# Patient Record
Sex: Female | Born: 1960
Health system: Southern US, Community
[De-identification: ages and names within clinical notes are randomized; demographics above are authoritative.]

## PROBLEM LIST (undated history)

## (undated) DIAGNOSIS — M1990 Unspecified osteoarthritis, unspecified site: Secondary | ICD-10-CM

## (undated) DIAGNOSIS — Z5189 Encounter for other specified aftercare: Secondary | ICD-10-CM

## (undated) DIAGNOSIS — N951 Menopausal and female climacteric states: Secondary | ICD-10-CM

## (undated) DIAGNOSIS — K219 Gastro-esophageal reflux disease without esophagitis: Secondary | ICD-10-CM

## (undated) DIAGNOSIS — F329 Major depressive disorder, single episode, unspecified: Secondary | ICD-10-CM

## (undated) DIAGNOSIS — F32A Depression, unspecified: Secondary | ICD-10-CM

## (undated) DIAGNOSIS — C801 Malignant (primary) neoplasm, unspecified: Secondary | ICD-10-CM

## (undated) DIAGNOSIS — G459 Transient cerebral ischemic attack, unspecified: Secondary | ICD-10-CM

## (undated) DIAGNOSIS — A630 Anogenital (venereal) warts: Secondary | ICD-10-CM

## (undated) DIAGNOSIS — R51 Headache: Secondary | ICD-10-CM

## (undated) DIAGNOSIS — K5909 Other constipation: Secondary | ICD-10-CM

## (undated) DIAGNOSIS — R896 Abnormal cytological findings in specimens from other organs, systems and tissues: Secondary | ICD-10-CM

## (undated) DIAGNOSIS — J439 Emphysema, unspecified: Secondary | ICD-10-CM

## (undated) DIAGNOSIS — IMO0001 Reserved for inherently not codable concepts without codable children: Secondary | ICD-10-CM

## (undated) DIAGNOSIS — R918 Other nonspecific abnormal finding of lung field: Secondary | ICD-10-CM

## (undated) DIAGNOSIS — M719 Bursopathy, unspecified: Secondary | ICD-10-CM

## (undated) DIAGNOSIS — B977 Papillomavirus as the cause of diseases classified elsewhere: Secondary | ICD-10-CM

## (undated) DIAGNOSIS — J449 Chronic obstructive pulmonary disease, unspecified: Secondary | ICD-10-CM

## (undated) DIAGNOSIS — T7840XA Allergy, unspecified, initial encounter: Secondary | ICD-10-CM

## (undated) DIAGNOSIS — F419 Anxiety disorder, unspecified: Secondary | ICD-10-CM

## (undated) DIAGNOSIS — Z1501 Genetic susceptibility to malignant neoplasm of breast: Secondary | ICD-10-CM

## (undated) DIAGNOSIS — R569 Unspecified convulsions: Secondary | ICD-10-CM

## (undated) DIAGNOSIS — M8580 Other specified disorders of bone density and structure, unspecified site: Secondary | ICD-10-CM

## (undated) DIAGNOSIS — I639 Cerebral infarction, unspecified: Secondary | ICD-10-CM

## (undated) HISTORY — DX: Malignant (primary) neoplasm, unspecified: C80.1

## (undated) HISTORY — DX: Depression, unspecified: F32.A

## (undated) HISTORY — DX: Papillomavirus as the cause of diseases classified elsewhere: B97.7

## (undated) HISTORY — PX: CHOLECYSTECTOMY: SHX55

## (undated) HISTORY — DX: Bursopathy, unspecified: M71.9

## (undated) HISTORY — DX: Abnormal cytological findings in specimens from other organs, systems and tissues: R89.6

## (undated) HISTORY — DX: Reserved for inherently not codable concepts without codable children: IMO0001

## (undated) HISTORY — PX: ABDOMINOPLASTY: SUR9

## (undated) HISTORY — PX: TOTAL VAGINAL HYSTERECTOMY: SHX2548

## (undated) HISTORY — DX: Gastro-esophageal reflux disease without esophagitis: K21.9

## (undated) HISTORY — DX: Other nonspecific abnormal finding of lung field: R91.8

## (undated) HISTORY — DX: Menopausal and female climacteric states: N95.1

## (undated) HISTORY — PX: ABDOMINAL HYSTERECTOMY: SHX81

## (undated) HISTORY — DX: Anogenital (venereal) warts: A63.0

## (undated) HISTORY — DX: Other constipation: K59.09

## (undated) HISTORY — PX: HERNIA REPAIR: SHX51

## (undated) HISTORY — DX: Allergy, unspecified, initial encounter: T78.40XA

## (undated) HISTORY — DX: Transient cerebral ischemic attack, unspecified: G45.9

## (undated) HISTORY — DX: Other specified disorders of bone density and structure, unspecified site: M85.80

## (undated) HISTORY — DX: Cerebral infarction, unspecified: I63.9

## (undated) HISTORY — DX: Chronic obstructive pulmonary disease, unspecified: J44.9

## (undated) HISTORY — DX: Emphysema, unspecified: J43.9

## (undated) HISTORY — DX: Major depressive disorder, single episode, unspecified: F32.9

## (undated) HISTORY — PX: BREAST SURGERY: SHX581

---
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
## 1981-04-16 DIAGNOSIS — Z5189 Encounter for other specified aftercare: Secondary | ICD-10-CM

## 1981-04-16 DIAGNOSIS — IMO0001 Reserved for inherently not codable concepts without codable children: Secondary | ICD-10-CM

## 1981-04-16 HISTORY — DX: Reserved for inherently not codable concepts without codable children: IMO0001

## 1981-04-16 HISTORY — DX: Encounter for other specified aftercare: Z51.89

## 2003-08-05 ENCOUNTER — Encounter: Payer: Self-pay | Admitting: Family Medicine

## 2004-11-06 ENCOUNTER — Encounter: Payer: Self-pay | Admitting: Family Medicine

## 2005-04-19 ENCOUNTER — Encounter: Payer: Self-pay | Admitting: Family Medicine

## 2007-01-16 ENCOUNTER — Encounter: Payer: Self-pay | Admitting: Family Medicine

## 2007-05-13 ENCOUNTER — Encounter: Payer: Self-pay | Admitting: Family Medicine

## 2007-07-07 ENCOUNTER — Encounter: Payer: Self-pay | Admitting: Family Medicine

## 2007-07-16 ENCOUNTER — Ambulatory Visit (HOSPITAL_COMMUNITY): Payer: Self-pay | Admitting: Psychiatry

## 2007-09-10 ENCOUNTER — Other Ambulatory Visit: Admission: RE | Admit: 2007-09-10 | Discharge: 2007-09-10 | Payer: Self-pay | Admitting: Obstetrics & Gynecology

## 2007-09-10 ENCOUNTER — Ambulatory Visit: Payer: Self-pay | Admitting: Obstetrics & Gynecology

## 2007-09-10 ENCOUNTER — Encounter: Admission: RE | Admit: 2007-09-10 | Discharge: 2007-09-10 | Payer: Self-pay | Admitting: Obstetrics & Gynecology

## 2007-09-10 ENCOUNTER — Encounter: Payer: Self-pay | Admitting: Obstetrics & Gynecology

## 2007-09-19 ENCOUNTER — Encounter: Admission: RE | Admit: 2007-09-19 | Discharge: 2007-09-19 | Payer: Self-pay | Admitting: Obstetrics & Gynecology

## 2007-11-05 ENCOUNTER — Ambulatory Visit: Payer: Self-pay | Admitting: Obstetrics & Gynecology

## 2007-12-16 ENCOUNTER — Ambulatory Visit: Payer: Self-pay | Admitting: Family Medicine

## 2007-12-16 ENCOUNTER — Encounter: Admission: RE | Admit: 2007-12-16 | Discharge: 2007-12-16 | Payer: Self-pay | Admitting: Family Medicine

## 2007-12-16 DIAGNOSIS — E785 Hyperlipidemia, unspecified: Secondary | ICD-10-CM | POA: Insufficient documentation

## 2007-12-16 DIAGNOSIS — R609 Edema, unspecified: Secondary | ICD-10-CM | POA: Insufficient documentation

## 2007-12-16 DIAGNOSIS — M79609 Pain in unspecified limb: Secondary | ICD-10-CM | POA: Insufficient documentation

## 2007-12-17 ENCOUNTER — Ambulatory Visit: Payer: Self-pay | Admitting: Obstetrics & Gynecology

## 2007-12-17 ENCOUNTER — Encounter: Payer: Self-pay | Admitting: Family Medicine

## 2007-12-17 LAB — CONVERTED CEMR LAB
AST: 12 units/L (ref 0–37)
Alkaline Phosphatase: 50 units/L (ref 39–117)
BUN: 11 mg/dL (ref 6–23)
Chloride: 110 meq/L (ref 96–112)
Creatinine, Ser: 0.72 mg/dL (ref 0.40–1.20)
HDL: 52 mg/dL (ref >39)
Hemoglobin: 14.5 g/dL (ref 12.0–15.0)
Potassium: 3.8 meq/L (ref 3.5–5.3)
Pro B Natriuretic peptide (BNP): 15 pg/mL (ref 0.0–100.0)
RBC: 4.34 M/uL (ref 3.87–5.11)
Sed Rate: 2 mm/hr (ref 0–22)
TSH: 1.268 microintl units/mL (ref 0.350–4.50)
Total CHOL/HDL Ratio: 4
Triglycerides: 147 mg/dL (ref <150)
VLDL: 29 mg/dL (ref 0–40)
Vit D, 1,25-Dihydroxy: 46 (ref 30–89)

## 2007-12-24 ENCOUNTER — Ambulatory Visit (HOSPITAL_COMMUNITY): Admission: RE | Admit: 2007-12-24 | Discharge: 2007-12-24 | Payer: Self-pay | Admitting: Family Medicine

## 2007-12-24 ENCOUNTER — Telehealth: Payer: Self-pay | Admitting: Family Medicine

## 2007-12-24 ENCOUNTER — Ambulatory Visit: Payer: Self-pay | Admitting: Vascular Surgery

## 2007-12-24 ENCOUNTER — Encounter: Payer: Self-pay | Admitting: Family Medicine

## 2008-01-15 ENCOUNTER — Encounter: Payer: Self-pay | Admitting: Family Medicine

## 2008-01-27 DIAGNOSIS — K5909 Other constipation: Secondary | ICD-10-CM | POA: Insufficient documentation

## 2008-01-27 DIAGNOSIS — F319 Bipolar disorder, unspecified: Secondary | ICD-10-CM | POA: Insufficient documentation

## 2008-01-27 DIAGNOSIS — F509 Eating disorder, unspecified: Secondary | ICD-10-CM | POA: Insufficient documentation

## 2008-01-27 DIAGNOSIS — G4700 Insomnia, unspecified: Secondary | ICD-10-CM | POA: Insufficient documentation

## 2008-02-12 ENCOUNTER — Encounter: Payer: Self-pay | Admitting: Family Medicine

## 2008-02-26 ENCOUNTER — Telehealth: Payer: Self-pay | Admitting: Family Medicine

## 2008-03-02 ENCOUNTER — Ambulatory Visit: Payer: Self-pay | Admitting: Family Medicine

## 2008-03-02 DIAGNOSIS — G43909 Migraine, unspecified, not intractable, without status migrainosus: Secondary | ICD-10-CM | POA: Insufficient documentation

## 2008-09-14 ENCOUNTER — Encounter: Admission: RE | Admit: 2008-09-14 | Discharge: 2008-09-14 | Payer: Self-pay | Admitting: Family Medicine

## 2009-05-11 ENCOUNTER — Ambulatory Visit: Payer: Self-pay | Admitting: Obstetrics & Gynecology

## 2009-05-12 ENCOUNTER — Encounter: Payer: Self-pay | Admitting: Obstetrics & Gynecology

## 2009-05-12 LAB — CONVERTED CEMR LAB
Chlamydia, DNA Probe: NEGATIVE
FSH: 51.7 milliintl units/mL
Trich, Wet Prep: NONE SEEN
WBC, Wet Prep HPF POC: NONE SEEN

## 2009-05-24 ENCOUNTER — Ambulatory Visit: Payer: Self-pay | Admitting: Obstetrics & Gynecology

## 2009-05-24 ENCOUNTER — Other Ambulatory Visit: Admission: RE | Admit: 2009-05-24 | Discharge: 2009-05-24 | Payer: Self-pay | Admitting: Obstetrics & Gynecology

## 2009-06-01 ENCOUNTER — Ambulatory Visit: Payer: Self-pay | Admitting: Obstetrics & Gynecology

## 2009-06-14 ENCOUNTER — Ambulatory Visit: Payer: Self-pay | Admitting: Obstetrics & Gynecology

## 2009-10-18 ENCOUNTER — Encounter: Admission: RE | Admit: 2009-10-18 | Discharge: 2009-10-18 | Payer: Self-pay | Admitting: Family Medicine

## 2009-11-14 ENCOUNTER — Encounter: Admission: RE | Admit: 2009-11-14 | Discharge: 2009-11-14 | Payer: Self-pay | Admitting: Orthopedic Surgery

## 2009-11-29 ENCOUNTER — Ambulatory Visit: Payer: Self-pay | Admitting: Obstetrics & Gynecology

## 2010-05-07 ENCOUNTER — Encounter: Payer: Self-pay | Admitting: Orthopedic Surgery

## 2010-08-22 ENCOUNTER — Ambulatory Visit (INDEPENDENT_AMBULATORY_CARE_PROVIDER_SITE_OTHER): Payer: PRIVATE HEALTH INSURANCE | Admitting: Obstetrics & Gynecology

## 2010-08-22 DIAGNOSIS — N899 Noninflammatory disorder of vagina, unspecified: Secondary | ICD-10-CM

## 2010-08-23 ENCOUNTER — Other Ambulatory Visit: Payer: Self-pay | Admitting: Obstetrics & Gynecology

## 2010-08-23 ENCOUNTER — Ambulatory Visit (INDEPENDENT_AMBULATORY_CARE_PROVIDER_SITE_OTHER): Payer: PRIVATE HEALTH INSURANCE | Admitting: Obstetrics & Gynecology

## 2010-08-23 DIAGNOSIS — A630 Anogenital (venereal) warts: Secondary | ICD-10-CM

## 2010-08-25 NOTE — Assessment & Plan Note (Signed)
NAMESALLI, BODIN              ACCOUNT NO.:  [id number]  MEDICAL RECORD NO.:  [id number]           PATIENT TYPE:  LOCATION:  [facility] [place]           FACILITY:  PHYSICIAN:  Allie Bossier, MD        DATE OF BIRTH:  01-28-1961  DATE OF SERVICE:  08/22/2010                                 CLINIC NOTE  Ms. Adamek is a 50 year old lady who comes in here today with a 2-day history of vaginal itching, she used Monistat last night.  PAST MEDICAL HISTORY:  Depression/anxiety.  She is postmenopausal. Please note that Dr. Penne Lash diagnosed her with genital warts and applied TCA a year ago.  PHYSICAL EXAMINATION:  On exam today, I do not necessarily note that these are that the lesion she has on her vulva are warts.  They appeared more to be like a squamous papilloma to me, but Ms. Neyer would very much like to have them examined under the microscope (biopsied) to know for sure what they are.  In addition, my exam revealed nothing in her vagina or vulva other than atrophy.  I did do a wet prep and I did urine sample for GC and Chlamydia.  I suspect, however, that what she has is atrophic vaginitis, but I will see her back tomorrow with the results of her wet prep and at the same time, biopsy/remove one of the vulvar lesions for a specific diagnosis.     Allie Bossier, MD    MCD/MEDQ  D:  08/22/2010  T:  08/23/2010  Job:  [id number]

## 2010-08-25 NOTE — Assessment & Plan Note (Signed)
NAMEELCIE, PELSTER              ACCOUNT NO.:  [id number]  MEDICAL RECORD NO.:  [id number]           PATIENT TYPE:  LOCATION:  [facility] [place]           FACILITY:  PHYSICIAN:  Allie Bossier, MD        DATE OF BIRTH:  1961-04-16  DATE OF SERVICE:  08/23/2010                                 CLINIC NOTE  Ms. Burdett is a 50 year old lady who was seen here yesterday with a 2- day history of vaginal itching.  At that time, I did a wet prep.  Her vaginal discharge appeared scant and normal and I suspected atrophic vaginitis.  Her wet prep today comes back completely normal.  I have recommended that she use some over-the-counter personal lubricant on a p.r.n. basis.  When she was seen here yesterday, she told me that she had been treated with TCA for vulvar warts.  On exam, I did not feel the lesions present were necessarily condyloma, so she wanted to a definitive diagnosis.  Today, she comes in for a vulvar biopsy.  I prepped the area with the abnormal skin lesion on her vulva with iodine and then a drop of 1% lidocaine.  I then elevated one of the typical lesions and excised it.  I used silver nitrate to achieve excellent hemostasis and we will call her with these results.  She tolerated the procedure well.     Allie Bossier, MD    MCD/MEDQ  D:  08/23/2010  T:  08/23/2010  Job:  [id number]

## 2010-08-29 NOTE — Assessment & Plan Note (Signed)
Erin [name], Erin [name]              ACCOUNT NO.:  [id number]   MEDICAL RECORD NO.:  [id number]          PATIENT TYPE:  POB   LOCATION:  [facility] [place]         FACILITY:  [facility]   PHYSICIAN:  Elsie Lincoln, MD      DATE OF BIRTH:  1961-02-22   DATE OF SERVICE:                                  CLINIC NOTE   The patient is a 50 year old female who presents for followup for TCA  application.  She had about a 75-80% response to the first application,  this will be the last application.  The TCA was applied without  incident.  We reviewed her chart.  She is due for a mammogram in June  2011, this will be ordered.  We also reviewed that her mother got breast  cancer in her 59s, but died in her 48s.  So, I think she qualifies for  BRCA 1 and 2 gene testing.  We will call the representative and see if  Medicaid pays for this test.  Vernona Rieger will call the patient back.  The  patient understands this.  The patient is up to date on Pap smear and we  will not be due in a year.  She had an abnormal Pap smear in 2009, but  she was normal in 2011.  The patient is requesting a refill on her  Xanax.  I told her this is not in normal what we due for as a  gynecologist.  Also given that she has some psychological conditions and  she needs to go back to her primary care doctor, she will make an  appointment.  I will give her 1 month prescription, so she can get into  her doctor's office.  She understands this is a 1 month prescription  only.   ASSESSMENT AND PLAN:  A 50 year old female with genital warts that are  resolved and TCA.   1. TCA applied today.  2. BRCA 1 and 2 testing will be investigated.  3. Mammogram ordered for June 2011.  4. Xanax prescription given for 1 month as the patient is going to      make appointment with primary care doctor.           ______________________________  Elsie Lincoln, MD     KL/MEDQ  D:  06/14/2009  T:  06/15/2009  Job:  [id number]

## 2010-08-29 NOTE — Assessment & Plan Note (Signed)
Erin [name], Erin [name]              ACCOUNT NO.:  [id number]   MEDICAL RECORD NO.:  [id number]          PATIENT TYPE:  POB   LOCATION:  [facility] [place]         FACILITY:  [facility]   PHYSICIAN:  Elsie Lincoln, MD      DATE OF BIRTH:  1960-06-08   DATE OF SERVICE:  11/29/2009                                  CLINIC NOTE   The patient is a 50 year old female who presents for complaining of  menopausal, hot flashes.  She also wants to get lose more weight and she  wants a referral to a gastroenterologist for colonoscopy.  First of all,  I have explained to her that I do not prescribe weight loss medication  but will refer her to a nutritionist to see if she can change her eating  habits to help her lose weight.  I did tell her that she was of normal  weight for her height.  She is 145 pounds and 5 feet 7 inches.  As for  her menopausal issues, she is on a dose of Premarin at 1.25 mg a day,  they discontinued the 2.5 mg a day.  After looking it up to date, we  could try the Vivelle-Dot 0.1 mg patch, which is the highest days to  hopefully avoid the first patch effect of the liver beginning  transdermally.  If this does not work, we can add an Effexor at 75 mg  once a day.  If that does not work, we could have her stop entirely so  she can see what it is like to be on hormone replacement and without  hormone replacement and maybe she will be thankful for what relief she  does get.  We also did check a TSH, maybe she is hyperthyroid and  finally she says she is having rectal bleeding, and I think a referral  for colonoscopy is warranted.  The patient is up-to-date on other  issues.  She has refused BRCA testing still for this day.  The patient  will come back in 6 months, see how she is due with her hot flashes.           ______________________________  Elsie Lincoln, MD     KL/MEDQ  D:  11/29/2009  T:  11/30/2009  Job:  [id number]

## 2010-08-29 NOTE — Assessment & Plan Note (Signed)
NAMESHALLYN, CONSTANCIO              ACCOUNT NO.:  [id number]   MEDICAL RECORD NO.:  [id number]          PATIENT TYPE:  POB   LOCATION:  [facility] [place]         FACILITY:  [facility]   PHYSICIAN:  Allie Bossier, MD        DATE OF BIRTH:  Sep 12, 1960   DATE OF SERVICE:  09/10/2007                                  CLINIC NOTE   Erin [name] is a 50 year old single white lady of German[place] descent who is  here for several complaints.  Her main complaint is that of menopausal  symptoms.  She has been experiencing debilitating night sweats, hot  flashes, mood swings, and crying spells for the last 4 months.  She had  a hysterectomy at age 39 for cervical cancer and therefore, has not  menstruated since then.  She has been doing research on compounding and  testosterone and estrogen.  She would like her testosterone level and  her estrogen level checked.  She is interested in having these 2  hormones replaced via creams.  Her second complaint is today of chronic  vulvar/vaginal itching.  She uses a MetroGel cream from her previous  family doctor (Dr. Su Grand) on a periodic basis.  From my discussion with  her, it seems perhaps she uses it approximately once every other month.  She also complains of dysuria once her urinalysis is done.  Urinalysis  today was negative, and I will send it for a culture.   PAST MEDICAL HISTORY:  Chronic constipation, she states I am laxative  dependent.  Bursitis.  Depression, she sees a psychiatrist in [place], but  she is unable to give me his name.   PAST SURGICAL HISTORY:  She has had a tummy tuck.  She had a vaginal  hysterectomy at 50 years of age secondary to cervical cancer.  No  followup treatment has been necessary.  Left breast biopsy.  She had a  lipoma removed.   FAMILY HISTORY:  Positive for breast cancer in her mother, but negative  for GYN and colon malignancies.   ALLERGIES:  No latex allergies.  No known drug allergies.   SOCIAL HISTORY:  She smokes a  pack a day for the last 40 years.  She  reports rare alcohol use.  She denies illegal drug use now, but she  states she quit using street drugs 2 years ago.  She states that she  has used marijuana and cocaine.   REVIEW OF SYSTEMS:  Her Pap smear was done in November 2008 by Dr.  Su Grand.  She states this was normal.  She states her mammogram is due  currently, and she had a colonoscopy this year in 2009, that was normal.   MEDICATIONS:  She takes Lamictal, MetroGel cream p.r.n., Seroquel,  Klonopin, nabumetone, and Dulcolax suppositories daily.   PHYSICAL EXAMINATION:  VITAL SIGNS:  Stable.  She is afebrile.  Weight  124 pounds, height 5 feet 7 inches, blood pressure 90/50, pulse 76.  GENERAL:  She seems rather anxious.  EXTERNAL GENITALIA:  Shaved completely.  There are no lesions.  There is  no odor.  In the vaginal vault, there is a creamy white discharge (she  denies any  recent sexual activity or cream use).  This was sent for a  wet prep.  I did obtain a Pap smear per her request.  Bimanual exam was  normal.  There were no masses, and she was nontender.   ASSESSMENT AND PLAN:  Menopausal symptoms.  Per her request, I have  ordered a TSH, FSH.  With regards to the chronic itching, I have sent an  HSV-2, IgG, and a wet prep.  Free testosterone was also  ordered.  I  will have her back in 1-2 weeks to go over these lab results and treat  her appropriately.  I have also gotten a mammogram scheduled.      Allie Bossier, MD     MCD/MEDQ  D:  09/10/2007  T:  09/11/2007  Job:  [id number]

## 2010-08-29 NOTE — Assessment & Plan Note (Signed)
Erin [name], Erin [name]              ACCOUNT NO.:  [id number]   MEDICAL RECORD NO.:  [id number]          PATIENT TYPE:  POB   LOCATION:  [facility] [place]         FACILITY:  [facility]   PHYSICIAN:  Allie Bossier, MD        DATE OF BIRTH:  1960/05/13   DATE OF SERVICE:                                  CLINIC NOTE   Erin [name] had unprotected sex around 3 weeks ago.  Since then she is  complaining of a discharge and very sore breast.  She says that she has  not been able to where a bra for 3 days because of the breast  discomfort.  On breast exam, she has no adenopathy of her axilla.  The  point tenderness is right over the lateral aspect of her rib cage (not  the actual breast tissue ).  On pelvic exam in spite of her complaint of  a discharge, I do not see discharge in her atrophic vagina.  I did send  a wet prep, however.   ASSESSMENT AND PLAN:  1. Unprotected sex, possible sexually transmitted disease exposure,      getting GC and chlamydia cultures off her urine (she has in her      cervix).  2. I will run human immunodeficiency virus test today and told her      that 6-week follow up would be advisable.  I am checking a wet prep      for evaluation for discharge.  Recommended Tylenol and Motrin      (800 mg 3 times a day as necessary) and she will follow up as      above.  I have suggested that in the future she use condoms.      Allie Bossier, MD     MCD/MEDQ  D:  12/17/2007  T:  12/18/2007  Job:  [id number]

## 2010-08-29 NOTE — Assessment & Plan Note (Signed)
NAME:  Erin [name], Erin [name] NO.:  [id number]   MEDICAL RECORD NO.:  [id number]          PATIENT TYPE:  POB   LOCATION:  [facility] [place]         FACILITY:  [facility]   PHYSICIAN:  Elsie Lincoln, MD      DATE OF BIRTH:  07-07-1960   DATE OF SERVICE:                                  CLINIC NOTE   The patient is a 50 year old female who presents for annual exam.   PAST MEDICAL HISTORY:  Significant for depression, now resolved;  bursitis, that is better; chronic constipation, it is now better.   PAST SURGICAL HISTORY:  Had a tummy tuck, a vaginal hysterectomy for  cervical cancer.  She has had an ASCUS Pap smear here in 2009 that was  HPV positive, but did not come followup.  She has also had a left breast  biopsy and a lipoma removed.   FAMILY HISTORY:  Positive for postmenopausal breast cancer in her  mother, but negative for uterine cancer, ovarian cancer and colon  cancer.   ALLERGIES:  No latex or no drug allergies.   SOCIAL HISTORY:  She still smokes.  She reports no alcohol use.  She  does smoke occasional marijuana, but denies any cocaine or other street  drugs.   MEDICATIONS:  Now Soma, Xanax, Skelaxin, tramadol and Premarin.   GYNECOLOGIC HISTORY:  She is still having severe hot flashes on Premarin  and she wants to increase her dose.  She has had 2 vaginal deliveries.  She does have vaginal warts and is interested in getting these removed  with acid.  She does have urinary incontinence and so we referred to  Urology, but has not gone yet.   REVIEW OF SYSTEMS:  Positive for urinary incontinence, hot flashes.   PHYSICAL EXAMINATION:  VITAL SIGNS:  Pulse 79, blood pressure 120/79,  weight 148, height 67 inches.  GENERAL:  Well nourished, well developed, no apparent distress.  HEENT:  Normocephalic, atraumatic.  Good dentition.  Thyroid, no masses.  LUNGS:  Clear to auscultation bilaterally.  HEART:  Regular rate and rhythm.  BREASTS:  No masses.  No  nipple discharge.  No lymphadenopathy.  ABDOMEN:  Soft, nontender.  No organomegaly.  No hernia.  PELVIC:  Genitalia, [name] V.  Vagina, slightly atrophic.  Vaginal vault  intact.  No gross masses.  Uterus surgically absent.  Tiny ovaries felt,  nontender.  Rectovaginal, no masses, no cystocele, no rectocele.  EXTREMITIES:  Nontender.   ASSESSMENT AND PLAN:  A 50 year old female for well-woman exam.  1. Pap smear.  2. Genital warts which the patient will return for trichloroacetic      acid application.  3. The patient is given risks of hormone replacement therapy.  The      patient opts to continue hormone replacement therapy and actually      want to increase her dose because she is suffering so badly from      her current hot flashes, especially at night.  She wakes up 3-4      times at night and she finds this unbearable.  The patient      understands with increased risk of breast cancer and if breast  cancer does occur from the hormone replacement therapy, there is      increased risk of possible lymph nodes and death from the breast      cancer.  4. Urinary incontinence with referral to Urology.  5. The patient's partner is likely not monogamous and she is      considering breaking up with him.  6. Return to clinic for the TCA.           ______________________________  Elsie Lincoln, MD     KL/MEDQ  D:  05/24/2009  T:  05/25/2009  Job:  [id number]

## 2010-08-29 NOTE — Assessment & Plan Note (Signed)
Erin [name], TAKEMOTO              ACCOUNT NO.:  [id number]   MEDICAL RECORD NO.:  [id number]          PATIENT TYPE:  POB   LOCATION:  [facility] [place]         FACILITY:  [facility]   PHYSICIAN:  Elsie Lincoln, MD      DATE OF BIRTH:  [birth year]   DATE OF SERVICE:  05/11/2009                                  CLINIC NOTE   The patient is a 50 year old female with multiple problems.  1. She complains of bladder leaking when she laughs, coughs and      sneezes.  She also complains of urgency but only urinates a couple      of drops.  2. She complains of hot flashes.  3. She complains of bumps on her labia majora.  4. She complains of fishy odor after sex and yeast itching.  She is      also overdue for a Pap smear.  She was supposed to come back in      December 2009, for ASCUS positive HPV.  Pap smear that had nothing      on colposcopy.  Of note, she did have a hysterectomy for what      sounds like dysplasia.  I told that she has multiple issues but we      will deal with only a few today and she will need to come back for      her yearly exam next week.  For her bladder leaking, we sent a      urinalysis which was normal.  Her urine culture is pending for the      day.   On physical exam, she is markedly tender over her bladder.  She may have  a picture of interstitial cystitis as well as some mixed incontinence  issues.  I think that this requires a Urology referral.  Given the  complexity of the issue, I referred her to Wilburn Mylar who comes  to [place].  1. She has hot flashes.  __________ menopause.  I will order [facility]      today.  2. Her vaginal bumps on physical exam.  She has areas on her perineum      consistent with warts.  Given that she is a HPV carrier this is not      surprising.  I explained that we can do TCA of these and we can      also biopsy one if necessary.  We will do this at a later date.      The patient verbalized understanding.  3. Wet prep  was done today and she was treated presumptively for      bacterial vaginosis with Flagyl 500 mg 1 tablet p.o. b.i.d. and      also yeast.  4. The patient will come back next week for a full history and      physical and her yearly exam so that we can delve into more of her      medical needs and do a Pap smear.  She is fortunately up-to-date on      her mammogram.           ______________________________  Elsie Lincoln, MD  KL/MEDQ  D:  05/11/2009  T:  05/12/2009  Job:  [id number]

## 2010-10-12 ENCOUNTER — Other Ambulatory Visit: Payer: Self-pay | Admitting: Obstetrics & Gynecology

## 2010-10-12 DIAGNOSIS — N63 Unspecified lump in unspecified breast: Secondary | ICD-10-CM

## 2010-10-12 DIAGNOSIS — Z780 Asymptomatic menopausal state: Secondary | ICD-10-CM

## 2010-10-19 ENCOUNTER — Ambulatory Visit
Admission: RE | Admit: 2010-10-19 | Discharge: 2010-10-19 | Disposition: A | Discharge status: Home / Self Care | Payer: PRIVATE HEALTH INSURANCE | Source: Ambulatory Visit | Attending: Obstetrics & Gynecology | Admitting: Obstetrics & Gynecology

## 2010-10-19 DIAGNOSIS — Z780 Asymptomatic menopausal state: Secondary | ICD-10-CM

## 2010-10-19 DIAGNOSIS — N63 Unspecified lump in unspecified breast: Secondary | ICD-10-CM

## 2010-10-20 ENCOUNTER — Other Ambulatory Visit: Payer: Self-pay | Admitting: Obstetrics & Gynecology

## 2010-10-20 DIAGNOSIS — Z1501 Genetic susceptibility to malignant neoplasm of breast: Secondary | ICD-10-CM

## 2010-10-20 DIAGNOSIS — Z803 Family history of malignant neoplasm of breast: Secondary | ICD-10-CM

## 2010-11-02 ENCOUNTER — Other Ambulatory Visit: Payer: PRIVATE HEALTH INSURANCE

## 2010-11-06 ENCOUNTER — Encounter: Payer: Self-pay | Admitting: Emergency Medicine

## 2010-11-08 ENCOUNTER — Other Ambulatory Visit (INDEPENDENT_AMBULATORY_CARE_PROVIDER_SITE_OTHER): Payer: PRIVATE HEALTH INSURANCE

## 2010-11-08 DIAGNOSIS — Z1239 Encounter for other screening for malignant neoplasm of breast: Secondary | ICD-10-CM

## 2010-11-29 ENCOUNTER — Ambulatory Visit (INDEPENDENT_AMBULATORY_CARE_PROVIDER_SITE_OTHER): Payer: PRIVATE HEALTH INSURANCE | Admitting: Obstetrics & Gynecology

## 2010-11-29 ENCOUNTER — Encounter: Payer: Self-pay | Admitting: *Deleted

## 2010-11-29 DIAGNOSIS — Z9189 Other specified personal risk factors, not elsewhere classified: Secondary | ICD-10-CM

## 2010-11-29 NOTE — Progress Notes (Signed)
  Subjective:    Patient ID: Erin [name], female    DOB: 10/29/1960, 50 y.o.   MRN: [id number]  HPI  Erin [name] is a 50 year old lady with a strong family history of breast cancer. Her daughter was diagnosed and treated for breast cancer in her 65s, her mother in her 81s, and her maternal aunt in her 53s. Recently, her BRCA2 came back positive.  Review of Systems   Her insurance refused to approve on ordered MRI recently (prior to positive BRCA 2 results). Objective:   Physical Exam   deferred     Assessment & Plan:  Positive BRCA 2. I have recommended laparoscopic BSO in order to decrease her risk of ovarian cancer. She would like to have this done no sooner than October. I am fine with that. The secretary will again speak with the insurance company about approving the MRI now that her BRCA2 is known to be positive. After those results are available, I would recommend that she consult with a general surgeon regarding possible bilateral prophylactic mastectomy. She was given written information regarding all things discussed.

## 2010-12-10 ENCOUNTER — Other Ambulatory Visit: Payer: PRIVATE HEALTH INSURANCE

## 2010-12-19 ENCOUNTER — Ambulatory Visit (HOSPITAL_COMMUNITY)
Admission: RE | Admit: 2010-12-19 | Discharge: 2010-12-19 | Disposition: A | Discharge status: Home / Self Care | Payer: PRIVATE HEALTH INSURANCE | Source: Ambulatory Visit | Attending: Obstetrics & Gynecology | Admitting: Obstetrics & Gynecology

## 2010-12-19 DIAGNOSIS — Z1501 Genetic susceptibility to malignant neoplasm of breast: Secondary | ICD-10-CM

## 2010-12-19 DIAGNOSIS — Z1509 Genetic susceptibility to other malignant neoplasm: Secondary | ICD-10-CM

## 2010-12-19 DIAGNOSIS — Z803 Family history of malignant neoplasm of breast: Secondary | ICD-10-CM | POA: Insufficient documentation

## 2010-12-19 MED ORDER — GADOBENATE DIMEGLUMINE 529 MG/ML IV SOLN
15.0000 mL | Freq: Once | INTRAVENOUS | Status: AC | PRN
  Administered 2010-12-19: 15 mL via INTRAVENOUS

## 2010-12-25 MED ORDER — LIDOCAINE-EPINEPHRINE (PF) 2 %-1:200000 IJ SOLN
INTRAMUSCULAR | Status: AC
  Filled 2010-12-25: qty 20

## 2010-12-25 MED ORDER — CEFAZOLIN SODIUM 1-5 GM-% IV SOLN
INTRAVENOUS | Status: AC
  Filled 2010-12-25: qty 50

## 2010-12-25 MED ORDER — SODIUM BICARBONATE 8.4 % IV SOLN
INTRAVENOUS | Status: AC
  Filled 2010-12-25: qty 50

## 2010-12-25 MED ORDER — PHENYLEPHRINE 40 MCG/ML (10ML) SYRINGE FOR IV PUSH (FOR BLOOD PRESSURE SUPPORT)
PREFILLED_SYRINGE | INTRAVENOUS | Status: AC
  Filled 2010-12-25: qty 5

## 2010-12-25 MED ORDER — ONDANSETRON HCL 4 MG/2ML IJ SOLN
INTRAMUSCULAR | Status: AC
  Filled 2010-12-25: qty 2

## 2010-12-25 MED ORDER — MORPHINE SULFATE 0.5 MG/ML IJ SOLN
INTRAMUSCULAR | Status: AC
  Filled 2010-12-25: qty 10

## 2010-12-25 MED ORDER — OXYTOCIN 10 UNIT/ML IJ SOLN
INTRAMUSCULAR | Status: AC
  Filled 2010-12-25: qty 2

## 2011-01-02 ENCOUNTER — Encounter (HOSPITAL_COMMUNITY): Payer: Self-pay

## 2011-01-02 ENCOUNTER — Encounter (HOSPITAL_COMMUNITY)
Admission: RE | Admit: 2011-01-02 | Discharge: 2011-01-02 | Disposition: A | Discharge status: Home / Self Care | Payer: PRIVATE HEALTH INSURANCE | Source: Ambulatory Visit | Attending: Obstetrics & Gynecology | Admitting: Obstetrics & Gynecology

## 2011-01-02 HISTORY — DX: Anxiety disorder, unspecified: F41.9

## 2011-01-02 HISTORY — DX: Unspecified convulsions: R56.9

## 2011-01-02 HISTORY — DX: Headache: R51

## 2011-01-02 HISTORY — DX: Unspecified osteoarthritis, unspecified site: M19.90

## 2011-01-02 HISTORY — DX: Encounter for other specified aftercare: Z51.89

## 2011-01-02 LAB — CBC
HCT: 37 % (ref 36.0–46.0)
MCV: 95.6 fL (ref 78.0–100.0)
RBC: 3.87 MIL/uL (ref 3.87–5.11)
WBC: 7.2 10*3/uL (ref 4.0–10.5)

## 2011-01-02 LAB — SURGICAL PCR SCREEN: Staphylococcus aureus: NEGATIVE

## 2011-01-02 NOTE — Patient Instructions (Addendum)
   Your procedure is scheduled on:01/04/11  Enter through the Main Entrance of [facility] at:1130 am Pick up the phone at the desk and dial [phone]  Please call this number if you have any problems the morning of surgery: [phone]  Remember: Do not eat food after midnight  Do not drink clear liquids after: Take these medicines the morning of surgery with a SIP OF WATER:  Do not wear jewelry, make-up, or FINGER nail polish Do not wear lotions, powders, or perfumes. Do not shave 48 hours prior to surgery. Do not bring valuables to the hospital. Leave suitcase in the car. After Surgery it may be brought to your room. For patients being admitted to the hospital, checkout time is 11:00am the day of discharge.  Patients discharged on the day of surgery will not be allowed to drive home.   Name and phone number of your driver:Christina- [phone]   Remember to use your hibiclens as instructed.

## 2011-01-03 ENCOUNTER — Encounter (HOSPITAL_COMMUNITY): Payer: Self-pay | Admitting: Anesthesiology

## 2011-01-04 ENCOUNTER — Encounter (HOSPITAL_COMMUNITY): Payer: Self-pay | Admitting: Anesthesiology

## 2011-01-04 ENCOUNTER — Encounter (HOSPITAL_COMMUNITY)
Admission: RE | Disposition: A | Discharge status: Home / Self Care | Payer: Self-pay | Source: Ambulatory Visit | Attending: Obstetrics & Gynecology

## 2011-01-04 ENCOUNTER — Other Ambulatory Visit: Payer: Self-pay | Admitting: Obstetrics & Gynecology

## 2011-01-04 ENCOUNTER — Ambulatory Visit (HOSPITAL_COMMUNITY)
Admission: RE | Admit: 2011-01-04 | Discharge: 2011-01-04 | Disposition: A | Discharge status: Home / Self Care | Payer: PRIVATE HEALTH INSURANCE | Source: Ambulatory Visit | Attending: Obstetrics & Gynecology | Admitting: Obstetrics & Gynecology

## 2011-01-04 ENCOUNTER — Ambulatory Visit (HOSPITAL_COMMUNITY): Payer: PRIVATE HEALTH INSURANCE | Admitting: Anesthesiology

## 2011-01-04 DIAGNOSIS — Z1501 Genetic susceptibility to malignant neoplasm of breast: Secondary | ICD-10-CM | POA: Insufficient documentation

## 2011-01-04 DIAGNOSIS — C50919 Malignant neoplasm of unspecified site of unspecified female breast: Secondary | ICD-10-CM

## 2011-01-04 DIAGNOSIS — Z4002 Encounter for prophylactic removal of ovary: Secondary | ICD-10-CM | POA: Insufficient documentation

## 2011-01-04 DIAGNOSIS — Z803 Family history of malignant neoplasm of breast: Secondary | ICD-10-CM | POA: Insufficient documentation

## 2011-01-04 DIAGNOSIS — Z01818 Encounter for other preprocedural examination: Secondary | ICD-10-CM | POA: Insufficient documentation

## 2011-01-04 DIAGNOSIS — Z01812 Encounter for preprocedural laboratory examination: Secondary | ICD-10-CM | POA: Insufficient documentation

## 2011-01-04 HISTORY — PX: LAPAROSCOPY: SHX197

## 2011-01-04 HISTORY — PX: SALPINGOOPHORECTOMY: SHX82

## 2011-01-04 LAB — PREGNANCY, URINE: Preg Test, Ur: NEGATIVE

## 2011-01-04 SURGERY — LAPAROSCOPY OPERATIVE
Anesthesia: General | Site: Abdomen | Wound class: Clean

## 2011-01-04 MED ORDER — DEXAMETHASONE SODIUM PHOSPHATE 10 MG/ML IJ SOLN
INTRAMUSCULAR | Status: AC
  Filled 2011-01-04: qty 1

## 2011-01-04 MED ORDER — FENTANYL CITRATE 0.05 MG/ML IJ SOLN
INTRAMUSCULAR | Status: DC | PRN
  Administered 2011-01-04: 50 ug via INTRAVENOUS
  Administered 2011-01-04: 150 ug via INTRAVENOUS

## 2011-01-04 MED ORDER — PROPOFOL 10 MG/ML IV EMUL
INTRAVENOUS | Status: DC | PRN
  Administered 2011-01-04: 200 mg via INTRAVENOUS

## 2011-01-04 MED ORDER — PROPOFOL 10 MG/ML IV EMUL
INTRAVENOUS | Status: AC
  Filled 2011-01-04: qty 20

## 2011-01-04 MED ORDER — CEFAZOLIN SODIUM 1-5 GM-% IV SOLN
1.0000 g | INTRAVENOUS | Status: AC
  Administered 2011-01-04: 1 g via INTRAVENOUS

## 2011-01-04 MED ORDER — SUCCINYLCHOLINE CHLORIDE 20 MG/ML IJ SOLN
INTRAMUSCULAR | Status: AC
  Filled 2011-01-04: qty 1

## 2011-01-04 MED ORDER — PROMETHAZINE HCL 25 MG/ML IJ SOLN: 6.2500 mg | INTRAMUSCULAR | Status: DC | PRN

## 2011-01-04 MED ORDER — MIDAZOLAM HCL 2 MG/2ML IJ SOLN
INTRAMUSCULAR | Status: AC
  Filled 2011-01-04: qty 2

## 2011-01-04 MED ORDER — MIDAZOLAM HCL 5 MG/5ML IJ SOLN
INTRAMUSCULAR | Status: DC | PRN
  Administered 2011-01-04: 2 mg via INTRAVENOUS

## 2011-01-04 MED ORDER — KETOROLAC TROMETHAMINE 30 MG/ML IJ SOLN: 15.0000 mg | Freq: Once | INTRAMUSCULAR | Status: DC | PRN

## 2011-01-04 MED ORDER — FENTANYL CITRATE 0.05 MG/ML IJ SOLN: 25.0000 ug | INTRAMUSCULAR | Status: DC | PRN

## 2011-01-04 MED ORDER — NEOSTIGMINE METHYLSULFATE 1 MG/ML IJ SOLN
INTRAMUSCULAR | Status: DC | PRN
  Administered 2011-01-04: 4 mg via INTRAMUSCULAR

## 2011-01-04 MED ORDER — KETOROLAC TROMETHAMINE 30 MG/ML IJ SOLN
INTRAMUSCULAR | Status: AC
  Filled 2011-01-04: qty 1

## 2011-01-04 MED ORDER — LACTATED RINGERS IV SOLN
INTRAVENOUS | Status: DC
Start: 2011-01-04 — End: 2011-01-04
  Administered 2011-01-04 (×2): via INTRAVENOUS

## 2011-01-04 MED ORDER — GLYCOPYRROLATE 0.2 MG/ML IJ SOLN
INTRAMUSCULAR | Status: AC
  Filled 2011-01-04: qty 1

## 2011-01-04 MED ORDER — OXYCODONE-ACETAMINOPHEN 5-325 MG PO TABS: 1.0000 | ORAL_TABLET | ORAL | Status: AC | PRN

## 2011-01-04 MED ORDER — KETOROLAC TROMETHAMINE 30 MG/ML IJ SOLN
INTRAMUSCULAR | Status: DC | PRN
  Administered 2011-01-04: 30 mg via INTRAVENOUS

## 2011-01-04 MED ORDER — ACETAMINOPHEN 325 MG PO TABS: 325.0000 mg | ORAL_TABLET | ORAL | Status: DC | PRN

## 2011-01-04 MED ORDER — SUCCINYLCHOLINE CHLORIDE 20 MG/ML IJ SOLN
INTRAMUSCULAR | Status: DC | PRN
  Administered 2011-01-04: 100 mg via INTRAVENOUS

## 2011-01-04 MED ORDER — NEOSTIGMINE METHYLSULFATE 1 MG/ML IJ SOLN
INTRAMUSCULAR | Status: AC
  Filled 2011-01-04: qty 10

## 2011-01-04 MED ORDER — GLYCOPYRROLATE 0.2 MG/ML IJ SOLN
INTRAMUSCULAR | Status: DC | PRN
  Administered 2011-01-04: 0.1 mg via INTRAVENOUS
  Administered 2011-01-04: .8 mg via INTRAVENOUS

## 2011-01-04 MED ORDER — PROMETHAZINE HCL 25 MG/ML IJ SOLN
INTRAMUSCULAR | Status: AC
  Administered 2011-01-04: 12.5 mg via INTRAVENOUS
  Filled 2011-01-04: qty 1

## 2011-01-04 MED ORDER — ONDANSETRON HCL 4 MG/2ML IJ SOLN
INTRAMUSCULAR | Status: DC | PRN
  Administered 2011-01-04: 4 mg via INTRAVENOUS

## 2011-01-04 MED ORDER — ROCURONIUM BROMIDE 100 MG/10ML IV SOLN
INTRAVENOUS | Status: DC | PRN
  Administered 2011-01-04 (×2): 10 mg via INTRAVENOUS

## 2011-01-04 MED ORDER — ONDANSETRON HCL 4 MG/2ML IJ SOLN
INTRAMUSCULAR | Status: AC
  Filled 2011-01-04: qty 2

## 2011-01-04 MED ORDER — BUPIVACAINE HCL (PF) 0.5 % IJ SOLN
INTRAMUSCULAR | Status: DC | PRN
  Administered 2011-01-04: 12 mL

## 2011-01-04 MED ORDER — LIDOCAINE HCL (CARDIAC) 20 MG/ML IV SOLN
INTRAVENOUS | Status: AC
  Filled 2011-01-04: qty 5

## 2011-01-04 MED ORDER — PROMETHAZINE HCL 25 MG/ML IJ SOLN
12.5000 mg | Freq: Four times a day (QID) | INTRAMUSCULAR | Status: DC | PRN
  Administered 2011-01-04: 12.5 mg via INTRAVENOUS
  Filled 2011-01-04: qty 1

## 2011-01-04 MED ORDER — DEXAMETHASONE SODIUM PHOSPHATE 10 MG/ML IJ SOLN
INTRAMUSCULAR | Status: DC | PRN
  Administered 2011-01-04: 10 mg via INTRAVENOUS

## 2011-01-04 MED ORDER — FENTANYL CITRATE 0.05 MG/ML IJ SOLN
INTRAMUSCULAR | Status: AC
  Filled 2011-01-04: qty 10

## 2011-01-04 MED ORDER — ROCURONIUM BROMIDE 50 MG/5ML IV SOLN
INTRAVENOUS | Status: AC
  Filled 2011-01-04: qty 1

## 2011-01-04 MED ORDER — EPHEDRINE SULFATE 50 MG/ML IJ SOLN
INTRAMUSCULAR | Status: DC | PRN
  Administered 2011-01-04: 5 mg via INTRAVENOUS
  Administered 2011-01-04: 10 mg via INTRAVENOUS

## 2011-01-04 MED ORDER — CEFAZOLIN SODIUM 1-5 GM-% IV SOLN
INTRAVENOUS | Status: AC
  Filled 2011-01-04: qty 50

## 2011-01-04 SURGICAL SUPPLY — 26 items
BAG SPEC RTRVL LRG 6X4 10 (ENDOMECHANICALS) ×2
CATH ROBINSON RED A/P 16FR (CATHETERS) ×3 IMPLANT
CHLORAPREP W/TINT 26ML (MISCELLANEOUS) ×3 IMPLANT
CLOTH BEACON ORANGE TIMEOUT ST (SAFETY) ×3 IMPLANT
DRAPE LAPAROSCOPIC ABDOMINAL (DRAPES) ×1 IMPLANT
DRSG COVADERM PLUS 2X2 (GAUZE/BANDAGES/DRESSINGS) ×1 IMPLANT
ELECT REM PT RETURN 9FT ADLT (ELECTROSURGICAL)
ELECTRODE REM PT RTRN 9FT ADLT (ELECTROSURGICAL) IMPLANT
GLOVE BIO SURGEON STRL SZ 6.5 (GLOVE) ×6 IMPLANT
GOWN PREVENTION PLUS LG XLONG (DISPOSABLE) ×7 IMPLANT
NDL INSUFFLATION 14GA 120MM (NEEDLE) ×2 IMPLANT
NDL SAFETY ECLIPSE 18X1.5 (NEEDLE) ×2 IMPLANT
NEEDLE HYPO 18GX1.5 SHARP (NEEDLE) ×3
NEEDLE INSUFFLATION 14GA 120MM (NEEDLE) ×3 IMPLANT
NS IRRIG 1000ML POUR BTL (IV SOLUTION) ×3 IMPLANT
PACK LAPAROSCOPY BASIN (CUSTOM PROCEDURE TRAY) ×3 IMPLANT
POUCH SPECIMEN RETRIEVAL 10MM (ENDOMECHANICALS) ×1 IMPLANT
SEALER TISSUE G2 CVD JAW 35 (ENDOMECHANICALS) IMPLANT
SEALER TISSUE G2 CVD JAW 45CM (ENDOMECHANICALS) ×1
STRIP CLOSURE SKIN 1/4X4 (GAUZE/BANDAGES/DRESSINGS) ×3 IMPLANT
SUT VICRYL 0 UR6 27IN ABS (SUTURE) ×5 IMPLANT
SUT VICRYL 4-0 PS2 18IN ABS (SUTURE) ×5 IMPLANT
TOWEL OR 17X24 6PK STRL BLUE (TOWEL DISPOSABLE) ×6 IMPLANT
TROCAR XCEL NON-BLD 11X100MML (ENDOMECHANICALS) ×1 IMPLANT
TROCAR XCEL NON-BLD 5MMX100MML (ENDOMECHANICALS) ×2 IMPLANT
WATER STERILE IRR 1000ML POUR (IV SOLUTION) ×3 IMPLANT

## 2011-01-04 NOTE — Discharge Instructions (Signed)
Bilateral Salpingo-Oophorectomy (Removal of Tubes and Ovaries) Removal of both fallopian tubes and ovaries is called a Bilateral Salpingo-oophorectomy (BSO). The fallopian tubes transport the egg from the ovary to the womb (uterus). The fallopian tube is also where the sperm and egg meet and become fertilized and move down into the uterus. Usually, the uterus was previously removed when doing a BSO. Removing both tubes and ovaries will:  Put you into the menopause. You will no longer have menstrual periods.   May cause you to have symptoms of menopause (hot flashes, night sweats, mood changes).   Not affect your sex drive or physical relationship.   Cause you to not be able to become pregnant (sterile).  BEFORE THE PROCEDURE:  Do not take aspirin or blood thinners because it can make you bleed.   Do not eat or drink anything at least 8 hours before the surgery.   Let your caregiver know if you develop a cold or an infection.   If you are being admitted the day of surgery, arrive at least one hour before the surgery to read and sign the necessary forms and consents.   Arrange for help when you go home from the hospital.   If you smoke, do not smoke for at least 2 weeks before the surgery.   There is a waiting room available for friends and family while you are having surgery.  LET YOUR CAREGIVERS KNOW ABOUT:  Allergies to food or medication.  Medications taken including herbs, eye drops, over-the-counter medications and creams.   Use of steroids (by mouth or creams).   Previous problems with anesthetics or numbing medication.   Possibility of pregnancy, if this applies.  Your smoking habits   History of blood clots (thrombophlebitis).   History of bleeding or blood problems.   Previous surgeries.   Other health problems.   RISKS & COMPLICATIONS OF THE PROCEDURE All surgery is associated with risks. Some of these risks are:  Injury to surrounding organs.   Bleeding.     Infection.   Blood clots in the legs or lungs.   Problems with the anesthesia.   The surgery does not help the problem.   Death.  PROCEDURE You will change into a hospital gown. Then, you will be given an IV (intravenous) and a medication to relax you. You will be put to sleep with an anesthetic. Any hair on your lower belly (abdomen) will be removed, and a catheter will be placed in your bladder. The fallopian tubes and ovaries will be removed either through 2 very small cuts (incisions) or through large incision in the lower abdomen. AFTER THE PROCEDURE:  You will be taken to the recovery room for 1 to 3 hours until your blood pressure, pulse and temperature are stable and you are waking up.   If you had a laparoscopy, you may be discharged in several hours.   If you had a laparoscopy, you may have shoulder pain for a day or two from air left in the abdomen. The air can irritate the nerve that goes from the diaphragm to the shoulder.   You will be given pain medication as is necessary.   The intravenous and catheter will be removed.   Have someone available to take you home from the hospital.  HOME CARE INSTRUCTIONS  Only take over-the-counter or prescription medicines for pain, discomfort or fever as directed by your caregiver.   Do not take aspirin. It can cause bleeding.   Do not drive   when taking pain medication.   Follow your caregiver's advice regarding diet, exercise, lifting, driving and general activities.   You may resume your usual diet as directed and allowed.   Get plenty of rest and sleep.   Do not douche, use tampons, or have sexual intercourse until your caregiver says it is OK.   Change your bandages (dressings) as directed.   Take your temperature twice a day and write it down.   Your caregiver may recommend showers instead of baths for a few weeks.   Do not drink alcohol until your caregiver says it is OK.   If you develop constipation, you may  take a mild laxative with your caregiver's permission. Bran foods and drinking fluids helps with constipation problems.   Try to have someone home with you for a week or two to help with the household activities.   Make sure you and your family understands everything about your operation and recovery.   Do not sign any legal documents until you feel normal again.   Keep all your follow-up appointments.  SEEK MEDICAL CARE IF:  There is swelling, redness or increasing pain in the wound area.   Pus is coming from the wound.   You notice a bad smell from the wound or surgical dressing.   You have pain, redness and swelling from the intravenous site.   The wound is breaking open (the edges are not staying together).   You feel dizzy or feel like fainting.   You develop pain or bleeding when you urinate.   You develop diarrhea.   You develop nausea and vomiting.   You develop abnormal vaginal discharge.   You develop a rash.   You have any type of abnormal reaction or develop an allergy to your medication.   You need stronger pain medication for your pain.  SEEK IMMEDIATE MEDICAL CARE IF:  You develop an unexplained temperature above 100 F (37.8 C).   You develop abdominal pain.   You develop chest pain.   You develop shortness of breath.   You pass out.   You develop pain, swelling or redness of your leg.   You develop heavy vaginal bleeding with or without blood clots.  Document Released: 04/02/2005 Document Re-Released: 06/27/2009 [employer] Patient Information 2011 [employer]. 

## 2011-01-04 NOTE — Progress Notes (Signed)
Pt c/o nausea. Dr. Brayton Caves notified. pherengan 12.mg iv given

## 2011-01-04 NOTE — Brief Op Note (Signed)
01/04/2011  1:39 PM  PATIENT:  Erin [name]  50 y.o. female  PRE-OPERATIVE DIAGNOSIS:  BRCA2 Positive [174.9]  POST-OPERATIVE DIAGNOSIS:  BRCA2 Positive [174.9]  PROCEDURE:  Procedure(s): LAPAROSCOPY OPERATIVE SALPINGO OOPHERECTOMY  SURGEON:  Nicholaus Bloom, MD  PHYSICIAN ASSISTANT:   ASSISTANTS: Cori Razor, MD  ANESTHESIA:   general  OR FLUID I/O:  Total I/O In: 1000 [I.V.:1000] Out: 105 [Urine:100; Blood:5]  BLOOD ADMINISTERED:none  DRAINS: none   LOCAL MEDICATIONS USED:  MARCAINE 30 CC  SPECIMEN:  Source of Specimen:  ovaries and tubes  DISPOSITION OF SPECIMEN:  PATHOLOGY  COUNTS:  YES  TOURNIQUET:  * No tourniquets in log *  DICTATION: .Dragon Dictation  PLAN OF CARE: Discharge to home after PACU  PATIENT DISPOSITION:  PACU - hemodynamically stable.   Delay start of Pharmacological VTE agent (>24hrs) due to surgical blood loss or risk of bleeding:  no              01/04/2011  1:39 PM  PATIENT:  Erin [name]  50 y.o. female  PRE-OPERATIVE DIAGNOSIS:  Breast cancer, BRCA2 Positive [174.9]  POST-OPERATIVE DIAGNOSIS:  Breast cancer, BRCA2 Positive [174.9]  PROCEDURE:  Procedure(s): LAPAROSCOPY OPERATIVE SALPINGO OOPHERECTOMY  SURGEON:  Surgeon(s): Carmellia Kreisler C. Marice Potter, MD Tereso Newcomer, MD  PHYSICIAN ASSISTANT:   ASSISTANTS: none   ANESTHESIA:   general  OR FLUID I/O:  Total I/O In: 1000 [I.V.:1000] Out: 105 [Urine:100; Blood:5]  BLOOD ADMINISTERED:none  DRAINS: none   LOCAL MEDICATIONS USED:  MARCAINE 30CC  SPECIMEN:  Source of Specimen:  ovaries and tubes  DISPOSITION OF SPECIMEN:  PATHOLOGY  COUNTS:  YES  TOURNIQUET:  * No tourniquets in log *  DICTATION: .Dragon Dictation  PLAN OF CARE: Discharge to home after PACU  PATIENT DISPOSITION:  PACU - hemodynamically stable.   Delay start of Pharmacological VTE agent (>24hrs) due to surgical blood loss or risk of bleeding:  no      The risks, benefits, and  alternatives of surgery were explained, understood, and accepted. Consents were signed. In the operating room, general anesthesia was applied without complication. Her abdomen and vagina were prepped and draped in the usual sterile fashion. A complete catheter was used to drain her bladder for approximately 25 mL. 10 mL of 0.5% Marcaine was injected into the umbilical area a 10 mm incision was made and a varies needle was placed intraperitoneally. Low-flow CO2 was used to insufflate the abdomen to approximately 7 L;  patient abdominal pressure was always less than 10. A 10 mm trocar was placed. Laparoscopy confirmed correct placement. She was placed in Trendelenburg position and her pelvis was inspected. A 5 mm port was placed in each lower quadrant under direct laparoscopic visualization, taking care to avoid the inferior epigastric vessels. Please note that prior to making the incisions in the lower abdomen, 5 mL of 0.5% Marcaine were injected into each site. The ovaries were inspected. They appeared normal. There was a small adhesion of the omentum to the left adnexa. The right adnexa was placed on traction the. The infundibulopelvic ligament was identified. The Enseal was used to cauterize and ligate the infundibulopelvic ligament. Excellent hemostasis was noted. The ovary was placed in the cul-de-sac for removal later. The left ovary was then placed on traction. The small adhesion was ligated with the Enseal. The IP ligament was identified cauterized and ligated with the Enseal as well. We then changed from a 10 mm scope in the umbilicus to a 5 mm scope in the  right lower quadrant port. We used a Endopouch to retrieve the specimens (ovaries). The CO2 was allowed to escape from the abdomen after assuring hemostasis of the pedicles. The 5 mm ports were removed as was the 10 mm port. The umbilical fascia was closed with a figure-of-eight suture of 0 Vicryl suture subcuticular closure was done at all 3 sites using  3-0 Vicryl suture. She was extubated and taken to the recovery room in stable condition after the instrument, sponge, and needle counts were correct.

## 2011-01-04 NOTE — H&P (Signed)
Erin [name] is an 50 y.o. female. She recently had a positive BRCA1/2. Her daughter was diagnosed with breast cancer at 30yo.  Her mother was diagnosed in her 48s and her maternal aunt died in her 60s of breast cancer  Pertinent Gynecological History: Menses: no period since about 1985 Bleeding: Contraception:  DES exposure:  Blood transfusions:  Sexually transmitted diseases:  Previous GYN Procedures:  Last mammogram: normal Date: July 2012 Last pap: normal Date:  OB History: G4, P2 A2   Menstrual History: Menarche age:40 No LMP recorded. Patient is postmenopausal.    Past Medical History  Diagnosis Date  . Chronic constipation   . Bursitis   . ASCUS (atypical squamous cells of undetermined significance) on Pap smear   . HPV (human papilloma virus) infection   . Hot flashes, menopausal   . Condyloma   . Seizures     unknown reason for seizures- last seizurein Sept 2012  . Blood transfusion 1983    In [place]  . Headache     migraines  . Arthritis     hands and feet  . Anxiety   . Depression     per pt h/o depression- no meds currently. Dx of bi-polar depression in [facility]    Past Surgical History  Procedure Date  . Total vaginal hysterectomy 50 yrs old  . Abdominoplasty     Family History  Problem Relation Age of Onset  . Breast cancer Mother     postmenopausal  . Ovarian cancer Neg Hx   . Uterine cancer Neg Hx     Social History:  reports that she has been smoking Cigarettes.  She has a 20 pack-year smoking history. She has never used smokeless tobacco. She reports that she drinks alcohol. She reports that she uses illicit drugs (Marijuana and Cocaine). (none since about 2005)  Allergies: No Known Allergies  Prescriptions prior to admission  Medication Sig Dispense Refill  . Garlic 1000 MG CAPS Take 2 capsules by mouth daily.        . Glucosamine Sulfate 1000 MG CAPS Take 2 capsules by mouth daily.        . Omega-3 Fatty Acids (FISH OIL) 1000 MG CAPS  Take 3 capsules by mouth daily.        Marland Kitchen topiramate (TOPAMAX) 50 MG tablet Take 50 mg by mouth 2 (two) times daily.        . vitamin E (VITAMIN E) 400 UNIT capsule Take 800 Units by mouth 2 (two) times daily.          ROS Abstinent since July 2012 Unemployed  Blood pressure 127/80, pulse 66, temperature 98.1 F (36.7 C), temperature source Oral, resp. rate 18, SpO2 100.00%. Physical Exam HEENT: normal Heart: RRR without m,r,g Lungs: CTAB Abd: benign Ext: no c/c/e  Results for orders placed during the hospital encounter of 01/04/11 (from the past 24 hour(s))  PREGNANCY, URINE     Status: Normal   Collection Time   01/04/11 11:43 AM      Component Value Range   Preg Test, Ur NEGATIVE      No results found.  Assessment/Plan: Positive BRCA1/2- I have recommended that she have her ovaries and tubes removed  Birtha Hatler C. 01/04/2011, 12:23 PM

## 2011-01-04 NOTE — Anesthesia Procedure Notes (Signed)
Date/Time: 01/04/2011 12:58 PM Performed by: Karleen Dolphin Pre-anesthesia Checklist: Patient identified, Patient being monitored, Emergency Drugs available, Timeout performed and Suction available Patient Re-evaluated:Patient Re-evaluated prior to inductionOxygen Delivery Method: Circle System Utilized Preoxygenation: Pre-oxygenation with 100% oxygen Intubation Type: IV induction Ventilation: Mask ventilation without difficulty Laryngoscope Size: Mac and 3 Grade View: Grade I Tube type: Oral Tube size: 7.0 mm Airway Equipment and Method: stylet Secured at: 22 cm Tube secured with: Tape Dental Injury: Teeth and Oropharynx as per pre-operative assessment

## 2011-01-04 NOTE — Transfer of Care (Signed)
Immediate Anesthesia Transfer of Care Note  Patient: Erin [name]  Procedure(s) Performed:  LAPAROSCOPY OPERATIVE; SALPINGO OOPHERECTOMY  Patient Location: PACU  Anesthesia Type: General  Level of Consciousness: awake, alert  and oriented  Airway & Oxygen Therapy: Patient Spontanous Breathing and Patient connected to nasal cannula oxygen  Post-op Assessment: Report given to PACU RN and Post -op Vital signs reviewed and stable  Post vital signs: Reviewed and stable  Complications: No apparent anesthesia complications

## 2011-01-04 NOTE — Anesthesia Preprocedure Evaluation (Signed)
Anesthesia Evaluation  Name, MR# and DOB Patient awake  General Assessment Comment  Reviewed: Allergy & Precautions, H&P , Patient's Chart, lab work & pertinent test results, reviewed documented beta blocker date and time   History of Anesthesia Complications Negative for: history of anesthetic complications  Airway Mallampati: II TM Distance: >3 FB Neck ROM: full    Dental No notable dental hx.    Pulmonary  clear to auscultation  pulmonary exam normalPulmonary Exam Normal breath sounds clear to auscultation none    Cardiovascular Exercise Tolerance: Good regular Normal    Neuro/Psych   Headaches Seizures -,  (+) PSYCHIATRIC DISORDERS,  Negative Neurological ROS  Negative Psych ROSSevere anxiety  GI/Hepatic/Renal negative GI ROS  negative Liver ROS  negative Renal ROS        Endo/Other  Negative Endocrine ROS (+)      Abdominal   Musculoskeletal   Hematology negative hematology ROS (+)   Peds  Reproductive/Obstetrics negative OB ROS    Anesthesia Other Findings             Anesthesia Physical Anesthesia Plan  ASA: III  Anesthesia Plan: General   Post-op Pain Management:    Induction:   Airway Management Planned:   Additional Equipment:   Intra-op Plan:   Post-operative Plan:   Informed Consent: I have reviewed the patients History and Physical, chart, labs and discussed the procedure including the risks, benefits and alternatives for the proposed anesthesia with the patient or authorized representative who has indicated his/her understanding and acceptance.   Dental Advisory Given  Plan Discussed with: CRNA and Surgeon  Anesthesia Plan Comments: (Current nausea from anxiety... Potential RSI depending on RX)        Anesthesia Quick Evaluation

## 2011-01-05 NOTE — Anesthesia Postprocedure Evaluation (Signed)
Anesthesia Post Note  Patient: Erin [name]  Procedure(s) Performed:  LAPAROSCOPY OPERATIVE; SALPINGO OOPHERECTOMY  Anesthesia type: GA  Patient location: PACU  Post pain: Pain level controlled  Post assessment: Post-op Vital signs reviewed  Last Vitals:  Filed Vitals:   01/04/11 1430  BP: 112/69  Pulse: 61  Temp: 97.8 F (36.6 C)  Resp: 20    Post vital signs: Reviewed  Level of consciousness: sedated  Complications: No apparent anesthesia complications

## 2011-01-11 ENCOUNTER — Encounter (HOSPITAL_COMMUNITY): Payer: Self-pay | Admitting: Obstetrics & Gynecology

## 2011-01-22 SURGERY — Surgical Case
Anesthesia: *Unknown

## 2011-02-20 ENCOUNTER — Ambulatory Visit (INDEPENDENT_AMBULATORY_CARE_PROVIDER_SITE_OTHER): Payer: PRIVATE HEALTH INSURANCE | Admitting: Obstetrics & Gynecology

## 2011-02-20 ENCOUNTER — Encounter: Payer: Self-pay | Admitting: Obstetrics & Gynecology

## 2011-02-20 VITALS — BP 114/77 | HR 73 | Temp 98.6°F | Resp 17 | Ht 67.0 in | Wt 116.0 lb

## 2011-02-20 DIAGNOSIS — Z9889 Other specified postprocedural states: Secondary | ICD-10-CM

## 2011-02-20 DIAGNOSIS — A630 Anogenital (venereal) warts: Secondary | ICD-10-CM

## 2011-02-20 NOTE — Progress Notes (Signed)
  Subjective:    Patient ID: Erin [name], female    DOB: 01-Dec-1960, 50 y.o.   MRN: [id number]  HPI  Ms. Mcglade comes in with the complaint of the return of some small right vulvar and perianal condyloma.  She would like them treated with TCA as she has done in the past.  She has no post op complaints and is very happy with the cosmetic results of the scars.  Review of Systems    pap due in February Objective:   Physical Exam  All three incisions well healed. 7 small condyloma in positions noted above were treated with TCA. She tolerated the procedure well.      Assessment & Plan:  Condyloma- re treated.  She can come back prn if they return Post op- doing well. She will come back in February for her annual exam.

## 2011-06-20 ENCOUNTER — Ambulatory Visit (INDEPENDENT_AMBULATORY_CARE_PROVIDER_SITE_OTHER): Payer: PRIVATE HEALTH INSURANCE | Admitting: Obstetrics & Gynecology

## 2011-06-20 VITALS — BP 114/79 | HR 69 | Temp 97.0°F | Ht 67.0 in | Wt 111.1 lb

## 2011-06-20 DIAGNOSIS — N898 Other specified noninflammatory disorders of vagina: Secondary | ICD-10-CM

## 2011-06-20 DIAGNOSIS — A630 Anogenital (venereal) warts: Secondary | ICD-10-CM

## 2011-06-20 NOTE — Progress Notes (Signed)
  Subjective:    Patient ID: Erin [name], female    DOB: 11-17-1960, 51 y.o.   MRN: [id number]  HPI   Erin [name] comes in with the complaint of a smelly vaginal discharge. She also thinks she has found some new genital warts between her vagina and anus. Review of Systems     Objective:   Physical Exam  3 small condyloma as described above- I treated them with TCA. Her vagina is very atrophic and has a normal discharge, but I sent a wet prep.      Assessment & Plan:  As above She will come back in a month if the warts are not gone by then.

## 2011-06-21 LAB — WET PREP, GENITAL: Trich, Wet Prep: NONE SEEN

## 2011-08-15 ENCOUNTER — Encounter (HOSPITAL_COMMUNITY): Payer: Self-pay | Admitting: Psychiatry

## 2011-08-15 ENCOUNTER — Ambulatory Visit (INDEPENDENT_AMBULATORY_CARE_PROVIDER_SITE_OTHER): Payer: PRIVATE HEALTH INSURANCE | Admitting: Psychiatry

## 2011-08-15 DIAGNOSIS — F0630 Mood disorder due to known physiological condition, unspecified: Secondary | ICD-10-CM
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
## 2011-08-23 ENCOUNTER — Ambulatory Visit (HOSPITAL_COMMUNITY): Payer: PRIVATE HEALTH INSURANCE | Admitting: Psychiatry

## 2011-09-18 ENCOUNTER — Ambulatory Visit (HOSPITAL_COMMUNITY): Payer: Self-pay | Admitting: Licensed Clinical Social Worker

## 2011-10-02 ENCOUNTER — Ambulatory Visit (HOSPITAL_COMMUNITY): Payer: Self-pay | Admitting: Licensed Clinical Social Worker

## 2011-10-19 ENCOUNTER — Ambulatory Visit (HOSPITAL_COMMUNITY): Payer: Self-pay | Admitting: Licensed Clinical Social Worker

## 2011-10-23 ENCOUNTER — Encounter (HOSPITAL_COMMUNITY): Payer: Self-pay | Admitting: Licensed Clinical Social Worker

## 2011-10-23 ENCOUNTER — Ambulatory Visit (INDEPENDENT_AMBULATORY_CARE_PROVIDER_SITE_OTHER): Payer: Medicare Other | Admitting: Licensed Clinical Social Worker

## 2011-10-23 DIAGNOSIS — F32A Depression, unspecified: Secondary | ICD-10-CM

## 2011-10-23 DIAGNOSIS — F329 Major depressive disorder, single episode, unspecified: Secondary | ICD-10-CM

## 2011-10-23 DIAGNOSIS — F3130 Bipolar disorder, current episode depressed, mild or moderate severity, unspecified: Secondary | ICD-10-CM
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
## 2011-10-24 ENCOUNTER — Encounter (HOSPITAL_COMMUNITY): Payer: Self-pay | Admitting: Licensed Clinical Social Worker

## 2011-10-30 ENCOUNTER — Ambulatory Visit (INDEPENDENT_AMBULATORY_CARE_PROVIDER_SITE_OTHER): Payer: Medicare Other | Admitting: Licensed Clinical Social Worker

## 2011-10-30 DIAGNOSIS — F319 Bipolar disorder, unspecified: Secondary | ICD-10-CM
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
## 2011-11-07 ENCOUNTER — Ambulatory Visit (HOSPITAL_COMMUNITY): Payer: Self-pay | Admitting: Licensed Clinical Social Worker

## 2011-11-08 ENCOUNTER — Ambulatory Visit (HOSPITAL_COMMUNITY): Payer: Self-pay | Admitting: Licensed Clinical Social Worker

## 2011-11-13 ENCOUNTER — Ambulatory Visit (INDEPENDENT_AMBULATORY_CARE_PROVIDER_SITE_OTHER): Payer: Medicare Other | Admitting: Licensed Clinical Social Worker

## 2011-11-13 DIAGNOSIS — F319 Bipolar disorder, unspecified: Secondary | ICD-10-CM
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
## 2011-11-20 ENCOUNTER — Ambulatory Visit (INDEPENDENT_AMBULATORY_CARE_PROVIDER_SITE_OTHER): Payer: Medicare Other | Admitting: Licensed Clinical Social Worker

## 2011-11-20 DIAGNOSIS — F319 Bipolar disorder, unspecified: Secondary | ICD-10-CM
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
## 2011-11-21 ENCOUNTER — Encounter: Payer: Self-pay | Admitting: Obstetrics & Gynecology

## 2011-11-21 ENCOUNTER — Ambulatory Visit (INDEPENDENT_AMBULATORY_CARE_PROVIDER_SITE_OTHER): Payer: PRIVATE HEALTH INSURANCE | Admitting: Obstetrics & Gynecology

## 2011-11-21 VITALS — BP 102/61 | HR 70 | Temp 98.0°F | Resp 16 | Ht 67.0 in | Wt 119.0 lb

## 2011-11-21 DIAGNOSIS — Z0000 Encounter for general adult medical examination without abnormal findings: Secondary | ICD-10-CM

## 2011-11-21 DIAGNOSIS — Z1231 Encounter for screening mammogram for malignant neoplasm of breast: Secondary | ICD-10-CM

## 2011-11-21 MED ORDER — IMIQUIMOD 5 % EX CREA: TOPICAL_CREAM | CUTANEOUS | Status: DC

## 2011-11-21 NOTE — Progress Notes (Signed)
Subjective:    Erin [name] is a 51 y.o. female who presents for an annual exam. The patient has no complaints today. She would like a prescription for Aldara for a few small vulvar warts. The patient is not currently sexually active. GYN screening history: last pap: was normal. The patient wears seatbelts: yes. The patient participates in regular exercise: no. Has the patient ever been transfused or tattooed?: yes. The patient reports that there is not domestic violence in her life.   Menstrual History: OB History    Grav Para Term Preterm Abortions TAB SAB Ect Mult Living   4 2 2  2     2       Menarche age: 60  No LMP recorded. Patient has had a hysterectomy.    The following portions of the patient's history were reviewed and updated as appropriate: allergies, current medications, past family history, past medical history, past social history, past surgical history and problem list.  Review of Systems A comprehensive review of systems was negative.    Objective:    BP 102/61  Pulse 70  Temp 98 F (36.7 C) (Oral)  Resp 16  Ht 5\' 7"  (1.702 m)  Wt 119 lb (53.978 kg)  BMI 18.64 kg/m2  General Appearance:    Alert, cooperative, no distress, appears stated age  Head:    Normocephalic, without obvious abnormality, atraumatic  Eyes:    PERRL, conjunctiva/corneas clear, EOM's intact, fundi    benign, both eyes  Ears:    Normal TM's and external ear canals, both ears  Nose:   Nares normal, septum midline, mucosa normal, no drainage    or sinus tenderness  Throat:   Lips, mucosa, and tongue normal; teeth and gums normal  Neck:   Supple, symmetrical, trachea midline, no adenopathy;    thyroid:  no enlargement/tenderness/nodules; no carotid   bruit or JVD  Back:     Symmetric, no curvature, ROM normal, no CVA tenderness  Lungs:     Clear to auscultation bilaterally, respirations unlabored  Chest Wall:    No tenderness or deformity   Heart:    Regular rate and rhythm, S1 and S2  normal, no murmur, rub   or gallop  Breast Exam:    No tenderness, masses, or nipple abnormality  Abdomen:     Soft, non-tender, bowel sounds active all four quadrants,    no masses, no organomegaly  Genitalia:    Normal female without lesion, discharge or tenderness, moderate atrophy, several very small (5 mm or less) condyloma on lower labia majora, Normal bimanual exam     Extremities:   Extremities normal, atraumatic, no cyanosis or edema  Pulses:   2+ and symmetric all extremities  Skin:   Skin color, texture, turgor normal, no rashes or lesions  Lymph nodes:   Cervical, supraclavicular, and axillary nodes normal  Neurologic:   CNII-XII intact, normal strength, sensation and reflexes    throughout  .    Assessment:    Healthy female exam.    Plan:     Mammogram.  Aldara as requested

## 2011-11-21 NOTE — Addendum Note (Signed)
Addended by: Granville Lewis on: 11/21/2011 10:56 AM   Modules accepted: Orders

## 2011-11-27 ENCOUNTER — Ambulatory Visit (HOSPITAL_COMMUNITY): Payer: Self-pay | Admitting: Licensed Clinical Social Worker

## 2011-12-05 ENCOUNTER — Ambulatory Visit
Admission: RE | Admit: 2011-12-05 | Discharge: 2011-12-05 | Disposition: A | Discharge status: Home / Self Care | Payer: Medicare Other | Source: Ambulatory Visit | Attending: Obstetrics & Gynecology | Admitting: Obstetrics & Gynecology

## 2011-12-05 DIAGNOSIS — Z1231 Encounter for screening mammogram for malignant neoplasm of breast: Secondary | ICD-10-CM

## 2011-12-12 ENCOUNTER — Ambulatory Visit (INDEPENDENT_AMBULATORY_CARE_PROVIDER_SITE_OTHER): Payer: Medicaid Other | Admitting: Psychiatry

## 2011-12-12 ENCOUNTER — Encounter (HOSPITAL_COMMUNITY): Payer: Self-pay | Admitting: Psychiatry

## 2011-12-12 VITALS — BP 105/81 | HR 70 | Ht 67.0 in | Wt 121.0 lb

## 2011-12-12 DIAGNOSIS — F3130 Bipolar disorder, current episode depressed, mild or moderate severity, unspecified: Secondary | ICD-10-CM

## 2011-12-12 DIAGNOSIS — F319 Bipolar disorder, unspecified: Secondary | ICD-10-CM

## 2011-12-12 MED ORDER — LITHIUM CARBONATE 150 MG PO CAPS: ORAL_CAPSULE | ORAL | Status: DC
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
## 2011-12-18 ENCOUNTER — Ambulatory Visit (INDEPENDENT_AMBULATORY_CARE_PROVIDER_SITE_OTHER): Payer: Medicaid Other | Admitting: Licensed Clinical Social Worker

## 2011-12-18 DIAGNOSIS — F319 Bipolar disorder, unspecified: Secondary | ICD-10-CM

## 2011-12-19 ENCOUNTER — Encounter (HOSPITAL_COMMUNITY): Payer: Self-pay | Admitting: Licensed Clinical Social Worker

## 2011-12-19 NOTE — Progress Notes (Signed)
   THERAPIST PROGRESS NOTE  Session Time: 10:10 - 11:00  Participation Level: Active  Behavioral Response: CasualAlertAngry and Anxious  Type of Therapy: Individual Therapy  Treatment Goals addressed: Anger and Coping  Interventions: Motivational Interviewing, Solution Focused and Supportive  Summary: Erin [name] is a 51 y.o. female who presents with anxiety.  Erin [name] is not taking the Lithium because she could not remember whether she had taken it before - her daughter who is obsessed with medical problems remembers everything that she has taken and she told Takima that she did not do well on Lithium.  She will call Dr. Laury Deep and explain.  It is a real problem for her to know what med's she has taken and reactions because she has no memory of same.  Today Erin [name] talked a lot about her daughter's problems which impact the grandchildren.  Both of them do not keep a very clean house - Erin [name] is the worst with mold, dog feces and animal urine.  She is always sick with something. She has had anything that can be taken out removed. She has had cancer. She apparently uses this asa way to get attention and also not not have to do anything.  Anything that is done is done by her husband.  The children lack attention.  Her other daughter Erin [name] is too busy with working and drinking with her husband to attend to the children.  They are going on a trip to [place] that her husband won through work next week and Erin [name] is moving in to their house to take care of the children.  Erin [name] is the oldest and the people pleaser, Erin [name] is the child with ADHD and acts out and Erin [name] the youngest is the Best boy.  Erin [name] is the child of this union and the only one that the father likes.  She is worried about how Erin [name] might be for has serious meltdowns where he gets aggressive.  Discussed how she might structure the week to be able to manage and help the children. She is a more structured person and the children feel  loved by her.  Cautioned her to not expect herself to make all the changes for them in one week.  The most she could give is a different experience.  She will be cooking meals and helping them maintain a structure for school. .   Suicidal/Homicidal: Nowithout intent/plan  Plan: Return again in 1 weeks.  Diagnosis: Axis I: Bipolar, mixed    Axis II: Deferred    Erin [name],JUDITH A, LCSW 12/19/2011

## 2011-12-25 ENCOUNTER — Ambulatory Visit (HOSPITAL_COMMUNITY): Payer: Self-pay | Admitting: Licensed Clinical Social Worker

## 2012-01-01 ENCOUNTER — Ambulatory Visit (HOSPITAL_COMMUNITY): Payer: Self-pay | Admitting: Licensed Clinical Social Worker

## 2012-01-08 ENCOUNTER — Ambulatory Visit (HOSPITAL_COMMUNITY): Payer: Self-pay | Admitting: Licensed Clinical Social Worker

## 2012-01-09 ENCOUNTER — Ambulatory Visit (HOSPITAL_COMMUNITY): Payer: Self-pay | Admitting: Psychiatry

## 2012-01-09 ENCOUNTER — Telehealth (HOSPITAL_COMMUNITY): Payer: Self-pay | Admitting: Licensed Clinical Social Worker

## 2012-02-11 DIAGNOSIS — K52831 Collagenous colitis: Secondary | ICD-10-CM | POA: Insufficient documentation

## 2012-04-16 DIAGNOSIS — Z1509 Genetic susceptibility to other malignant neoplasm: Secondary | ICD-10-CM

## 2012-04-16 DIAGNOSIS — Z1501 Genetic susceptibility to malignant neoplasm of breast: Secondary | ICD-10-CM

## 2012-04-16 HISTORY — DX: Genetic susceptibility to malignant neoplasm of breast: Z15.01

## 2012-04-16 HISTORY — DX: Genetic susceptibility to malignant neoplasm of breast: Z15.09

## 2012-04-23 ENCOUNTER — Encounter (HOSPITAL_COMMUNITY): Payer: Self-pay | Admitting: Psychiatry

## 2012-04-24 ENCOUNTER — Ambulatory Visit (INDEPENDENT_AMBULATORY_CARE_PROVIDER_SITE_OTHER): Payer: Medicare Other | Admitting: Obstetrics & Gynecology

## 2012-04-24 ENCOUNTER — Encounter: Payer: Self-pay | Admitting: Obstetrics & Gynecology

## 2012-04-24 VITALS — BP 114/72 | HR 79 | Temp 98.4°F | Resp 17 | Wt 118.0 lb

## 2012-04-24 DIAGNOSIS — A630 Anogenital (venereal) warts: Secondary | ICD-10-CM

## 2012-04-24 NOTE — Progress Notes (Signed)
  Subjective:    Patient ID: Erin [name], female    DOB: [birth year], 52 y.o   MRN: [id number]  HPI  52 yo SW abstinent lady with a h/o condyloma (biopsy proven) who is here today for TCA treatment. She has tried Aldara at home but says, "That doesn't work."   Review of Systems Her mammogram was normal 8/13.    Objective:   Physical Exam  9 small condyloma on the right labium mostly, 2 on left labium I treated these with TCA with an appropriate white response of the treated areas. She tolerated it well.      Assessment & Plan:  Condyloma treated as above.  RTC 1 month if any are still present.

## 2012-08-06 ENCOUNTER — Ambulatory Visit: Payer: Self-pay | Admitting: Obstetrics & Gynecology

## 2012-08-07 ENCOUNTER — Ambulatory Visit (INDEPENDENT_AMBULATORY_CARE_PROVIDER_SITE_OTHER): Payer: Medicare Other | Admitting: Obstetrics & Gynecology

## 2012-08-07 ENCOUNTER — Encounter: Payer: Self-pay | Admitting: Obstetrics & Gynecology

## 2012-08-07 VITALS — BP 106/70 | HR 78 | Resp 16 | Ht 67.0 in | Wt 114.0 lb

## 2012-08-07 DIAGNOSIS — A630 Anogenital (venereal) warts: Secondary | ICD-10-CM

## 2012-08-07 NOTE — Progress Notes (Signed)
  Subjective:    Patient ID: Erin [name], female    DOB: [birth year], 52 y.o.   MRN: [id number]  HPI  52 yo W lady who is here today for TCA treatment of gluteal crack condyloma. The TCA worked on her labial condyloma, but she has discovered new ones at the top of her gluteal fold.  Review of Systems     Objective:   Physical Exam  3 small condyloma in the position described above These were treated with TCA Her vulva is shaved and appears to have no lesions.      Assessment & Plan:  Condyloma as above RTC 1 year

## 2012-08-13 NOTE — Telephone Encounter (Signed)
acknowledged

## 2012-08-15 DIAGNOSIS — R160 Hepatomegaly, not elsewhere classified: Secondary | ICD-10-CM | POA: Insufficient documentation

## 2012-09-14 HISTORY — PX: LAPAROSCOPY: SHX197

## 2012-10-01 DIAGNOSIS — C221 Intrahepatic bile duct carcinoma: Secondary | ICD-10-CM | POA: Insufficient documentation

## 2012-10-22 DIAGNOSIS — J449 Chronic obstructive pulmonary disease, unspecified: Secondary | ICD-10-CM | POA: Insufficient documentation

## 2012-10-22 DIAGNOSIS — J45909 Unspecified asthma, uncomplicated: Secondary | ICD-10-CM | POA: Insufficient documentation

## 2012-10-22 DIAGNOSIS — R569 Unspecified convulsions: Secondary | ICD-10-CM | POA: Insufficient documentation

## 2012-11-04 ENCOUNTER — Ambulatory Visit (INDEPENDENT_AMBULATORY_CARE_PROVIDER_SITE_OTHER): Payer: Medicare Other | Admitting: Obstetrics & Gynecology

## 2012-11-04 ENCOUNTER — Other Ambulatory Visit: Payer: Self-pay | Admitting: Obstetrics & Gynecology

## 2012-11-04 ENCOUNTER — Encounter: Payer: Self-pay | Admitting: Obstetrics & Gynecology

## 2012-11-04 VITALS — BP 105/75 | HR 75 | Resp 16 | Ht 65.0 in | Wt 104.0 lb

## 2012-11-04 DIAGNOSIS — A630 Anogenital (venereal) warts: Secondary | ICD-10-CM

## 2012-11-04 DIAGNOSIS — C229 Malignant neoplasm of liver, not specified as primary or secondary: Secondary | ICD-10-CM | POA: Insufficient documentation

## 2012-11-04 DIAGNOSIS — Z1501 Genetic susceptibility to malignant neoplasm of breast: Secondary | ICD-10-CM

## 2012-11-04 NOTE — Progress Notes (Signed)
Pt complaining of   1-Recurrent red bumps near anus.  Red, lasting approx 7 days  2-Warts  Exam: Red lesions crusted over (approx 3) 4 warts near anus  Assessment: 1-?HSV 2-Warts  Plan 1-Testing for HSV.  Pt to come as soon as lesions appear 2-TCA on warts 3-RTC in 1 month if still present.

## 2012-11-05 LAB — HSV 2 ANTIBODY, IGG: HSV 2 Glycoprotein G Ab, IgG: 8.83 IV — ABNORMAL HIGH

## 2012-11-06 LAB — HERPES SIMPLEX VIRUS CULTURE: Organism ID, Bacteria: NOT DETECTED

## 2012-11-18 ENCOUNTER — Encounter: Payer: Self-pay | Admitting: Obstetrics & Gynecology

## 2012-11-18 ENCOUNTER — Ambulatory Visit (INDEPENDENT_AMBULATORY_CARE_PROVIDER_SITE_OTHER): Payer: Medicare Other | Admitting: Obstetrics & Gynecology

## 2012-11-18 VITALS — BP 94/67 | HR 73 | Resp 16 | Ht 67.0 in | Wt 106.0 lb

## 2012-11-18 DIAGNOSIS — N899 Noninflammatory disorder of vagina, unspecified: Secondary | ICD-10-CM

## 2012-11-18 DIAGNOSIS — T300 Burn of unspecified body region, unspecified degree: Secondary | ICD-10-CM

## 2012-11-18 MED ORDER — SILVER SULFADIAZINE 1 % EX CREA: TOPICAL_CREAM | CUTANEOUS | Status: DC

## 2012-11-18 MED ORDER — VALACYCLOVIR HCL 1 G PO TABS: 1000.0000 mg | ORAL_TABLET | Freq: Every day | ORAL | Status: DC
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
## 2012-11-24 ENCOUNTER — Encounter: Payer: Self-pay | Admitting: Obstetrics & Gynecology

## 2012-11-24 DIAGNOSIS — R768 Other specified abnormal immunological findings in serum: Secondary | ICD-10-CM | POA: Insufficient documentation

## 2012-12-17 DIAGNOSIS — D2122 Benign neoplasm of connective and other soft tissue of left lower limb, including hip: Secondary | ICD-10-CM | POA: Insufficient documentation

## 2013-01-08 ENCOUNTER — Other Ambulatory Visit: Payer: Self-pay | Admitting: *Deleted

## 2013-01-08 DIAGNOSIS — B009 Herpesviral infection, unspecified: Secondary | ICD-10-CM

## 2013-01-08 MED ORDER — VALACYCLOVIR HCL 1 G PO TABS: 1000.0000 mg | ORAL_TABLET | Freq: Every day | ORAL | Status: DC

## 2013-01-08 NOTE — Telephone Encounter (Signed)
Pt called requesting a RF on Valtrex.  Ok'd per TO Dr Marice Potter.  RX sent to [facility]

## 2013-03-04 ENCOUNTER — Encounter: Payer: Self-pay | Admitting: Obstetrics & Gynecology

## 2013-03-04 ENCOUNTER — Ambulatory Visit (INDEPENDENT_AMBULATORY_CARE_PROVIDER_SITE_OTHER): Payer: Medicare Other | Admitting: Obstetrics & Gynecology

## 2013-03-04 VITALS — BP 111/75 | HR 79 | Resp 16 | Ht 67.0 in | Wt 112.0 lb

## 2013-03-04 DIAGNOSIS — A630 Anogenital (venereal) warts: Secondary | ICD-10-CM

## 2013-03-04 NOTE — Progress Notes (Signed)
  Subjective:    Patient ID: Erin [name], female    DOB: 11-15-1960, 52 y.o.   MRN: [id number]  HPI  Faun is here again with the complaint of growing condyloma of her vulva. I am still not convinced entirely that these are warts but she is very insistent that she wants TCA applied to these 6 areas.    Review of Systems     Objective:   Physical Exam  I appliied TCA to 6 very small raised skin areas, 3 on the right of her labia majora and 3 on the left. She tolerated the procedure well.       Assessment & Plan:  Condyloma RTC prn

## 2013-03-05 ENCOUNTER — Ambulatory Visit: Payer: Self-pay | Admitting: Obstetrics & Gynecology

## 2013-05-14 ENCOUNTER — Other Ambulatory Visit: Payer: Self-pay | Admitting: *Deleted

## 2013-05-14 DIAGNOSIS — B009 Herpesviral infection, unspecified: Secondary | ICD-10-CM

## 2013-05-14 MED ORDER — VALACYCLOVIR HCL 1 G PO TABS: 1000.0000 mg | ORAL_TABLET | Freq: Every day | ORAL | Status: DC

## 2013-05-14 NOTE — Telephone Encounter (Signed)
RF request from [facility] for Valtrex given and sent to [facility].

## 2013-07-01 ENCOUNTER — Ambulatory Visit: Payer: Self-pay | Admitting: Obstetrics & Gynecology

## 2013-07-15 ENCOUNTER — Ambulatory Visit (INDEPENDENT_AMBULATORY_CARE_PROVIDER_SITE_OTHER): Payer: Medicare Other | Admitting: Obstetrics & Gynecology

## 2013-07-15 ENCOUNTER — Encounter: Payer: Self-pay | Admitting: Obstetrics & Gynecology

## 2013-07-15 VITALS — BP 100/69 | HR 72 | Resp 16 | Ht 66.0 in | Wt 116.0 lb

## 2013-07-15 DIAGNOSIS — A630 Anogenital (venereal) warts: Secondary | ICD-10-CM

## 2013-07-15 DIAGNOSIS — Z1231 Encounter for screening mammogram for malignant neoplasm of breast: Secondary | ICD-10-CM

## 2013-07-15 DIAGNOSIS — B009 Herpesviral infection, unspecified: Secondary | ICD-10-CM

## 2013-07-15 DIAGNOSIS — Z0000 Encounter for general adult medical examination without abnormal findings: Secondary | ICD-10-CM

## 2013-07-15 MED ORDER — VALACYCLOVIR HCL 1 G PO TABS: 1000.0000 mg | ORAL_TABLET | Freq: Every day | ORAL | Status: DC

## 2013-07-15 NOTE — Progress Notes (Signed)
   Subjective:    Patient ID: Erin [name], female    DOB: 12-Oct-1960, 53 y.o.   MRN: [id number]  HPI  She is here for reapplication of TCA to her condyloma.  Review of Systems     Objective:   Physical Exam  Several small areas of both labia and the perianal area treated with TCA.      Assessment & Plan:  As above Mammogram ordered

## 2013-08-03 ENCOUNTER — Ambulatory Visit (HOSPITAL_COMMUNITY): Payer: Medicare Other

## 2013-08-13 ENCOUNTER — Ambulatory Visit (INDEPENDENT_AMBULATORY_CARE_PROVIDER_SITE_OTHER): Payer: Medicare Other | Admitting: Obstetrics & Gynecology

## 2013-08-13 ENCOUNTER — Encounter: Payer: Self-pay | Admitting: Obstetrics & Gynecology

## 2013-08-13 VITALS — BP 102/70 | HR 87 | Resp 16 | Ht 66.0 in | Wt 113.0 lb

## 2013-08-13 DIAGNOSIS — N9089 Other specified noninflammatory disorders of vulva and perineum: Secondary | ICD-10-CM

## 2013-08-13 DIAGNOSIS — Z711 Person with feared health complaint in whom no diagnosis is made: Secondary | ICD-10-CM

## 2013-08-13 NOTE — Progress Notes (Signed)
   Subjective:    Patient ID: Erin [name], female    DOB: February 04, 1961, 53 y.o.   MRN: [id number]  HPI  Denijah is here because she is convinced that she still has condyloma. I do not see any more condyloma and have refused to do anymore TCA treatments. I have offered her a second opinion prn.   Review of Systems     Objective:   Physical Exam        Assessment & Plan:  As above

## 2013-08-17 ENCOUNTER — Ambulatory Visit (HOSPITAL_COMMUNITY): Payer: Medicare Other | Attending: Obstetrics & Gynecology

## 2014-01-29 ENCOUNTER — Ambulatory Visit (INDEPENDENT_AMBULATORY_CARE_PROVIDER_SITE_OTHER): Payer: Medicare Other | Admitting: Obstetrics & Gynecology

## 2014-01-29 ENCOUNTER — Encounter: Payer: Self-pay | Admitting: Obstetrics & Gynecology

## 2014-01-29 VITALS — BP 119/73 | HR 78 | Resp 16 | Ht 67.0 in | Wt 121.0 lb

## 2014-01-29 DIAGNOSIS — N9089 Other specified noninflammatory disorders of vulva and perineum: Secondary | ICD-10-CM

## 2014-01-29 NOTE — Progress Notes (Signed)
   Subjective:    Patient ID: Erin [name], female    DOB: 31-Aug-1960, 53 y.o.   MRN: [id number]  HPI  Erin [name] is here because she was inspecting her vulva/vagina recently and thinks that a wart is present as well as she questions what the tissue is up inside her vagina   Review of Systems She is not sexually active.    Objective:   Physical Exam Moderate atrophy noted No warts on her shaved vulva/vagina The tissue that she was questioning was remnants of her hymenal ring      Assessment & Plan:  Reassurance given RTC 6 months for exam

## 2014-02-15 ENCOUNTER — Encounter: Payer: Self-pay | Admitting: Obstetrics & Gynecology

## 2014-08-17 ENCOUNTER — Other Ambulatory Visit: Payer: Self-pay | Admitting: Obstetrics & Gynecology

## 2014-09-16 DIAGNOSIS — Z79899 Other long term (current) drug therapy: Secondary | ICD-10-CM | POA: Insufficient documentation

## 2015-05-13 ENCOUNTER — Other Ambulatory Visit: Payer: Self-pay | Admitting: Obstetrics & Gynecology

## 2015-05-16 ENCOUNTER — Other Ambulatory Visit: Payer: Self-pay | Admitting: *Deleted

## 2015-05-16 DIAGNOSIS — B009 Herpesviral infection, unspecified: Secondary | ICD-10-CM

## 2015-05-16 MED ORDER — VALACYCLOVIR HCL 1 G PO TABS: ORAL_TABLET | ORAL | Status: DC

## 2015-05-16 NOTE — Telephone Encounter (Signed)
RF request from [facility] pharmacy renewed for Valtrex per Dr Hulan Fray

## 2015-05-31 ENCOUNTER — Ambulatory Visit: Payer: Self-pay | Admitting: Obstetrics & Gynecology

## 2015-08-04 ENCOUNTER — Emergency Department (HOSPITAL_BASED_OUTPATIENT_CLINIC_OR_DEPARTMENT_OTHER)
Admission: EM | Admit: 2015-08-04 | Discharge: 2015-08-04 | Disposition: A | Discharge status: Home / Self Care | Payer: No Typology Code available for payment source | Attending: Emergency Medicine | Admitting: Emergency Medicine

## 2015-08-04 ENCOUNTER — Encounter (HOSPITAL_BASED_OUTPATIENT_CLINIC_OR_DEPARTMENT_OTHER): Payer: Self-pay | Admitting: Emergency Medicine

## 2015-08-04 ENCOUNTER — Emergency Department (HOSPITAL_BASED_OUTPATIENT_CLINIC_OR_DEPARTMENT_OTHER): Payer: No Typology Code available for payment source

## 2015-08-04 DIAGNOSIS — Z8619 Personal history of other infectious and parasitic diseases: Secondary | ICD-10-CM | POA: Insufficient documentation

## 2015-08-04 DIAGNOSIS — Y998 Other external cause status: Secondary | ICD-10-CM | POA: Insufficient documentation

## 2015-08-04 DIAGNOSIS — Z8505 Personal history of malignant neoplasm of liver: Secondary | ICD-10-CM | POA: Insufficient documentation

## 2015-08-04 DIAGNOSIS — Y9389 Activity, other specified: Secondary | ICD-10-CM | POA: Diagnosis not present

## 2015-08-04 DIAGNOSIS — M1990 Unspecified osteoarthritis, unspecified site: Secondary | ICD-10-CM | POA: Insufficient documentation

## 2015-08-04 DIAGNOSIS — Z8742 Personal history of other diseases of the female genital tract: Secondary | ICD-10-CM | POA: Insufficient documentation

## 2015-08-04 DIAGNOSIS — S0990XA Unspecified injury of head, initial encounter: Secondary | ICD-10-CM | POA: Diagnosis not present

## 2015-08-04 DIAGNOSIS — F17210 Nicotine dependence, cigarettes, uncomplicated: Secondary | ICD-10-CM | POA: Diagnosis not present

## 2015-08-04 DIAGNOSIS — Z7982 Long term (current) use of aspirin: Secondary | ICD-10-CM | POA: Insufficient documentation

## 2015-08-04 DIAGNOSIS — Y92410 Unspecified street and highway as the place of occurrence of the external cause: Secondary | ICD-10-CM | POA: Insufficient documentation

## 2015-08-04 DIAGNOSIS — J449 Chronic obstructive pulmonary disease, unspecified: Secondary | ICD-10-CM | POA: Diagnosis not present

## 2015-08-04 DIAGNOSIS — Z79899 Other long term (current) drug therapy: Secondary | ICD-10-CM | POA: Insufficient documentation

## 2015-08-04 DIAGNOSIS — Z8673 Personal history of transient ischemic attack (TIA), and cerebral infarction without residual deficits: Secondary | ICD-10-CM | POA: Insufficient documentation

## 2015-08-04 DIAGNOSIS — F419 Anxiety disorder, unspecified: Secondary | ICD-10-CM | POA: Insufficient documentation

## 2015-08-04 DIAGNOSIS — F329 Major depressive disorder, single episode, unspecified: Secondary | ICD-10-CM | POA: Diagnosis not present

## 2015-08-04 DIAGNOSIS — M542 Cervicalgia: Secondary | ICD-10-CM

## 2015-08-04 DIAGNOSIS — S199XXA Unspecified injury of neck, initial encounter: Secondary | ICD-10-CM | POA: Insufficient documentation

## 2015-08-04 MED ORDER — OXYCODONE HCL 5 MG PO TABS
5.0000 mg | ORAL_TABLET | Freq: Once | ORAL | Status: AC
  Administered 2015-08-04: 5 mg via ORAL

## 2015-08-04 MED ORDER — OXYCODONE HCL 5 MG PO TABS
ORAL_TABLET | ORAL | Status: AC
  Filled 2015-08-04: qty 1

## 2015-08-04 MED ORDER — ONDANSETRON 4 MG PO TBDP
4.0000 mg | ORAL_TABLET | Freq: Once | ORAL | Status: AC
  Administered 2015-08-04: 4 mg via ORAL

## 2015-08-04 MED ORDER — OXYCODONE-ACETAMINOPHEN 5-325 MG PO TABS
1.0000 | ORAL_TABLET | Freq: Once | ORAL | Status: DC
  Filled 2015-08-04: qty 1

## 2015-08-04 MED ORDER — METHOCARBAMOL 500 MG PO TABS
1000.0000 mg | ORAL_TABLET | Freq: Once | ORAL | Status: AC
  Administered 2015-08-04: 1000 mg via ORAL
  Filled 2015-08-04 (×3): qty 2

## 2015-08-04 MED ORDER — ONDANSETRON 4 MG PO TBDP
ORAL_TABLET | ORAL | Status: AC
  Filled 2015-08-04: qty 1

## 2015-08-04 NOTE — ED Notes (Signed)
Pt given cup of ice water per request.  

## 2015-08-04 NOTE — ED Notes (Signed)
Pt amb to restroom with quick steady gait in nad, amb back to room with quick steady gait also.

## 2015-08-04 NOTE — ED Notes (Signed)
Jacubowitz MD at bedside.
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
## 2015-08-04 NOTE — ED Notes (Signed)
Patient transported to CT 
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
## 2016-03-07 ENCOUNTER — Encounter: Payer: Self-pay | Admitting: Obstetrics & Gynecology

## 2016-03-07 ENCOUNTER — Ambulatory Visit: Payer: Medicare Other | Admitting: Obstetrics & Gynecology

## 2016-03-07 VITALS — Ht 67.0 in | Wt 118.0 lb

## 2016-03-07 DIAGNOSIS — N9089 Other specified noninflammatory disorders of vulva and perineum: Secondary | ICD-10-CM

## 2016-03-19 NOTE — Progress Notes (Signed)
   Subjective:    Patient ID: Erin [name], female    DOB: 07/23/1960, 55 y.o.   MRN: [id number]  HPI  55 yo SW lady here because she is concerned that she has vulvar warts again.  Review of Systems     Objective:   Physical Exam Thin WFNAD Breathing, conversing, and ambulating normally Her vulva is shaved as usual. There are a few areas c/w skin tags. This is no change from her last visit. I explained that these are NOT warts but she was insistent that I treat her with TCA. I applied some TCA to the 3 skin tags.       Assessment & Plan:  Skin tags- reassurance that she does not have warts.

## 2016-07-12 DIAGNOSIS — F17210 Nicotine dependence, cigarettes, uncomplicated: Secondary | ICD-10-CM | POA: Insufficient documentation

## 2016-08-06 ENCOUNTER — Other Ambulatory Visit: Payer: Self-pay | Admitting: Obstetrics & Gynecology

## 2016-08-06 DIAGNOSIS — B009 Herpesviral infection, unspecified: Secondary | ICD-10-CM

## 2016-09-24 ENCOUNTER — Ambulatory Visit (HOSPITAL_COMMUNITY): Payer: Self-pay | Admitting: Licensed Clinical Social Worker

## 2016-10-18 ENCOUNTER — Ambulatory Visit (HOSPITAL_COMMUNITY): Payer: Self-pay | Admitting: Licensed Clinical Social Worker

## 2016-10-30 DIAGNOSIS — K4090 Unilateral inguinal hernia, without obstruction or gangrene, not specified as recurrent: Secondary | ICD-10-CM | POA: Insufficient documentation

## 2017-08-13 DIAGNOSIS — F332 Major depressive disorder, recurrent severe without psychotic features: Secondary | ICD-10-CM | POA: Insufficient documentation

## 2017-08-13 DIAGNOSIS — R2241 Localized swelling, mass and lump, right lower limb: Secondary | ICD-10-CM | POA: Insufficient documentation

## 2017-08-13 DIAGNOSIS — L7682 Other postprocedural complications of skin and subcutaneous tissue: Secondary | ICD-10-CM | POA: Insufficient documentation

## 2018-01-22 DIAGNOSIS — M810 Age-related osteoporosis without current pathological fracture: Secondary | ICD-10-CM

## 2018-01-22 HISTORY — DX: Age-related osteoporosis without current pathological fracture: M81.0

## 2018-02-04 DIAGNOSIS — M79671 Pain in right foot: Secondary | ICD-10-CM | POA: Insufficient documentation

## 2018-06-09 DIAGNOSIS — M545 Low back pain, unspecified: Secondary | ICD-10-CM | POA: Insufficient documentation

## 2018-10-27 LAB — HM COLONOSCOPY

## 2019-01-20 ENCOUNTER — Ambulatory Visit (INDEPENDENT_AMBULATORY_CARE_PROVIDER_SITE_OTHER): Payer: Medicare Other

## 2019-01-20 ENCOUNTER — Other Ambulatory Visit: Payer: Self-pay

## 2019-01-20 ENCOUNTER — Encounter: Payer: Self-pay | Admitting: *Deleted

## 2019-01-20 ENCOUNTER — Encounter: Payer: Self-pay | Admitting: Osteopathic Medicine

## 2019-01-20 ENCOUNTER — Ambulatory Visit (INDEPENDENT_AMBULATORY_CARE_PROVIDER_SITE_OTHER): Payer: Medicare Other | Admitting: Osteopathic Medicine

## 2019-01-20 VITALS — BP 129/81 | HR 84 | Temp 98.4°F | Ht 67.0 in | Wt 136.9 lb

## 2019-01-20 DIAGNOSIS — M8580 Other specified disorders of bone density and structure, unspecified site: Secondary | ICD-10-CM | POA: Insufficient documentation

## 2019-01-20 DIAGNOSIS — G8929 Other chronic pain: Secondary | ICD-10-CM

## 2019-01-20 DIAGNOSIS — J449 Chronic obstructive pulmonary disease, unspecified: Secondary | ICD-10-CM

## 2019-01-20 DIAGNOSIS — R519 Headache, unspecified: Secondary | ICD-10-CM

## 2019-01-20 DIAGNOSIS — E782 Mixed hyperlipidemia: Secondary | ICD-10-CM

## 2019-01-20 DIAGNOSIS — Z86010 Personal history of colonic polyps: Secondary | ICD-10-CM

## 2019-01-20 DIAGNOSIS — Z9049 Acquired absence of other specified parts of digestive tract: Secondary | ICD-10-CM

## 2019-01-20 DIAGNOSIS — M79605 Pain in left leg: Secondary | ICD-10-CM

## 2019-01-20 DIAGNOSIS — Z90710 Acquired absence of both cervix and uterus: Secondary | ICD-10-CM

## 2019-01-20 DIAGNOSIS — C229 Malignant neoplasm of liver, not specified as primary or secondary: Secondary | ICD-10-CM

## 2019-01-20 DIAGNOSIS — Z853 Personal history of malignant neoplasm of breast: Secondary | ICD-10-CM

## 2019-01-20 DIAGNOSIS — M25551 Pain in right hip: Secondary | ICD-10-CM | POA: Diagnosis not present

## 2019-01-20 DIAGNOSIS — F17210 Nicotine dependence, cigarettes, uncomplicated: Secondary | ICD-10-CM

## 2019-01-20 DIAGNOSIS — Z7689 Persons encountering health services in other specified circumstances: Secondary | ICD-10-CM

## 2019-01-20 DIAGNOSIS — Z1509 Genetic susceptibility to other malignant neoplasm: Secondary | ICD-10-CM

## 2019-01-20 DIAGNOSIS — Z8669 Personal history of other diseases of the nervous system and sense organs: Secondary | ICD-10-CM

## 2019-01-20 DIAGNOSIS — K219 Gastro-esophageal reflux disease without esophagitis: Secondary | ICD-10-CM | POA: Insufficient documentation

## 2019-01-20 DIAGNOSIS — Z91030 Bee allergy status: Secondary | ICD-10-CM | POA: Diagnosis not present

## 2019-01-20 DIAGNOSIS — N200 Calculus of kidney: Secondary | ICD-10-CM | POA: Insufficient documentation

## 2019-01-20 DIAGNOSIS — Z1501 Genetic susceptibility to malignant neoplasm of breast: Secondary | ICD-10-CM

## 2019-01-20 DIAGNOSIS — M79651 Pain in right thigh: Secondary | ICD-10-CM | POA: Diagnosis not present

## 2019-01-20 DIAGNOSIS — Z780 Asymptomatic menopausal state: Secondary | ICD-10-CM | POA: Insufficient documentation

## 2019-01-20 MED ORDER — DICLOFENAC SODIUM 1 % TD GEL: 4.0000 g | Freq: Four times a day (QID) | TRANSDERMAL | 11 refills | Status: DC

## 2019-01-20 MED ORDER — BEVESPI AEROSPHERE 9-4.8 MCG/ACT IN AERO: 1.0000 | INHALATION_SPRAY | Freq: Two times a day (BID) | RESPIRATORY_TRACT | 11 refills | Status: DC

## 2019-01-20 MED ORDER — BUDESONIDE-FORMOTEROL FUMARATE 160-4.5 MCG/ACT IN AERO: 2.0000 | INHALATION_SPRAY | Freq: Two times a day (BID) | RESPIRATORY_TRACT | 11 refills | Status: DC

## 2019-01-20 NOTE — Progress Notes (Signed)
Received referral for this patient. Chart review shows patient is under the active care of a physician in the [facility] system. Message left x 2 for patient to return call to clarify purpose of referral; second opinion or transfer of care. Will await response from patient.

## 2019-01-20 NOTE — Progress Notes (Signed)
HPI: Erin [name] is a 58 y.o. female who  has a past medical history of Anxiety, Arthritis, ASCUS (atypical squamous cells of undetermined significance) on Pap smear, Blood transfusion (1983), Bursitis, Cancer ([place]), Chronic constipation, Condyloma, COPD (chronic obstructive pulmonary disease) ([place]), Depression, Headache(784.0), Hot flashes, menopausal, HPV (human papilloma virus) infection, Lung mass, Seizures ([place]), and TIA (transient ischemic attack).  she presents to [facility] today, 01/20/19,  for chief complaint of: New to establish care  Leg pain   New patient here to establish care.  Patient had apparently had some issues at [facility].  Was frustrated with care there, apparently had at some point been diagnosed with depression and she reports that doctors there seem to be trying to tie all of her medical problems to this issue.  She would just like a fresh start here and wants to change all of her specialists as well.  Requesting several referrals for:  COPD: Requesting pulmonology specialist.  Patient states that she is taking Symbicort and Bevespi...  History of cancer: History of breast cancer as well as liver cancer.  Would like to establish with a new oncologist.  History of migraine headaches: Would like establish with new neurologist.  Is happy with current OB/GYN care at [facility] down the hall from [place].  New problem: Right leg/hip pain ongoing for the past few months.  Has been interfering with her sleep.  Feels like "it is in the bones" rather than in the joints.  Has been unable to perform her usual exercise activity of walking.     Past medical, surgical, social and family history reviewed:  Patient Active Problem List   Diagnosis Date Noted  . HSV-1 & 2 seropositive 11/24/2012  . BRCA2 genetic carrier 11/04/2012  . Liver cancer ([place]) 11/04/2012  . MIGRAINE HEADACHE 03/02/2008  . BIPOLAR DISORDER UNSPECIFIED 01/27/2008   . INSOMNIA, CHRONIC 01/27/2008  . EATING DISORDER 01/27/2008  . CONSTIPATION, CHRONIC 01/27/2008  . HYPERLIPIDEMIA 12/16/2007  . LEG PAIN, LEFT 12/16/2007  . EDEMA LEG 12/16/2007    Past Surgical History:  Procedure Laterality Date  . ABDOMINOPLASTY    . LAPAROSCOPY  01/04/2011   Procedure: LAPAROSCOPY OPERATIVE;  Surgeon: Juliene Pina C. Hulan Fray, MD;  Location: [place] [facility];  Service: Gynecology;  Laterality: N/A;  . LAPAROSCOPY  06/14   gall bladder and 1/2 liver  . SALPINGOOPHORECTOMY  01/04/2011   Procedure: SALPINGO OOPHERECTOMY;  Surgeon: Myra C. Hulan Fray, MD;  Location: [place] [facility];  Service: Gynecology;  Laterality: Bilateral;  . TOTAL VAGINAL HYSTERECTOMY  58 yrs old    Social History   Tobacco Use  . Smoking status: Current Some Day Smoker    Packs/day: 0.40    Years: 40.00    Pack years: 16.00    Types: Cigarettes  . Smokeless tobacco: Never Used  Substance Use Topics  . Alcohol use: No    Comment: One beer two weeks ago.    Family History  Problem Relation Age of Onset  . Breast cancer Mother        postmenopausal  . Depression Mother   . Drug abuse Mother   . Drug abuse Father   . Alcohol abuse Daughter   . Drug abuse Sister   . Ovarian cancer Neg Hx   . Uterine cancer Neg Hx      Current medication list and allergy/intolerance information reviewed:    Current Outpatient Medications  Medication Sig Dispense Refill  . 5-HTP CAPS Take 1 capsule by mouth  2 (two) times daily.    Marland Kitchen aspirin ([employer] CHILDRENS ASPIRIN) 81 MG chewable tablet Chew 81 mg by mouth.    . busPIRone (BUSPAR) 7.5 MG tablet Take by mouth.    . clonazePAM (KLONOPIN) 0.5 MG tablet Take 0.5 mg by mouth 2 (two) times daily.    . cyclobenzaprine (FLEXERIL) 10 MG tablet Take 10 mg by mouth as needed.    Marland Kitchen EPINEPHrine (EPIPEN 2-PAK) 0.3 mg/0.3 mL IJ SOAJ injection Inject 0.3 mg into the muscle.    . lubiprostone (AMITIZA) 24 MCG capsule TAKE 1 CAPSULE BY MOUTH TWICE A DAY    . oxyCODONE (ROXICODONE) 5 MG immediate  release tablet Take 5 mg by mouth.    . Simethicone 125 MG CAPS Take by mouth.    . topiramate (TOPAMAX) 50 MG tablet Take 50 mg by mouth 2 (two) times daily.      . valACYclovir (VALTREX) 1000 MG tablet TAKE 1 TABLET (1,000 MG TOTAL) BY MOUTH DAILY. 30 tablet 0   No current facility-administered medications for this visit.     Allergies  Allergen Reactions  . Bee Venom Anaphylaxis  . Butalbital-Apap-Caffeine Other (See Comments)    Abd. pain  . Singulair  [Montelukast Sodium] Other (See Comments)    "Sinus infection for months"  . Tylenol [Acetaminophen] Other (See Comments)    Can not take due to patient's liver cancer and resection.  Marland Kitchen Amoxicillin Diarrhea    States liver is compromised so she can not take.   Recardo Evangelist [Pregabalin] Itching      Review of Systems:  Constitutional:  No  fever, no chills, No recent illness, No unintentional weight changes. No significant fatigue.   HEENT: No  headache, no vision change, no hearing change, No sore throat, No  sinus pressure  Cardiac: No  chest pain, No  pressure, No palpitations, No  Orthopnea  Respiratory:  No  shortness of breath. No  Cough  Gastrointestinal: No  abdominal pain, No  nausea, No  vomiting,  No  blood in stool, No  diarrhea, No  constipation   Musculoskeletal: +new myalgia/arthralgia  Skin: No  Rash, No other wounds/concerning lesions  Genitourinary: No  incontinence, No  abnormal genital bleeding, No abnormal genital discharge  Hem/Onc: No  easy bruising/bleeding, No  abnormal lymph node  Endocrine: No cold intolerance,  No heat intolerance. No polyuria/polydipsia/polyphagia   Neurologic: No  weakness, No  dizziness, No  slurred speech/focal weakness/facial droop  Psychiatric: No  concerns with depression, No  concerns with anxiety, No sleep problems, No mood problems  Exam:  BP 129/81 (BP Location: Left Arm, Patient Position: Sitting, Cuff Size: Normal)   Pulse 84   Temp 98.4 F (36.9 C) (Oral)    Ht '5\' 7"'  (1.702 m)   Wt 136 lb 14.4 oz (62.1 kg)   BMI 21.44 kg/m   Constitutional: VS see above. General Appearance: alert, well-developed, well-nourished, NAD  Neck: No masses, trachea midline. No thyroid enlargement. No tenderness/mass appreciated. No lymphadenopathy  Respiratory: Normal respiratory effort. no wheeze, no rhonchi, no rales  Cardiovascular: S1/S2 normal, no murmur, no rub/gallop auscultated. RRR. No lower extremity edema.  Gastrointestinal: Nontender, no masses. No hepatomegaly, no splenomegaly. No hernia appreciated. Bowel sounds normal. Rectal exam deferred.   Musculoskeletal: Gait normal. No clubbing/cyanosis of digits. L hip/leg: neg SLR, neg FABER/FADIR, neg log roll, (+)tenderess to palpation of greater trochanter   Neurological: Normal balance/coordination. No tremor. No cranial nerve deficit on limited exam. Motor and sensation intact  and symmetric. Cerebellar reflexes intact.   Psychiatric: Normal judgment/insight. Normal mood and affect. Oriented x3.    No results found for this or any previous visit (from the past 72 hour(s)).  Dg Hip Unilat With Pelvis 2-3 Views Right  Result Date: 01/20/2019 CLINICAL DATA:  Right hip and leg pain for 3-4 months. No known injury. EXAM: RIGHT FEMUR 2 VIEWS; DG HIP (WITH OR WITHOUT PELVIS) 2-3V RIGHT COMPARISON:  None. FINDINGS: The mineralization and alignment are normal. There is no evidence of acute fracture or dislocation. There is no evidence of femoral head avascular necrosis. The hip and sacroiliac joint spaces are preserved bilaterally. No significant findings at the right knee. The right thigh soft tissues appear unremarkable. IMPRESSION: Normal radiographs of the right hip and thigh. Electronically Signed   By: Richardean Sale M.D.   On: 01/20/2019 17:02   Dg Femur, Min 2 Views Right  Result Date: 01/20/2019 CLINICAL DATA:  Right hip and leg pain for 3-4 months. No known injury. EXAM: RIGHT FEMUR 2 VIEWS; DG HIP  (WITH OR WITHOUT PELVIS) 2-3V RIGHT COMPARISON:  None. FINDINGS: The mineralization and alignment are normal. There is no evidence of acute fracture or dislocation. There is no evidence of femoral head avascular necrosis. The hip and sacroiliac joint spaces are preserved bilaterally. No significant findings at the right knee. The right thigh soft tissues appear unremarkable. IMPRESSION: Normal radiographs of the right hip and thigh. Electronically Signed   By: Richardean Sale M.D.   On: 01/20/2019 17:02     ASSESSMENT/PLAN: The primary encounter diagnosis was Establishing care with new doctor, encounter for. Diagnoses of Mixed hyperlipidemia, Chronic obstructive pulmonary disease, unspecified COPD type ([place]), History of colon polyps, Allergy to bee sting, Cigarette smoker, History of breast cancer, S/P hysterectomy, S/P cholecystectomy, BRCA gene mutation positive, Malignant neoplasm of liver, unspecified liver malignancy type ([place]), History of seizure disorder, Chronic nonintractable headache, unspecified headache type, History of migraine, and Left leg pain were also pertinent to this visit.  Referrals placed for specialists as requested. Recommended follow-up with sports medicine to address leg pain.  Exam seems more consistent with a hip issue, certainly possibility for trochanteric bursitis but patient adamantly denies that this is a possibility.   Orders Placed This Encounter  Procedures  . DG FEMUR, MIN 2 VIEWS RIGHT  . DG HIP UNILAT WITH PELVIS 2-3 VIEWS RIGHT  . Ambulatory referral to Hematology / Oncology  . Ambulatory referral to Pulmonology  . Ambulatory referral to Neurology    Meds ordered this encounter  Medications  . Glycopyrrolate-Formoterol (BEVESPI AEROSPHERE) 9-4.8 MCG/ACT AERO    Sig: Inhale 1 puff into the lungs 2 (two) times daily.    Dispense:  10.7 g    Refill:  11  . budesonide-formoterol (SYMBICORT) 160-4.5 MCG/ACT inhaler    Sig: Inhale 2 puffs into the lungs 2  (two) times daily.    Dispense:  2 Inhaler    Refill:  11  . diclofenac sodium (VOLTAREN) 1 % GEL    Sig: Apply 4 g topically 4 (four) times daily. To affected joint.    Dispense:  100 g    Refill:  11         Visit summary with medication list and pertinent instructions was printed for patient to review. All questions at time of visit were answered - patient instructed to contact office with any additional concerns or updates. ER/RTC precautions were reviewed with the patient.     Please note: voice  recognition software was used to produce this document, and typos may escape review. Please contact Dr. Sheppard Coil for any needed clarifications.     Follow-up plan: Return in about 6 months (around 07/21/2019) for [place] (call week prior to visit for lab orders).

## 2019-01-27 ENCOUNTER — Encounter: Payer: Self-pay | Admitting: Sports Medicine

## 2019-01-27 ENCOUNTER — Encounter: Payer: Self-pay | Admitting: *Deleted

## 2019-01-27 ENCOUNTER — Ambulatory Visit (INDEPENDENT_AMBULATORY_CARE_PROVIDER_SITE_OTHER): Payer: Medicare Other | Admitting: Sports Medicine

## 2019-01-27 ENCOUNTER — Other Ambulatory Visit: Payer: Self-pay

## 2019-01-27 DIAGNOSIS — G8929 Other chronic pain: Secondary | ICD-10-CM | POA: Insufficient documentation

## 2019-01-27 DIAGNOSIS — M25552 Pain in left hip: Secondary | ICD-10-CM | POA: Insufficient documentation

## 2019-01-27 DIAGNOSIS — M7061 Trochanteric bursitis, right hip: Secondary | ICD-10-CM

## 2019-01-27 MED ORDER — DICLOFENAC SODIUM 1 % TD GEL: 4.0000 g | Freq: Four times a day (QID) | TRANSDERMAL | 11 refills | Status: DC

## 2019-01-27 MED ORDER — BUDESONIDE-FORMOTEROL FUMARATE 160-4.5 MCG/ACT IN AERO: 2.0000 | INHALATION_SPRAY | Freq: Two times a day (BID) | RESPIRATORY_TRACT | 11 refills | Status: DC

## 2019-01-27 MED ORDER — BEVESPI AEROSPHERE 9-4.8 MCG/ACT IN AERO: 1.0000 | INHALATION_SPRAY | Freq: Two times a day (BID) | RESPIRATORY_TRACT | 11 refills | Status: DC

## 2019-01-27 NOTE — Progress Notes (Signed)
Subjective:    CC: R hip pain  HPI: Erin [name] is a 58 yo female smoker with a medical hx significant for DDD of the lumbar spine presenting today following 3 months of chronic R hip pain. She reports worsening with increased activity and walking and no relief with rest. Her pain is localized between iliac crest and mid-femur. She has not experienced any radiation of pain, paraesthesias, or numbness. Her sleep has been limited to ~2 hrs per night due to pain. Her smoking has been reduced to ~1/2 pk per day and she does not desire treatment at this time.  I reviewed the past medical history, family history, social history, surgical history, and allergies today and no changes were needed.  Please see the problem list section below in epic for further details.  Past Medical History: Past Medical History:  Diagnosis Date  . Anxiety   . Arthritis    hands and feet  . ASCUS (atypical squamous cells of undetermined significance) on Pap smear   . Blood transfusion 1983   In [place]  . Bursitis   . Cancer ([place])    liver  . Chronic constipation   . Condyloma   . COPD (chronic obstructive pulmonary disease) ([place])   . Depression    per pt h/o depression- no meds currently. Dx of bi-polar depression in EPIC  . Headache(784.0)    migraines  . Hot flashes, menopausal   . HPV (human papilloma virus) infection   . Lung mass   . Seizures ([place])    unknown reason for seizures- last seizurein Sept 2012  . TIA (transient ischemic attack)    Past Surgical History: Past Surgical History:  Procedure Laterality Date  . ABDOMINOPLASTY    . LAPAROSCOPY  01/04/2011   Procedure: LAPAROSCOPY OPERATIVE;  Surgeon: Juliene Pina C. Hulan Fray, MD;  Location: [facility];  Service: Gynecology;  Laterality: N/A;  . LAPAROSCOPY  06/14   gall bladder and 1/2 liver  . SALPINGOOPHORECTOMY  01/04/2011   Procedure: SALPINGO OOPHERECTOMY;  Surgeon: Myra C. Hulan Fray, MD;  Location: [facility];  Service: Gynecology;  Laterality: Bilateral;  . TOTAL  VAGINAL HYSTERECTOMY  58 yrs old   Social History: Social History   Socioeconomic History  . Marital status: Divorced    Spouse name: Not on file  . Number of children: 2  . Years of education: Not on file  . Highest education level: Not on file  Occupational History  . Not on file  Social Needs  . Financial resource strain: Not on file  . Food insecurity    Worry: Not on file    Inability: Not on file  . Transportation needs    Medical: Not on file    Non-medical: Not on file  Tobacco Use  . Smoking status: Current Some Day Smoker    Packs/day: 0.40    Years: 40.00    Pack years: 16.00    Types: Cigarettes  . Smokeless tobacco: Never Used  Substance and Sexual Activity  . Alcohol use: No    Comment: One beer two weeks ago.  . Drug use: Not Currently    Types: Marijuana, Cocaine    Comment: history of use  . Sexual activity: Not Currently    Partners: Male  Lifestyle  . Physical activity    Days per week: Not on file    Minutes per session: Not on file  . Stress: Not on file  Relationships  . Social Herbalist on phone: Not  on file    Gets together: Not on file    Attends religious service: Not on file    Active member of club or organization: Not on file    Attends meetings of clubs or organizations: Not on file    Relationship status: Not on file  Other Topics Concern  . Not on file  Social History Narrative  . Not on file   Family History: Family History  Problem Relation Age of Onset  . Breast cancer Mother        postmenopausal  . Depression Mother   . Drug abuse Mother   . Drug abuse Father   . Lung cancer Father   . Alcohol abuse Daughter   . Breast cancer Daughter   . Drug abuse Sister   . Breast cancer Maternal Aunt   . Ovarian cancer Neg Hx   . Uterine cancer Neg Hx    Allergies: Allergies  Allergen Reactions  . Bee Venom Anaphylaxis  . Butalbital-Apap-Caffeine Other (See Comments)    Abd. pain  . Singulair  [Montelukast  Sodium] Other (See Comments)    "Sinus infection for months"  . Tylenol [Acetaminophen] Other (See Comments)    Can not take due to patient's liver cancer and resection.  Marland Kitchen Amoxicillin Diarrhea    States liver is compromised so she can not take.   Recardo Evangelist [Pregabalin] Itching   Medications: See med rec.  Review of Systems: No fevers, chills, night sweats, weight loss, chest pain, or shortness of breath.   Objective:    General: Appears older than stated age, well nourished, and in no acute distress.  Neuro: Alert and oriented x3, extra-ocular muscles intact, sensation grossly intact.  HEENT: Normocephalic, atraumatic, pupils equal round reactive to light. Skin: Warm and dry, no rashes. Cardiac: Regular rate and rhythm, no lower extremity edema.  Respiratory: Not using accessory muscles, speaking in full sentences.  R Hip: ROM IR: 45 Deg, ER: 45 Deg, Flexion: 120 Deg, Extension: 100 Deg, Abduction: 45 Deg, Adduction: 45 Deg Tenderness to passive flexion of the R hip. Strength IR: 5/5, ER: 5/5, Flexion: 5/5, Extension: 5/5, Abduction: 5/5, Adduction: 5/5 Pelvic alignment unremarkable to inspection and palpation. Standing hip rotation and gait without trendelenburg sign / unsteadiness. Tender to palpation over the greater trochanter. No tenderness over piriformis. No SI joint tenderness and normal minimal SI movement.  A/P: [place] presentation of persistent R sided localized hip pain that is worse with activity and tender to palpation over the greater trochanter is likely due to trochanteric bursitis. The bursa was injected with lidocaine and steroid which resulted in rapid relief of symptoms.   Impression and Recommendations:    No problem-specific Assessment & Plan notes found for this encounter.   ___________________________________________ Gwen Her. Dianah Field, M.D., ABFM., CAQSM. Primary Care and Sports Medicine [facility]  Adjunct Professor  of [employer]

## 2019-01-27 NOTE — Assessment & Plan Note (Signed)
3 months of symptoms, injection. X-rays unremarkable. Formal PT. Return to see me in 6 weeks.

## 2019-01-27 NOTE — Progress Notes (Signed)
Multiple calls and three messages left in an attempt to schedule this patient for a new patient appointment after a referral received from Dr Sheppard Coil. No contact made and no return phone call received.   Will send a letter to the patient home asking her to call the office is she wishes to be seen. Otherwise, referral will be closed.

## 2019-01-28 ENCOUNTER — Telehealth: Payer: Self-pay | Admitting: *Deleted

## 2019-01-28 NOTE — Telephone Encounter (Signed)
Left patient a message to call and answer screening questions prior to appointment on 02/02/2019.

## 2019-02-02 ENCOUNTER — Ambulatory Visit (INDEPENDENT_AMBULATORY_CARE_PROVIDER_SITE_OTHER): Payer: Medicare Other | Admitting: Obstetrics & Gynecology

## 2019-02-02 ENCOUNTER — Encounter: Payer: Self-pay | Admitting: Obstetrics & Gynecology

## 2019-02-02 ENCOUNTER — Other Ambulatory Visit: Payer: Self-pay

## 2019-02-02 DIAGNOSIS — Z1231 Encounter for screening mammogram for malignant neoplasm of breast: Secondary | ICD-10-CM

## 2019-02-02 DIAGNOSIS — Z01419 Encounter for gynecological examination (general) (routine) without abnormal findings: Secondary | ICD-10-CM

## 2019-02-02 NOTE — Progress Notes (Signed)
Subjective:    Erin [name] is a 58 y.o. single P2 (42 and 47, 6 grands) who presents for an annual exam. The patient has no complaints today, thinks that she may have genital warts returned.  The patient is not currently sexually active, for years. GYN screening history: last pap: was normal. The patient wears seatbelts: yes. The patient participates in regular exercise: yes. Has the patient ever been transfused or tattooed?: yes. The patient reports that there is not domestic violence in her life.   Menstrual History: OB History    Gravida  4   Para  2   Term  2   Preterm      AB  2   Living  2     SAB      TAB      Ectopic      Multiple      Live Births              No LMP recorded. Patient has had a hysterectomy.    The following portions of the patient's history were reviewed and updated as appropriate: allergies, current medications, past family history, past medical history, past social history, past surgical history and problem list.  Review of Systems Pertinent items are noted in HPI.   Mammogram UTD this year Declines flu vaccine FH- + breast mom, maunt, daughter + BRCA S/p colonoscopy UTD   Objective:    Ht _0  (1.702 m)   Wt 137 lb (62.1 kg)   BMI 21.46 kg/m   General Appearance:    Alert, cooperative, no distress, appears stated age  Head:    Normocephalic, without obvious abnormality, atraumatic  Eyes:    PERRL, conjunctiva/corneas clear, EOM's intact, fundi    benign, both eyes  Ears:    Normal TM's and external ear canals, both ears  Nose:   Nares normal, septum midline, mucosa normal, no drainage    or sinus tenderness  Throat:   Lips, mucosa, and tongue normal; teeth and gums normal  Neck:   Supple, symmetrical, trachea midline, no adenopathy;    thyroid:  no enlargement/tenderness/nodules; no carotid   bruit or JVD  Back:     Symmetric, no curvature, ROM normal, no CVA tenderness  Lungs:     Clear to auscultation bilaterally,  respirations unlabored  Chest Wall:    No tenderness or deformity   Heart:    Regular rate and rhythm, S1 and S2 normal, no murmur, rub   or gallop  Breast Exam:    No tenderness, masses, or nipple abnormality  Abdomen:     Soft, non-tender, bowel sounds active all four quadrants,    no masses, no organomegaly  Genitalia:    Normal female without lesion, discharge or tenderness, moderate atrophy, 5 small raised areas (looks more like skin tags than warts). I put TCA on them and took a photo on her phone.     Extremities:   Extremities normal, atraumatic, no cyanosis or edema  Pulses:   2+ and symmetric all extremities  Skin:   Skin color, texture, turgor normal, no rashes or lesions  Lymph nodes:   Cervical, supraclavicular, and axillary nodes normal  Neurologic:   CNII-XII intact, normal strength, sensation and reflexes    throughout  .    Assessment:    Healthy female exam.   + BRCA Skin lesions Plan:     breast MRI   TCA treatment Follow up 1 month If lesions still present, rec  biopsy

## 2019-02-03 ENCOUNTER — Ambulatory Visit (INDEPENDENT_AMBULATORY_CARE_PROVIDER_SITE_OTHER): Payer: Medicare Other | Admitting: Critical Care Medicine

## 2019-02-03 ENCOUNTER — Encounter: Payer: Self-pay | Admitting: Critical Care Medicine

## 2019-02-03 VITALS — BP 122/66 | HR 69 | Temp 97.2°F | Ht 66.0 in | Wt 137.6 lb

## 2019-02-03 DIAGNOSIS — Z720 Tobacco use: Secondary | ICD-10-CM

## 2019-02-03 DIAGNOSIS — J449 Chronic obstructive pulmonary disease, unspecified: Secondary | ICD-10-CM

## 2019-02-03 DIAGNOSIS — J431 Panlobular emphysema: Secondary | ICD-10-CM

## 2019-02-03 DIAGNOSIS — R918 Other nonspecific abnormal finding of lung field: Secondary | ICD-10-CM | POA: Diagnosis not present

## 2019-02-03 DIAGNOSIS — Z23 Encounter for immunization: Secondary | ICD-10-CM | POA: Diagnosis not present

## 2019-02-03 MED ORDER — ALBUTEROL SULFATE HFA 108 (90 BASE) MCG/ACT IN AERS: 2.0000 | INHALATION_SPRAY | Freq: Four times a day (QID) | RESPIRATORY_TRACT | 11 refills | Status: DC | PRN

## 2019-02-03 MED ORDER — BUDESON-GLYCOPYRROL-FORMOTEROL 160-9-4.8 MCG/ACT IN AERO: 2.0000 | INHALATION_SPRAY | Freq: Two times a day (BID) | RESPIRATORY_TRACT | 11 refills | Status: DC

## 2019-02-03 NOTE — Patient Instructions (Addendum)
Thank you for visiting Dr. Carlis Abbott at [facility]. We recommend the following: Orders Placed This Encounter  Procedures  . CT Chest Wo Contrast   Orders Placed This Encounter  Procedures  . CT Chest Wo Contrast    November 2020 as 1 year follow up scan    Standing Status:   Future    Standing Expiration Date:   04/04/2020    Order Specific Question:   ** REASON FOR EXAM (FREE TEXT)    Answer:   follow up pulm nodules, prev scans at [place] Specific Question:   Is patient pregnant?    Answer:   No    Order Specific Question:   Preferred imaging location?    Answer:   [facility]    Order Specific Question:   Radiology Contrast Protocol - do NOT remove file path    Answer:   [email or web address]    Meds ordered this encounter  Medications  . Budeson-Glycopyrrol-Formoterol 160-9-4.8 MCG/ACT AERO    Sig: Inhale 2 puffs into the lungs 2 (two) times daily.    Dispense:  160 g    Refill:  11  . albuterol (VENTOLIN HFA) 108 (90 Base) MCG/ACT inhaler    Sig: Inhale 2 puffs into the lungs every 6 (six) hours as needed for wheezing or shortness of breath.    Dispense:  6.7 g    Refill:  11    When you start the new inhaler, please stop the Bevespi and Symbicort inhalers.  Return in about 3 months (around 05/06/2019).    Please do your part to reduce the spread of COVID-19.   Please do your part to reduce the spread of COVID-19.  It is very important that you stop smoking or vaping. This is the single most important thing that you can do to improve your lung health.   S = Set a quit date. T = Tell family, friends, and the people around you that you plan to quit. A = Anticipate or plan ahead for the tough times you'll face while quitting. R = Remove cigarettes and other tobacco products from your home, car, and work. T = Talk to [place] about getting help to quit.  If you need help, please reach out to our office or the smoking  cessation resources available: [place] Smoking Cessation Class: IE:5250201 1-800-QUIT-NOW www.BeTobaccoFree.gov

## 2019-02-03 NOTE — Progress Notes (Signed)
Synopsis: Referred in October 2020 for COPD by Emeterio Reeve, DO.   Subjective:   PATIENT ID: Erin [name] GENDER: female DOB: 1960/10/18, MRN: [id number]  Chief Complaint  Patient presents with   Consult    Referred by PCP for COPD.      Erin [name] is a 58 year old woman with a history of COPD diagnosed by spirometry in 2018.  She is previously a patient of Dr. Michela Pitcher at [facility].  At her last pulmonary visit in July she was on Bevespi but had uncontrolled dyspnea attributed to increased smoking and deconditioning.  At that time they were considering escalating therapy to triple inhaled therapy.  She is a current smoker, 40 years x 0.5 ppd.  She has a history of cholangiocarcinoma with liver involvement, status post cholecystectomy and partial liver resection.  She is undergoing surveillance with her oncologist Dr. Bridgett Larsson at [facility].  She has a family history notable for lung cancer in her father.  Her current inhaler regimen includes Symbicort twice daily and Bevespi once daily, and the Symbicort was added recently.  She has been out of albuterol, but feels as though she needs it, especially when she is exercising.  Her baseline symptoms include wheezing on most days, dyspnea when she is walking, and recently due to weather changes and allergies she has had postnasal drip and coughing.  She does not have sputum production.  She regularly walks on her treadmill, about 4 miles per day.  She is not interested in taking allergy medications.  She has never been hospitalized for exacerbations and has never required prednisone as an outpatient.  She continues to smoke about 7 cigarettes a day, down from her 1 pack/day at the heaviest.  She has been smoking since she was a child, about 45 years.  In the past to try and quit she has attempted Chantix, which gave her bad dreams, nicotine replacement patches and gum (the gum in her mouth swell), hypnosis, and magnet  therapy.  In the past she has always started back to smoking due to "willpower".  She feels that her anxiety is too uncontrolled to try and quit again right now.  She has been staying at home without many visitors and has been trying to avoid exposures to COVID-19.  She has not yet had her flu vaccine this year.      Past Medical History:  Diagnosis Date   Anxiety    Arthritis    hands and feet   ASCUS (atypical squamous cells of undetermined significance) on Pap smear    Blood transfusion 1983   In [place]   BRCA2 gene mutation positive 2014   Bursitis    Cancer ([place])    liver   Chronic constipation    Condyloma    COPD (chronic obstructive pulmonary disease) ([place])    Depression    per pt h/o depression- no meds currently. Dx of bi-polar depression in EPIC   Headache(784.0)    migraines   Hot flashes, menopausal    HPV (human papilloma virus) infection    Lung nodule, multiple    Osteopenia    Seizures ([place])    unknown reason for seizures- last seizurein Sept 2012   TIA (transient ischemic attack)      Family History  Problem Relation Age of Onset   Breast cancer Mother        postmenopausal   Depression Mother    Drug abuse Mother    Drug abuse  Father    Lung cancer Father    Alcohol abuse Daughter    Breast cancer Daughter    Drug abuse Sister    Breast cancer Maternal Aunt    Ovarian cancer Neg Hx    Uterine cancer Neg Hx      Past Surgical History:  Procedure Laterality Date   ABDOMINOPLASTY     LAPAROSCOPY  01/04/2011   Procedure: LAPAROSCOPY OPERATIVE;  Surgeon: Juliene Pina C. Hulan Fray, MD;  Location: [facility];  Service: Gynecology;  Laterality: N/A;   LAPAROSCOPY  06/14   gall bladder and 1/2 liver   SALPINGOOPHORECTOMY  01/04/2011   Procedure: SALPINGO OOPHERECTOMY;  Surgeon: Myra C. Hulan Fray, MD;  Location: [facility];  Service: Gynecology;  Laterality: Bilateral;   TOTAL VAGINAL HYSTERECTOMY  58 yrs old    Social History    Socioeconomic History   Marital status: Divorced    Spouse name: Not on file   Number of children: 2   Years of education: Not on file   Highest education level: Not on file  Occupational History   Not on file  Social Needs   Financial resource strain: Not on file   Food insecurity    Worry: Not on file    Inability: Not on file   Transportation needs    Medical: Not on file    Non-medical: Not on file  Tobacco Use   Smoking status: Current Some Day Smoker    Packs/day: 1.00    Years: 40.00    Pack years: 40.00    Types: Cigarettes   Smokeless tobacco: Never Used  Substance and Sexual Activity   Alcohol use: No    Comment: One beer two weeks ago.   Drug use: Not Currently    Types: Marijuana, Cocaine    Comment: history of use   Sexual activity: Not Currently    Partners: Male  Lifestyle   Physical activity    Days per week: Not on file    Minutes per session: Not on file   Stress: Not on file  Relationships   Social connections    Talks on phone: Not on file    Gets together: Not on file    Attends religious service: Not on file    Active member of club or organization: Not on file    Attends meetings of clubs or organizations: Not on file    Relationship status: Not on file   Intimate partner violence    Fear of current or ex partner: Not on file    Emotionally abused: Not on file    Physically abused: Not on file    Forced sexual activity: Not on file  Other Topics Concern   Not on file  Social History Narrative   Not on file     Allergies  Allergen Reactions   Bee Venom Anaphylaxis   Butalbital-Apap-Caffeine Other (See Comments)    Abd. pain   Singulair  [Montelukast Sodium] Other (See Comments)    "Sinus infection for months"   Tylenol [Acetaminophen] Other (See Comments)    Can not take due to patient's liver cancer and resection.   Amoxicillin Diarrhea    States liver is compromised so she can not take.    Lyrica  [Pregabalin] Itching     Immunization History  Administered Date(s) Administered   Influenza,inj,Quad PF,6+ Mos 02/03/2019   Pneumococcal Polysaccharide-23 02/03/2015   Tdap 05/08/2010   Zoster 01/04/2014    Outpatient Medications Prior to Visit  Medication Sig  Dispense Refill   budesonide-formoterol (SYMBICORT) 160-4.5 MCG/ACT inhaler Inhale 2 puffs into the lungs 2 (two) times daily. 2 Inhaler 11   diclofenac sodium (VOLTAREN) 1 % GEL Apply 4 g topically 4 (four) times daily. To affected joint. 100 g 11   Glycopyrrolate-Formoterol (BEVESPI AEROSPHERE) 9-4.8 MCG/ACT AERO Inhale 1 puff into the lungs 2 (two) times daily. (Patient not taking: Reported on 02/03/2019) 10.7 g 11   No facility-administered medications prior to visit.     Review of Systems  Constitutional: Negative for chills, fever and weight loss.  HENT: Positive for congestion.        Postnasal drip  Eyes: Negative.   Respiratory: Positive for cough. Negative for sputum production, shortness of breath and wheezing.   Cardiovascular: Negative for chest pain and leg swelling.  Gastrointestinal: Negative for heartburn, nausea and vomiting.       Recent episode of colitis-symptoms resolved.  Genitourinary: Negative.   Musculoskeletal: Negative for joint pain and myalgias.  Skin: Negative for rash.  Neurological: Negative for dizziness and focal weakness.  Psychiatric/Behavioral: The patient is nervous/anxious.      Objective:   Vitals:   02/03/19 0853  BP: 122/66  Pulse: 69  Temp: (!) 97.2 F (36.2 C)  TempSrc: Oral  SpO2: 99%  Weight: 137 lb 9.6 oz (62.4 kg)  Height: _0  (1.676 m)   99% on  RA BMI Readings from Last 3 Encounters:  02/03/19 22.21 kg/m  02/02/19 21.46 kg/m  01/27/19 21.61 kg/m   Wt Readings from Last 3 Encounters:  02/03/19 137 lb 9.6 oz (62.4 kg)  02/02/19 137 lb (62.1 kg)  01/27/19 138 lb (62.6 kg)    Physical Exam Vitals signs reviewed.  Constitutional:       Appearance: Normal appearance. She is not ill-appearing or diaphoretic.  HENT:     Head: Normocephalic and atraumatic.     Nose:     Comments: Deferred due to masking requirement.    Mouth/Throat:     Comments: Deferred due to masking requirement. Eyes:     General: No scleral icterus. Neck:     Musculoskeletal: Neck supple.  Cardiovascular:     Rate and Rhythm: Normal rate and regular rhythm.     Heart sounds: No murmur.  Pulmonary:     Comments: Breathing comfortably on room air, no conversational dyspnea.  Faint rhonchi on the right, no wheezing or rales. Abdominal:     General: There is no distension.     Palpations: Abdomen is soft.     Tenderness: There is no abdominal tenderness.  Musculoskeletal:        General: No swelling or deformity.  Lymphadenopathy:     Cervical: No cervical adenopathy.  Skin:    General: Skin is warm and dry.     Findings: No rash.  Neurological:     Mental Status: She is alert.     Motor: No weakness.     Coordination: Coordination normal.  Psychiatric:        Mood and Affect: Mood normal.        Behavior: Behavior normal.       Chest Imaging- films reviewed: CT C-spine 08/04/2015- lung images reviewed- panlobular emphysema, apical scarring  CT chest 03/11/2018-report reviewed-emphysema, apical scarring, small lung nodules, the largest being 5 mm in diameter.  No mediastinal or hilar adenopathy.  Postsurgical changes in the right hepatic lobe and small hypodensities in the left hepatic lobe-stable.  Per CT report as far back as May  2014 there has been a right upper lobe 5 mm nodule.  Pulmonary Functions Testing Results: No flowsheet data found. Spirometry from [facility] (no standards available for comparison of FEV1 and FVC)- both read as moderate obstruction 10/20/2018: FVC 3.29 L FEV1 1.89 L Ratio 57%  04/21/2018: FVC 2.85 L FEV1 1.57 L Ratio 55%  03/17/2018: FVC 2.37 L FEV1 1.25 L Ratio 53%  10/30/2016: 6-minute walk  test-97% on room air at rest, desaturated to 90% on room air during ambulation and recovered to 93% on room air by the end of the test.  Ambulated 660 feet.  Pathology (from [facility]): 2014 liver masses- cholangiocarcinoma  Echocardiogram 08/03/2016: Outside report reviewed LVEF 60 to 65%, no regional wall motion abnormalities, normal diastolic function.  Normal LA, RV, RA.  Mild AR, trace MR     Assessment & Plan:     ICD-10-CM   1. Panlobular emphysema (HCC)  J43.1 Budeson-Glycopyrrol-Formoterol 160-9-4.8 MCG/ACT AERO    albuterol (VENTOLIN HFA) 108 (90 Base) MCG/ACT inhaler  2. Lung nodules  R91.8 CT Chest Wo Contrast  3. Tobacco abuse  Z72.0 CT Chest Wo Contrast  4. COPD with chronic bronchitis and emphysema (HCC)  J44.9 Budeson-Glycopyrrol-Formoterol 160-9-4.8 MCG/ACT AERO    albuterol (VENTOLIN HFA) 108 (90 Base) MCG/ACT inhaler  5. Abnormal findings on diagnostic imaging of lung  R91.8 CT Chest Wo Contrast  6. Need for immunization against influenza  Z23 Flu Vaccine QUAD 36+ mos IM    COPD with panlobular emphysema-GOLD group A.  No previous exacerbations. -Flu shot today -Up-to-date on pneumococcal 23 vaccine; needs repeat vaccination next year and again 5 years later after age 26 -Will simplify inhaler regimen to [facility]- 2 puffs twice daily; she can stop both other inhalers when she starts this.  Her current regimen includes 2 inhalers with LABA. -Albuterol as needed-refills prescribed -Continue mask wearing, social distancing, handwashing, and avoiding sick contacts.  She understands that her risk of adverse reaction to pulmonary viruses is increased due to her chronic lung disease. -Encouraged to continue her effort to maintain her exercise tolerance.  Tobacco abuse -Spent greater than 10 minutes discussing the risks of ongoing tobacco use, benefits of cutting back or quitting, and options to help with quitting.  Given her previous intolerance to Chantix and history of  seizures precluding use of buspirone, I think her best option would be nicotine replacement therapy with aggressive planning for retraining her habits.  At this time she not feel prepared to try quitting again due to anxiety.  She will let us know what we can do to help her quitting efforts when she is ready. -Congratulated her efforts on cutting back on the amount she smokes  Pulmonary nodules-the largest in the right upper lobe appears stable for many years based on review of outside reports -Needs follow-up and lung cancer screening CT due to her ongoing smoking.  She has been requested to bring a disk of her most recent CT from [facility] to have loaded into the system for comparison.  RTC in 3 months.  Current Outpatient Medications:    budesonide-formoterol (SYMBICORT) 160-4.5 MCG/ACT inhaler, Inhale 2 puffs into the lungs 2 (two) times daily., Disp: 2 Inhaler, Rfl: 11   diclofenac sodium (VOLTAREN) 1 % GEL, Apply 4 g topically 4 (four) times daily. To affected joint., Disp: 100 g, Rfl: 11   albuterol (VENTOLIN HFA) 108 (90 Base) MCG/ACT inhaler, Inhale 2 puffs into the lungs every 6 (six) hours as needed for wheezing  or shortness of breath., Disp: 6.7 g, Rfl: 11   Budeson-Glycopyrrol-Formoterol 160-9-4.8 MCG/ACT AERO, Inhale 2 puffs into the lungs 2 (two) times daily., Disp: 160 g, Rfl: 11   Glycopyrrolate-Formoterol (BEVESPI AEROSPHERE) 9-4.8 MCG/ACT AERO, Inhale 1 puff into the lungs 2 (two) times daily. (Patient not taking: Reported on 02/03/2019), Disp: 10.7 g, Rfl: 11   Julian Hy, DO [facility] 02/03/2019 9:44 AM

## 2019-02-05 ENCOUNTER — Encounter: Payer: Self-pay | Admitting: *Deleted

## 2019-02-05 NOTE — Progress Notes (Signed)
Received a voicemail message from patient stating she'd gotten my letter and she was reaching out to scheduled on 02/04/2019. I made several attempts yesterday, leaving one voicemail message, to reach the patient and these were all unsuccessful. Again today, I made several attempts, again leaving one voicemail message and haven't made verbal contact with patient. I will continue to try tomorrow to reach patient.

## 2019-02-06 ENCOUNTER — Encounter: Payer: Self-pay | Admitting: *Deleted

## 2019-02-06 ENCOUNTER — Telehealth: Payer: Self-pay | Admitting: Critical Care Medicine

## 2019-02-06 DIAGNOSIS — J431 Panlobular emphysema: Secondary | ICD-10-CM

## 2019-02-06 NOTE — Progress Notes (Signed)
Due to being unable to reach patient over several days, I called the patient's alternative contact person and asked that they call the patient and ask them to call me.  Patient received message and called me. She states her phone has never rung and she has received none of my voice mails. She initially was upset that I contact her contact and hadn't called her, but I explained all the attempts made and apparent issue with connecting person to person. She was then okay, but confused at why my calls have not come through.  Introduced myself as the Loss adjuster, chartered and explained our new patient process. Reviewed the reason for their referral and scheduled their new patient appointment along with labs. Provided address and directions to the office including call back phone number. Reviewed with patient any concerns they may have or any possible barriers to attending their appointment.   Informed patient about my role as a navigator and that I will meet with them prior to their New Patient appointment and more fully discuss what services I can provide. At this time patient has no further questions or needs. She is an established patient with [facility], post treatment but would like to transfer care here.   She has my number and the main office number to call with any concerns or questions. She understands that at this time, it seems like I will be unable to connect with her directly. IT ticket placed to investigate possible issues on my end.

## 2019-02-06 NOTE — Telephone Encounter (Signed)
Located CD, it is in a manila envelope and placed in Dr. Ainsley Spinner mailbox.  Called and spoke to patient and let her know that Dr. Carlis Abbott is not back in the office until next week and that we have her CD and will notify Dr.  Patient stated there is not a rush she just wanted to make sure the doctor received the CD.  Nothing further needed at this time.   Routing to Dr. Carlis Abbott and Ander Purpura.

## 2019-02-09 ENCOUNTER — Telehealth: Payer: Self-pay | Admitting: Critical Care Medicine

## 2019-02-09 NOTE — Telephone Encounter (Signed)
Medication name and strength: Breztri Provider: Dr. Carlis Abbott Pharmacy: [facility] in [place] on [address].  Patient insurance ID:  Phone:   Fax:   Was the PA started on [facility]?: Yes If yes, please enter the Key: [id number] Timeframe for approval/denial: 72 hours

## 2019-02-11 NOTE — Telephone Encounter (Signed)
Erin [name],  I just realized that yesterday I didn't have a disk anywhere. Do you know where this went?  Erin [name]

## 2019-02-11 NOTE — Telephone Encounter (Signed)
[name] were you able to put this in Dr. Ainsley Spinner look at folder? Last I saw it was in her mailbox up front.

## 2019-02-12 NOTE — Telephone Encounter (Signed)
PA has been approved until 04/15/2020 according to Covermymeds.

## 2019-02-12 NOTE — Telephone Encounter (Addendum)
Disc is in Dr. Anell Barr bin in [place]. She will return to Office November 2

## 2019-02-12 NOTE — Telephone Encounter (Signed)
Erin [name] is approved for a non-formulary exception through 04/15/2020.

## 2019-02-16 NOTE — Telephone Encounter (Signed)
Disc has been given to Dr. Carlis Abbott

## 2019-02-17 ENCOUNTER — Ambulatory Visit
Admission: RE | Admit: 2019-02-17 | Discharge: 2019-02-17 | Disposition: A | Discharge status: Home / Self Care | Payer: Self-pay | Source: Ambulatory Visit | Attending: Critical Care Medicine | Admitting: Critical Care Medicine

## 2019-02-17 DIAGNOSIS — J431 Panlobular emphysema: Secondary | ICD-10-CM

## 2019-02-17 NOTE — Telephone Encounter (Signed)
Dr. Carlis Abbott reviewed. CD sent to canopy to upload. nothing further needed at this time.

## 2019-02-17 NOTE — Addendum Note (Signed)
Addended by: Amado Coe on: 02/17/2019 10:00 AM   Modules accepted: Orders

## 2019-02-23 NOTE — Progress Notes (Deleted)
[place] NOTE  Patient Care Team: Emeterio Reeve, DO as PCP - General (Osteopathic Medicine)  HEME/ONC OVERVIEW: 1. Stage I (pT1NxM0) cholangiocarcinoma of the R liver -Previously followed by [facility] -07/2012: ~3cm mass in the R liver, no mets;   FNA showed poorly differentiated large cell carcinoma  Core bx showed adenocarcinoma, favoring primary intrahepatic cholangiocarcinoma -09/2013: liver resection of segments 4B and 5   Path: adenocarcinoma, 3.7 x 3.2 x 2.9cm, high grade, negative margins, pT1NxMx -On surveillance  CT AP in 07/2017 showed post-surgical changes and hypodensities in the liver, including a stable 1.2cm hypodense area to the R of falciform ligament  CT chest in 02/2018 showed stable emphysema and small lung nodules (largest RUL nodule 75m)  2. BRCA2 mutation (germline vs somatic?)  ASSESSMENT & PLAN:   Stage I (pT1NxM0) cholangiocarcinoma of the R liver -I reviewed the patient's records in detail, including external oncology clinic notes, lab studies, imaging results, and the pathology reports -In summary, patient was found on imaging with a 3 cm mass in the right liver, FNA BX which showed poorly differentiated large cell carcinoma.  Core biopsy confirmed adenocarcinoma, likely primary intrahepatic cholangiocarcinoma.  She underwent local resection in 09/2014, and pathology showed pT1NxMx adenocarcinoma with negative margins.  Since then, she has been undergoing periodic imaging surveillance, most recent in 02/2018, which showed stable lung nodules without any evidence of disease recurrence. -I reviewed imaging and pathology results in detail with the patient, as well as NCCN guideline -For disease surveillance:  Multiphasic CT abdomen/pelvis and CT chest q6-171monthfor up to 5 years   History of BRCA2 mutation -Review of the patient's records showed positive BRCA2 mutation, but unclear if this was from the tumor or  germline -I have referred the patient to high-risk genetic clinic for further evaluation, including counseling for screening for other BRCA-related malignancies   No orders of the defined types were placed in this encounter.   A total of more than {CHL ONC TIME VISIT - [id number]}ere spent face-to-face with the patient during this encounter and over half of that time was spent on counseling and coordination of care as outlined above.    All questions were answered. The patient knows to call the clinic with any problems, questions or concerns.  YaTish MenMD 02/23/2019 1:19 PM   CHIEF COMPLAINTS/PURPOSE OF CONSULTATION:  "I am here for ***"  HISTORY OF PRESENTING ILLNESS:  Erin Ramp89.o. female is here because of ***  REVIEW OF SYSTEMS:   Constitutional: ( - ) fevers, ( - )  chills , ( - ) night sweats Eyes: ( - ) blurriness of vision, ( - ) double vision, ( - ) watery eyes Ears, nose, mouth, throat, and face: ( - ) mucositis, ( - ) sore throat Respiratory: ( - ) cough, ( - ) dyspnea, ( - ) wheezes Cardiovascular: ( - ) palpitation, ( - ) chest discomfort, ( - ) lower extremity swelling Gastrointestinal:  ( - ) nausea, ( - ) heartburn, ( - ) change in bowel habits Skin: ( - ) abnormal skin rashes Lymphatics: ( - ) new lymphadenopathy, ( - ) easy bruising Neurological: ( - ) numbness, ( - ) tingling, ( - ) new weaknesses Behavioral/Psych: ( - ) mood change, ( - ) new changes  All other systems were reviewed with the patient and are negative.  I have reviewed her chart and materials related to her cancer extensively and collaborated history  with the patient. Summary of oncologic history is as follows: Oncology History   No history exists.    MEDICAL HISTORY:  Past Medical History:  Diagnosis Date  . Anxiety   . Arthritis    hands and feet  . ASCUS (atypical squamous cells of undetermined significance) on Pap smear   . Blood transfusion 1983   In [place]  .  BRCA2 gene mutation positive 2014  . Bursitis   . Cancer ([place])    liver  . Chronic constipation   . Condyloma   . COPD (chronic obstructive pulmonary disease) ([place])   . Depression    per pt h/o depression- no meds currently. Dx of bi-polar depression in EPIC  . Headache(784.0)    migraines  . Hot flashes, menopausal   . HPV (human papilloma virus) infection   . Lung nodule, multiple   . Osteopenia   . Seizures ([place])    unknown reason for seizures- last seizurein Sept 2012  . TIA (transient ischemic attack)     SURGICAL HISTORY: Past Surgical History:  Procedure Laterality Date  . ABDOMINOPLASTY    . LAPAROSCOPY  01/04/2011   Procedure: LAPAROSCOPY OPERATIVE;  Surgeon: Juliene Pina C. Hulan Fray, MD;  Location: [facility];  Service: Gynecology;  Laterality: N/A;  . LAPAROSCOPY  06/14   gall bladder and 1/2 liver  . SALPINGOOPHORECTOMY  01/04/2011   Procedure: SALPINGO OOPHERECTOMY;  Surgeon: Myra C. Hulan Fray, MD;  Location: [facility];  Service: Gynecology;  Laterality: Bilateral;  . TOTAL VAGINAL HYSTERECTOMY  58 yrs old    SOCIAL HISTORY: Social History   Socioeconomic History  . Marital status: Divorced    Spouse name: Not on file  . Number of children: 2  . Years of education: Not on file  . Highest education level: Not on file  Occupational History  . Not on file  Social Needs  . Financial resource strain: Not on file  . Food insecurity    Worry: Not on file    Inability: Not on file  . Transportation needs    Medical: Not on file    Non-medical: Not on file  Tobacco Use  . Smoking status: Current Some Day Smoker    Packs/day: 1.00    Years: 40.00    Pack years: 40.00    Types: Cigarettes  . Smokeless tobacco: Never Used  Substance and Sexual Activity  . Alcohol use: No    Comment: One beer two weeks ago.  . Drug use: Not Currently    Types: Marijuana, Cocaine    Comment: history of use  . Sexual activity: Not Currently    Partners: Male  Lifestyle  . Physical activity     Days per week: Not on file    Minutes per session: Not on file  . Stress: Not on file  Relationships  . Social Herbalist on phone: Not on file    Gets together: Not on file    Attends religious service: Not on file    Active member of club or organization: Not on file    Attends meetings of clubs or organizations: Not on file    Relationship status: Not on file  . Intimate partner violence    Fear of current or ex partner: Not on file    Emotionally abused: Not on file    Physically abused: Not on file    Forced sexual activity: Not on file  Other Topics Concern  . Not on file  Social  History Narrative  . Not on file    FAMILY HISTORY: Family History  Problem Relation Age of Onset  . Breast cancer Mother        postmenopausal  . Depression Mother   . Drug abuse Mother   . Drug abuse Father   . Lung cancer Father   . Alcohol abuse Daughter   . Breast cancer Daughter   . Drug abuse Sister   . Breast cancer Maternal Aunt   . Ovarian cancer Neg Hx   . Uterine cancer Neg Hx     ALLERGIES:  is allergic to bee venom; butalbital-apap-caffeine; singulair  [montelukast sodium]; tylenol [acetaminophen]; amoxicillin; and lyrica [pregabalin].  MEDICATIONS:  Current Outpatient Medications  Medication Sig Dispense Refill  . albuterol (VENTOLIN HFA) 108 (90 Base) MCG/ACT inhaler Inhale 2 puffs into the lungs every 6 (six) hours as needed for wheezing or shortness of breath. 6.7 g 11  . Budeson-Glycopyrrol-Formoterol 160-9-4.8 MCG/ACT AERO Inhale 2 puffs into the lungs 2 (two) times daily. 160 g 11  . budesonide-formoterol (SYMBICORT) 160-4.5 MCG/ACT inhaler Inhale 2 puffs into the lungs 2 (two) times daily. 2 Inhaler 11  . diclofenac sodium (VOLTAREN) 1 % GEL Apply 4 g topically 4 (four) times daily. To affected joint. 100 g 11  . Glycopyrrolate-Formoterol (BEVESPI AEROSPHERE) 9-4.8 MCG/ACT AERO Inhale 1 puff into the lungs 2 (two) times daily. (Patient not taking: Reported  on 02/03/2019) 10.7 g 11   No current facility-administered medications for this visit.     PHYSICAL EXAMINATION: ECOG PERFORMANCE STATUS: {CHL ONC ECOG PS:[id number]}  There were no vitals filed for this visit. There were no vitals filed for this visit.  GENERAL: alert, no distress and comfortable SKIN: skin color, texture, turgor are normal, no rashes or significant lesions EYES: conjunctiva are pink and non-injected, sclera clear OROPHARYNX: no exudate, no erythema; lips, buccal mucosa, and tongue normal  NECK: supple, non-tender LYMPH:  no palpable lymphadenopathy in the cervical LUNGS: clear to auscultation with normal breathing effort HEART: regular rate & rhythm, no murmurs, no lower extremity edema ABDOMEN: soft, non-tender, non-distended, normal bowel sounds Musculoskeletal: no cyanosis of digits and no clubbing  PSYCH: alert & oriented x 3, fluent speech NEURO: no focal motor/sensory deficits  LABORATORY DATA:  I have reviewed the data as listed Lab Results  Component Value Date   WBC 7.2 01/02/2011   HGB 12.4 01/02/2011   HCT 37.0 01/02/2011   MCV 95.6 01/02/2011   PLT 220 01/02/2011   Lab Results  Component Value Date   NA 140 12/17/2007   K 3.8 12/17/2007   CL 110 12/17/2007   CO2 19 12/17/2007    RADIOGRAPHIC STUDIES: I have personally reviewed the radiological images as listed and agreed with the findings in the report. No results found.  PATHOLOGY: I have reviewed the pathology reports as documented in the oncologist history.

## 2019-02-24 ENCOUNTER — Ambulatory Visit (INDEPENDENT_AMBULATORY_CARE_PROVIDER_SITE_OTHER): Payer: Medicare Other

## 2019-02-24 ENCOUNTER — Other Ambulatory Visit: Payer: Self-pay

## 2019-02-24 DIAGNOSIS — R918 Other nonspecific abnormal finding of lung field: Secondary | ICD-10-CM | POA: Diagnosis not present

## 2019-02-24 DIAGNOSIS — Z720 Tobacco use: Secondary | ICD-10-CM | POA: Diagnosis not present

## 2019-02-24 DIAGNOSIS — J439 Emphysema, unspecified: Secondary | ICD-10-CM | POA: Diagnosis not present

## 2019-02-26 ENCOUNTER — Other Ambulatory Visit: Payer: Self-pay | Admitting: Hematology

## 2019-02-26 DIAGNOSIS — C221 Intrahepatic bile duct carcinoma: Secondary | ICD-10-CM

## 2019-02-27 ENCOUNTER — Inpatient Hospital Stay: Payer: Medicare Other | Admitting: Hematology

## 2019-02-27 ENCOUNTER — Inpatient Hospital Stay: Payer: Medicare Other | Attending: Hematology

## 2019-02-27 DIAGNOSIS — M549 Dorsalgia, unspecified: Secondary | ICD-10-CM | POA: Insufficient documentation

## 2019-02-27 DIAGNOSIS — C221 Intrahepatic bile duct carcinoma: Secondary | ICD-10-CM | POA: Insufficient documentation

## 2019-02-27 DIAGNOSIS — Z1501 Genetic susceptibility to malignant neoplasm of breast: Secondary | ICD-10-CM | POA: Insufficient documentation

## 2019-02-27 DIAGNOSIS — K52831 Collagenous colitis: Secondary | ICD-10-CM | POA: Insufficient documentation

## 2019-03-04 ENCOUNTER — Encounter: Payer: Self-pay | Admitting: Obstetrics & Gynecology

## 2019-03-04 ENCOUNTER — Ambulatory Visit (INDEPENDENT_AMBULATORY_CARE_PROVIDER_SITE_OTHER): Payer: Medicare Other | Admitting: Obstetrics & Gynecology

## 2019-03-04 ENCOUNTER — Other Ambulatory Visit: Payer: Self-pay

## 2019-03-04 ENCOUNTER — Telehealth: Payer: Self-pay | Admitting: Hematology

## 2019-03-04 VITALS — BP 135/86 | HR 68 | Ht 67.0 in | Wt 138.0 lb

## 2019-03-04 DIAGNOSIS — R768 Other specified abnormal immunological findings in serum: Secondary | ICD-10-CM

## 2019-03-04 DIAGNOSIS — N9089 Other specified noninflammatory disorders of vulva and perineum: Secondary | ICD-10-CM

## 2019-03-04 MED ORDER — VALACYCLOVIR HCL 1 G PO TABS: 1000.0000 mg | ORAL_TABLET | Freq: Every day | ORAL | 6 refills | Status: DC

## 2019-03-04 NOTE — Progress Notes (Signed)
   Subjective:    Patient ID: Erin [name], female    DOB: 02-Oct-1960, 58 y.o.   MRN: [id number]  HPI 58 yo single P2 here today for follow up of her vulvar lesions. She had several of them treated with TCA 1 month ago. Pictures were taken on her phone. They have completely resolved. She has several others on the left labia majora that she would like treated. She would also like a refill of her valtrex.   Review of Systems     Objective:   Physical Exam Breathing, conversing, and ambulating normally Well nourished, well hydrated White female, no apparent distress I treated 4 small (about 67mm) lesions on the left labia majora and 1 on the right     Assessment & Plan:  HSV- refill valtrex for prn use Come back prn

## 2019-03-04 NOTE — Telephone Encounter (Signed)
Called and left detailed message on [phone] for patient regarding appointment that has been rescheduled.  I have also mailed a letter & calendar

## 2019-03-05 ENCOUNTER — Ambulatory Visit: Payer: Medicare Other | Admitting: Neurology

## 2019-03-10 ENCOUNTER — Other Ambulatory Visit: Payer: Self-pay

## 2019-03-10 ENCOUNTER — Encounter: Payer: Self-pay | Admitting: Sports Medicine

## 2019-03-10 ENCOUNTER — Ambulatory Visit (INDEPENDENT_AMBULATORY_CARE_PROVIDER_SITE_OTHER): Payer: Medicare Other | Admitting: Sports Medicine

## 2019-03-10 DIAGNOSIS — M7061 Trochanteric bursitis, right hip: Secondary | ICD-10-CM | POA: Diagnosis not present

## 2019-03-10 NOTE — Assessment & Plan Note (Signed)
Complete improvement with injection. She was unable to do PT because it in [facility], she desires PT to be in [place], we will do a referral to one of the outside [place] offices. Because she continues to have pain in spite of greater than 6 weeks of conservative measures, including injection as well as a sensation of fullness over the lateral hip we are going to proceed with hip MRI without contrast. Return to see me in 4 weeks.

## 2019-03-10 NOTE — Progress Notes (Signed)
Subjective:    CC: Follow-up  HPI: Erin [name] returns, we diagnosed her with trochanteric bursitis the last visit, she had a fantastic response, to the right trochanteric bursa injection, she still has a bit of pain and has not done any physical therapy.  I reviewed the past medical history, family history, social history, surgical history, and allergies today and no changes were needed.  Please see the problem list section below in epic for further details.  Past Medical History: Past Medical History:  Diagnosis Date  . Anxiety   . Arthritis    hands and feet  . ASCUS (atypical squamous cells of undetermined significance) on Pap smear   . Blood transfusion 1983   In [place]  . BRCA2 gene mutation positive 2014  . Bursitis   . Cancer ([place])    liver  . Chronic constipation   . Condyloma   . COPD (chronic obstructive pulmonary disease) ([place])   . Depression    per pt h/o depression- no meds currently. Dx of bi-polar depression in EPIC  . Headache(784.0)    migraines  . Hot flashes, menopausal   . HPV (human papilloma virus) infection   . Lung nodule, multiple   . Osteopenia   . Seizures ([place])    unknown reason for seizures- last seizurein Sept 2012  . TIA (transient ischemic attack)    Past Surgical History: Past Surgical History:  Procedure Laterality Date  . ABDOMINOPLASTY    . LAPAROSCOPY  01/04/2011   Procedure: LAPAROSCOPY OPERATIVE;  Surgeon: Juliene Pina C. Hulan Fray, MD;  Location: [facility];  Service: Gynecology;  Laterality: N/A;  . LAPAROSCOPY  06/14   gall bladder and 1/2 liver  . SALPINGOOPHORECTOMY  01/04/2011   Procedure: SALPINGO OOPHERECTOMY;  Surgeon: Myra C. Hulan Fray, MD;  Location: [facility];  Service: Gynecology;  Laterality: Bilateral;  . TOTAL VAGINAL HYSTERECTOMY  58 yrs old   Social History: Social History   Socioeconomic History  . Marital status: Divorced    Spouse name: Not on file  . Number of children: 2  . Years of education: Not on file  . Highest education  level: Not on file  Occupational History  . Not on file  Social Needs  . Financial resource strain: Not on file  . Food insecurity    Worry: Not on file    Inability: Not on file  . Transportation needs    Medical: Not on file    Non-medical: Not on file  Tobacco Use  . Smoking status: Current Some Day Smoker    Packs/day: 1.00    Years: 40.00    Pack years: 40.00    Types: Cigarettes  . Smokeless tobacco: Never Used  Substance and Sexual Activity  . Alcohol use: No    Comment: One beer two weeks ago.  . Drug use: Not Currently    Types: Marijuana, Cocaine    Comment: history of use  . Sexual activity: Not Currently    Partners: Male  Lifestyle  . Physical activity    Days per week: Not on file    Minutes per session: Not on file  . Stress: Not on file  Relationships  . Social Herbalist on phone: Not on file    Gets together: Not on file    Attends religious service: Not on file    Active member of club or organization: Not on file    Attends meetings of clubs or organizations: Not on file    Relationship  status: Not on file  Other Topics Concern  . Not on file  Social History Narrative  . Not on file   Family History: Family History  Problem Relation Age of Onset  . Breast cancer Mother        postmenopausal  . Depression Mother   . Drug abuse Mother   . Drug abuse Father   . Lung cancer Father   . Alcohol abuse Daughter   . Breast cancer Daughter   . Drug abuse Sister   . Breast cancer Maternal Aunt   . Ovarian cancer Neg Hx   . Uterine cancer Neg Hx    Allergies: Allergies  Allergen Reactions  . Bee Venom Anaphylaxis  . Butalbital-Apap-Caffeine Other (See Comments)    Abd. pain  . Singulair  [Montelukast Sodium] Other (See Comments)    "Sinus infection for months"  . Tylenol [Acetaminophen] Other (See Comments)    Can not take due to patient's liver cancer and resection.  Marland Kitchen Amoxicillin Diarrhea    States liver is compromised so she  can not take.   Recardo Evangelist [Pregabalin] Itching   Medications: See med rec.  Review of Systems: No fevers, chills, night sweats, weight loss, chest pain, or shortness of breath.   Objective:    General: Well Developed, well nourished, and in no acute distress.  Neuro: Alert and oriented x3, extra-ocular muscles intact, sensation grossly intact.  HEENT: Normocephalic, atraumatic, pupils equal round reactive to light, neck supple, no masses, no lymphadenopathy, thyroid nonpalpable.  Skin: Warm and dry, no rashes. Cardiac: Regular rate and rhythm, no murmurs rubs or gallops, no lower extremity edema.  Respiratory: Clear to auscultation bilaterally. Not using accessory muscles, speaking in full sentences. Right hip: ROM IR: 60 Deg, ER: 60 Deg, Flexion: 120 Deg, Extension: 100 Deg, Abduction: 45 Deg, Adduction: 45 Deg Strength IR: 5/5, ER: 5/5, Flexion: 5/5, Extension: 5/5, Abduction: 5/5, Adduction: 5/5 Pelvic alignment unremarkable to inspection and palpation. Standing hip rotation and gait without trendelenburg / unsteadiness. Mild fullness and tenderness over the greater trochanter on the right. No tenderness over piriformis. No SI joint tenderness and normal minimal SI movement.  Impression and Recommendations:    Greater trochanteric bursitis, right Complete improvement with injection. She was unable to do PT because it in [facility], she desires PT to be in [place], we will do a referral to one of the outside [place] offices. Because she continues to have pain in spite of greater than 6 weeks of conservative measures, including injection as well as a sensation of fullness over the lateral hip we are going to proceed with hip MRI without contrast. Return to see me in 4 weeks.   ___________________________________________ Gwen Her. Dianah Field, M.D., ABFM., CAQSM. Primary Care and Sports Medicine [facility]  Adjunct Professor of [place] [employer]

## 2019-03-15 NOTE — Progress Notes (Signed)
[place] NOTE  Patient Care Team: Emeterio Reeve, DO as PCP - General (Osteopathic Medicine)  HEME/ONC OVERVIEW: 1. Stage I (pT1NxM0) cholangiocarcinoma of the R liver -Previously followed by [facility] -07/2012: ~3cm mass in the R liver, no mets;   FNA showed poorly differentiated large cell carcinoma  Core bx showed adenocarcinoma, favoring primary intrahepatic cholangiocarcinoma -09/2013: liver resection of segments 4B and 5   Path: adenocarcinoma, 3.7 x 3.2 x 2.9cm, high grade, negative margins, pT1NxMx -On surveillance  CT AP in 07/2017 showed post-surgical changes and hypodensities in the liver, including a stable 1.2cm hypodense area to the R of falciform ligament  CT chest in 02/2018 showed stable emphysema and small lung nodules (largest RUL nodule 81m)  2. BRCA2 mutation (germline?)  ASSESSMENT & PLAN:   Stage I (pT1NxM0) cholangiocarcinoma of the R liver -I reviewed the patient's records in detail, including external oncology clinic notes, lab studies, imaging results, and the pathology reports -In summary, patient was found on imaging with a 3 cm mass in the right liver, FNA BX which showed poorly differentiated large cell carcinoma.  Core biopsy confirmed adenocarcinoma, likely primary intrahepatic cholangiocarcinoma.  She underwent local resection in 09/2014, and pathology showed pT1NxMx adenocarcinoma with negative margins.  Since then, she has been undergoing periodic imaging surveillance, most recent in 02/2018, which showed stable lung nodules without any evidence of disease recurrence. -I reviewed imaging and pathology results in detail with the patient, as well as NCCN guideline -As the patient has had any scans since late 2019, I have ordered surveillance CT CAP w/ contrast.  If no evidence of disease recurrence, then she may only need one more scan in 1 year and surveillance scans may be discontinued if NED. -For disease  surveillance:  Multiphasic CT abdomen/pelvis and CT chest q6-155monthfor up to 5 years   History of BRCA2 mutation -Review of the patient's records showed positive BRCA2 mutation, but unclear if this was from the tumor or germline -I have referred the patient to genetic clinic for further evaluation, including counseling for screening for other BRCA-related malignancies  Recurrent back pain -Patient has had chronic back pain, for which she had been referred to pain management [place] [facility] [place] the recurrent [place] pain, I recommended the patient to contact her PCP for further management   History of collagenous colitis -Noted incidentally on colonoscopy in 2020 -[facility] for further management   Age-appropriate cancer screening -Patient reports hx of bilateral oophorectomy -While awaiting genetic referral, I emphasized the importance of cancer screening, including MMG -Patient expressed understanding, and will follow up with her PCP for age-appropriate cancer screening   Orders Placed This Encounter  Procedures  . CT chest w/ contrast    Standing Status:   Future    Standing Expiration Date:   03/15/2020    Order Specific Question:   If indicated for the ordered procedure, I authorize the administration of contrast media per Radiology protocol    Answer:   Yes    Order Specific Question:   Preferred imaging location?    [facility]   [facility]:   Radiology Contrast Protocol - do NOT remove file path    Answer:   \\[email or web address]    Order Specific Question:   Is patient pregnant?  Answer:   No  . CT AP w/ contrast    Standing Status:   Future    Standing Expiration Date:   03/15/2020    Order Specific Question:   If indicated for the ordered procedure, I authorize the  administration of contrast media per Radiology protocol    Answer:   Yes    Order Specific Question:   Preferred imaging location?    [facility] Specific Question:   Is Oral Contrast requested for this exam?    Answer:   Yes, Per Radiology protocol    Order Specific Question:   Radiology Contrast Protocol - do NOT remove file path    Answer:   \\[email or web address]    Order Specific Question:   Is patient pregnant?    Answer:   No  . CBC w/ diff    Standing Status:   Future    Standing Expiration Date:   04/19/2020  . CMP    Standing Status:   Future    Standing Expiration Date:   04/19/2020  . CA 19.9    Standing Status:   Future    Standing Expiration Date:   03/15/2020  . Ambulatory referral to Gastroenterology    Referral Priority:   Routine    Referral Type:   Consultation    Referral Reason:   Specialty Services Required    Referred to Provider:   Lavena Bullion, DO    Number of Visits Requested:   1  . Ambulatory referral to Genetics    Referral Priority:   Routine    Referral Type:   Consultation    Referral Reason:   Specialty Services Required    Number of Visits Requested:   1   All questions were answered. The patient knows to call the clinic with any problems, questions or concerns.  Return in 1 year for labs, CT scans and clinic appt.   Tish Men, MD 03/16/2019 10:33 AM   CHIEF COMPLAINTS/PURPOSE OF CONSULTATION:  "My back is bothering me"  HISTORY OF PRESENTING ILLNESS:  Erin [name] 58 y.o. female is here because of history of Stage I cholangiocarcinoma.  The patient reports that she has had chronic back pain for some time, starting this morning, she noticed new onset lower back pain, dull, constant, and radiating to the bilateral flank.   She has intermittent mild night sweats, but denies any fever, chill, or weight loss.  Her weight has been stable.  She denies any other complaint today.   REVIEW OF  SYSTEMS:   Constitutional: ( - ) fevers, ( - )  chills , ( - ) night sweats Eyes: ( - ) blurriness of vision, ( - ) double vision, ( - ) watery eyes Ears, nose, mouth, throat, and face: ( - ) mucositis, ( - ) sore throat Respiratory: ( - ) cough, ( - ) dyspnea, ( - ) wheezes Cardiovascular: ( - ) palpitation, ( - ) chest discomfort, ( - ) lower extremity swelling Gastrointestinal:  ( - ) nausea, ( - ) heartburn, ( - ) change in bowel habits Skin: ( - ) abnormal skin rashes Lymphatics: ( - ) new lymphadenopathy, ( - ) easy bruising Neurological: ( - ) numbness, ( - ) tingling, ( - ) new weaknesses Behavioral/Psych: ( - ) mood change, ( - ) new changes  All other systems were reviewed with the patient and are negative.  I have reviewed her chart  and materials related to her cancer extensively and collaborated history with the patient. Summary of oncologic history is as follows: Oncology History   No history exists.    MEDICAL HISTORY:  Past Medical History:  Diagnosis Date  . Anxiety   . Arthritis    hands and feet  . ASCUS (atypical squamous cells of undetermined significance) on Pap smear   . Blood transfusion 1983   In [place]  . BRCA2 gene mutation positive 2014  . Bursitis   . Cancer ([place])    liver  . Chronic constipation   . Condyloma   . COPD (chronic obstructive pulmonary disease) ([place])   . Depression    per pt h/o depression- no meds currently. Dx of bi-polar depression in EPIC  . Headache(784.0)    migraines  . Hot flashes, menopausal   . HPV (human papilloma virus) infection   . Lung nodule, multiple   . Osteopenia   . Seizures ([place])    unknown reason for seizures- last seizurein Sept 2012  . TIA (transient ischemic attack)     SURGICAL HISTORY: Past Surgical History:  Procedure Laterality Date  . ABDOMINOPLASTY    . LAPAROSCOPY  01/04/2011   Procedure: LAPAROSCOPY OPERATIVE;  Surgeon: Juliene Pina C. Hulan Fray, MD;  Location: [facility];  Service: Gynecology;  Laterality:  N/A;  . LAPAROSCOPY  06/14   gall bladder and 1/2 liver  . SALPINGOOPHORECTOMY  01/04/2011   Procedure: SALPINGO OOPHERECTOMY;  Surgeon: Myra C. Hulan Fray, MD;  Location: [facility];  Service: Gynecology;  Laterality: Bilateral;  . TOTAL VAGINAL HYSTERECTOMY  58 yrs old    SOCIAL HISTORY: Social History   Socioeconomic History  . Marital status: Divorced    Spouse name: Not on file  . Number of children: 2  . Years of education: Not on file  . Highest education level: Not on file  Occupational History  . Not on file  Social Needs  . Financial resource strain: Not on file  . Food insecurity    Worry: Not on file    Inability: Not on file  . Transportation needs    Medical: Not on file    Non-medical: Not on file  Tobacco Use  . Smoking status: Current Some Day Smoker    Packs/day: 1.00    Years: 40.00    Pack years: 40.00    Types: Cigarettes  . Smokeless tobacco: Never Used  Substance and Sexual Activity  . Alcohol use: No    Comment: One beer two weeks ago.  . Drug use: Not Currently    Types: Marijuana, Cocaine    Comment: history of use  . Sexual activity: Not Currently    Partners: Male  Lifestyle  . Physical activity    Days per week: Not on file    Minutes per session: Not on file  . Stress: Not on file  Relationships  . Social Herbalist on phone: Not on file    Gets together: Not on file    Attends religious service: Not on file    Active member of club or organization: Not on file    Attends meetings of clubs or organizations: Not on file    Relationship status: Not on file  . Intimate partner violence    Fear of current or ex partner: Not on file    Emotionally abused: Not on file    Physically abused: Not on file    Forced sexual activity: Not on file  Other  Topics Concern  . Not on file  Social History Narrative  . Not on file    FAMILY HISTORY: Family History  Problem Relation Age of Onset  . Breast cancer Mother        postmenopausal   . Depression Mother   . Drug abuse Mother   . Drug abuse Father   . Lung cancer Father   . Alcohol abuse Daughter   . Breast cancer Daughter   . Drug abuse Sister   . Breast cancer Maternal Aunt   . Ovarian cancer Neg Hx   . Uterine cancer Neg Hx     ALLERGIES:  is allergic to bee venom; butalbital-apap-caffeine; singulair  [montelukast sodium]; tylenol [acetaminophen]; amoxicillin; and lyrica [pregabalin].  MEDICATIONS:  Current Outpatient Medications  Medication Sig Dispense Refill  . albuterol (VENTOLIN HFA) 108 (90 Base) MCG/ACT inhaler Inhale 2 puffs into the lungs every 6 (six) hours as needed for wheezing or shortness of breath. 6.7 g 11  . Budeson-Glycopyrrol-Formoterol 160-9-4.8 MCG/ACT AERO Inhale 2 puffs into the lungs 2 (two) times daily. (Patient not taking: Reported on 03/04/2019) 160 g 11  . budesonide-formoterol (SYMBICORT) 160-4.5 MCG/ACT inhaler Inhale 2 puffs into the lungs 2 (two) times daily. 2 Inhaler 11  . diclofenac sodium (VOLTAREN) 1 % GEL Apply 4 g topically 4 (four) times daily. To affected joint. (Patient not taking: Reported on 03/04/2019) 100 g 11  . Glycopyrrolate-Formoterol (BEVESPI AEROSPHERE) 9-4.8 MCG/ACT AERO Inhale 1 puff into the lungs 2 (two) times daily. (Patient not taking: Reported on 02/03/2019) 10.7 g 11  . valACYclovir (VALTREX) 1000 MG tablet Take 1 tablet (1,000 mg total) by mouth daily. 5 tablet 6   No current facility-administered medications for this visit.     PHYSICAL EXAMINATION: ECOG PERFORMANCE STATUS: 2 - Symptomatic, <50% confined to bed  Vitals:   03/16/19 0856  BP: 133/84  Pulse: 77  Resp: 18  Temp: (!) 97.1 F (36.2 C)  SpO2: 96%   Filed Weights   03/16/19 0856  Weight: 138 lb (62.6 kg)    GENERAL: alert, no distress, mildly uncomfortable due to back pain  SKIN: skin color, texture, turgor are normal, no rashes or significant lesions EYES: conjunctiva are pink and non-injected, sclera clear OROPHARYNX: no  exudate, no erythema; lips, buccal mucosa, and tongue normal  NECK: supple, non-tender LYMPH:  no palpable lymphadenopathy in the cervical LUNGS: clear to auscultation with normal breathing effort HEART: regular rate & rhythm, no murmurs, no lower extremity edema ABDOMEN: soft, non-tender, non-distended, normal bowel sounds Musculoskeletal: no cyanosis of digits and no clubbing  PSYCH: alert & oriented x 3, fluent speech  LABORATORY DATA:  I have reviewed the data as listed Lab Results  Component Value Date   WBC 6.3 03/16/2019   HGB 14.0 03/16/2019   HCT 41.2 03/16/2019   MCV 94.7 03/16/2019   PLT 237 03/16/2019   Lab Results  Component Value Date   NA 140 03/16/2019   K 3.6 03/16/2019   CL 103 03/16/2019   CO2 28 03/16/2019    RADIOGRAPHIC STUDIES: I have personally reviewed the radiological images as listed and agreed with the findings in the report. Ct Chest Wo Contrast  Result Date: 02/24/2019 CLINICAL DATA:  Pulmonary nodules.  Chronic dyspnea. EXAM: CT CHEST WITHOUT CONTRAST TECHNIQUE: Multidetector CT imaging of the chest was performed following the standard protocol without IV contrast. COMPARISON:  None. FINDINGS: Cardiovascular: 4 cm ascending thoracic aortic aneurysm is noted. Atherosclerosis of thoracic aorta is noted.  Normal cardiac size. No pericardial effusion. Mediastinum/Nodes: No enlarged mediastinal or axillary lymph nodes. Thyroid gland, trachea, and esophagus demonstrate no significant findings. Lungs/Pleura: No pneumothorax or pleural effusion is noted. No acute consolidative process is noted. 2 mm nodule is noted in left lower lobe best seen on image number 111 of series 3. 3 mm nodule is noted in left upper lobe best seen on image number 72 of series 3. Probable emphysematous disease is noted in the upper lobes. Hyperexpansion of the lungs is noted. Upper Abdomen: No acute abnormality. Musculoskeletal: No chest wall mass or suspicious bone lesions identified.  IMPRESSION: 4 cm ascending thoracic aortic aneurysm. Recommend annual imaging followup by CTA or MRA. This recommendation follows 2010 ACCF/AHA/AATS/ACR/ASA/SCA/SCAI/SIR/STS/SVM Guidelines for the Diagnosis and Management of Patients with Thoracic Aortic Disease. Circulation. 2010; 121: F643-P295. Aortic aneurysm NOS (ICD10-I71.9). Two left-sided pulmonary nodules are noted, the largest measuring 3 mm. No follow-up needed if patient is low-risk (and has no known or suspected primary neoplasm). Non-contrast chest CT can be considered in 12 months if patient is high-risk. This recommendation follows the consensus statement: Guidelines for Management of Incidental Pulmonary Nodules Detected on CT Images: From the Fleischner Society 2017; Radiology 2017; 284:228-243. Aortic Atherosclerosis (ICD10-I70.0) and Emphysema (ICD10-J43.9). Electronically Signed   By: Marijo Conception M.D.   On: 02/24/2019 08:54    PATHOLOGY: I have reviewed the pathology reports as documented in the oncologist history.

## 2019-03-16 ENCOUNTER — Inpatient Hospital Stay (HOSPITAL_BASED_OUTPATIENT_CLINIC_OR_DEPARTMENT_OTHER): Payer: Medicare Other | Admitting: Hematology

## 2019-03-16 ENCOUNTER — Other Ambulatory Visit: Payer: Self-pay

## 2019-03-16 ENCOUNTER — Encounter: Payer: Self-pay | Admitting: Hematology

## 2019-03-16 ENCOUNTER — Inpatient Hospital Stay: Payer: Medicare Other

## 2019-03-16 ENCOUNTER — Telehealth: Payer: Self-pay | Admitting: Hematology

## 2019-03-16 ENCOUNTER — Ambulatory Visit (INDEPENDENT_AMBULATORY_CARE_PROVIDER_SITE_OTHER): Payer: Medicare Other

## 2019-03-16 VITALS — BP 133/84 | HR 77 | Temp 97.1°F | Resp 18 | Wt 138.0 lb

## 2019-03-16 DIAGNOSIS — M549 Dorsalgia, unspecified: Secondary | ICD-10-CM | POA: Diagnosis not present

## 2019-03-16 DIAGNOSIS — M7061 Trochanteric bursitis, right hip: Secondary | ICD-10-CM | POA: Diagnosis not present

## 2019-03-16 DIAGNOSIS — K52831 Collagenous colitis: Secondary | ICD-10-CM

## 2019-03-16 DIAGNOSIS — M1611 Unilateral primary osteoarthritis, right hip: Secondary | ICD-10-CM | POA: Diagnosis not present

## 2019-03-16 DIAGNOSIS — Z1501 Genetic susceptibility to malignant neoplasm of breast: Secondary | ICD-10-CM

## 2019-03-16 DIAGNOSIS — C221 Intrahepatic bile duct carcinoma: Secondary | ICD-10-CM | POA: Diagnosis not present

## 2019-03-16 DIAGNOSIS — M25551 Pain in right hip: Secondary | ICD-10-CM | POA: Diagnosis not present

## 2019-03-16 LAB — CMP (CANCER CENTER ONLY)
ALT: 8 U/L (ref 0–44)
AST: 16 U/L (ref 15–41)
Albumin: 5.2 g/dL — ABNORMAL HIGH (ref 3.5–5.0)
Alkaline Phosphatase: 79 U/L (ref 38–126)
Anion gap: 9 (ref 5–15)
BUN: 16 mg/dL (ref 6–20)
CO2: 28 mmol/L (ref 22–32)
Calcium: 9.9 mg/dL (ref 8.9–10.3)
Chloride: 103 mmol/L (ref 98–111)
Creatinine: 0.85 mg/dL (ref 0.44–1.00)
GFR, Est AFR Am: >60 mL/min (ref >60)
GFR, Estimated: >60 mL/min (ref >60)
Glucose, Bld: 102 mg/dL — ABNORMAL HIGH (ref 70–99)
Potassium: 3.6 mmol/L (ref 3.5–5.1)
Sodium: 140 mmol/L (ref 135–145)
Total Bilirubin: 0.7 mg/dL (ref 0.3–1.2)
Total Protein: 7.8 g/dL (ref 6.5–8.1)

## 2019-03-16 LAB — CBC WITH DIFFERENTIAL (CANCER CENTER ONLY)
Abs Immature Granulocytes: 0.01 10*3/uL (ref 0.00–0.07)
Basophils Absolute: 0 10*3/uL (ref 0.0–0.1)
Basophils Relative: 1 %
Eosinophils Absolute: 0 10*3/uL (ref 0.0–0.5)
Eosinophils Relative: 1 %
HCT: 41.2 % (ref 36.0–46.0)
Hemoglobin: 14 g/dL (ref 12.0–15.0)
Immature Granulocytes: 0 %
Lymphocytes Relative: 30 %
Lymphs Abs: 1.9 10*3/uL (ref 0.7–4.0)
MCH: 32.2 pg (ref 26.0–34.0)
MCHC: 34 g/dL (ref 30.0–36.0)
MCV: 94.7 fL (ref 80.0–100.0)
Monocytes Absolute: 0.4 10*3/uL (ref 0.1–1.0)
Monocytes Relative: 6 %
Neutro Abs: 4 10*3/uL (ref 1.7–7.7)
Neutrophils Relative %: 62 %
Platelet Count: 237 10*3/uL (ref 150–400)
RBC: 4.35 MIL/uL (ref 3.87–5.11)
RDW: 11.9 % (ref 11.5–15.5)
WBC Count: 6.3 10*3/uL (ref 4.0–10.5)
nRBC: 0 % (ref 0.0–0.2)

## 2019-03-16 NOTE — Telephone Encounter (Signed)
Per 11/30 los appointments scheduled/ calendar printed and patient will get contrast prior to CT Scan that will be scheduled prior to next visit

## 2019-03-17 LAB — CANCER ANTIGEN 19-9: CA 19-9: 8 U/mL (ref 0–35)

## 2019-03-18 ENCOUNTER — Telehealth: Payer: Self-pay

## 2019-03-18 DIAGNOSIS — M6281 Muscle weakness (generalized): Secondary | ICD-10-CM | POA: Diagnosis not present

## 2019-03-18 DIAGNOSIS — M25551 Pain in right hip: Secondary | ICD-10-CM | POA: Diagnosis not present

## 2019-03-18 NOTE — Telephone Encounter (Signed)
To ordering physician

## 2019-03-18 NOTE — Telephone Encounter (Signed)
Looks like Museum/gallery conservator left a message on the patient's voicemail on the 30th of last month.

## 2019-03-18 NOTE — Telephone Encounter (Signed)
Pt called requesting MRI results. Pls advise, thanks.

## 2019-04-13 ENCOUNTER — Ambulatory Visit: Payer: Medicare Other | Admitting: Neurology

## 2019-05-13 ENCOUNTER — Encounter: Payer: Self-pay | Admitting: Critical Care Medicine

## 2019-05-13 ENCOUNTER — Ambulatory Visit (INDEPENDENT_AMBULATORY_CARE_PROVIDER_SITE_OTHER): Payer: Medicare Other | Admitting: Critical Care Medicine

## 2019-05-13 ENCOUNTER — Other Ambulatory Visit: Payer: Self-pay

## 2019-05-13 VITALS — BP 120/72 | HR 66 | Temp 97.7°F | Ht 67.0 in | Wt 140.6 lb

## 2019-05-13 DIAGNOSIS — J4489 Other specified chronic obstructive pulmonary disease: Secondary | ICD-10-CM

## 2019-05-13 DIAGNOSIS — Z720 Tobacco use: Secondary | ICD-10-CM | POA: Diagnosis not present

## 2019-05-13 DIAGNOSIS — R918 Other nonspecific abnormal finding of lung field: Secondary | ICD-10-CM | POA: Diagnosis not present

## 2019-05-13 DIAGNOSIS — J449 Chronic obstructive pulmonary disease, unspecified: Secondary | ICD-10-CM

## 2019-05-13 NOTE — Patient Instructions (Addendum)
Thank you for visiting Dr. Carlis Abbott at [facility]. We recommend the following: Orders Placed This Encounter  Procedures  . CT Chest Wo Contrast   Orders Placed This Encounter  Procedures  . CT Chest Wo Contrast    November 2021    Standing Status:   Future    Standing Expiration Date:   07/10/2020    Order Specific Question:   ** REASON FOR EXAM (FREE TEXT)    Answer:   follow up nodules; high risk- ongoing tobacco, h/o colangiocarcinoma    Order Specific Question:   Is patient pregnant?    Answer:   Unknown (Please Explain)    Order Specific Question:   Preferred imaging location?    Answer:   [facility]    Order Specific Question:   Radiology Contrast Protocol - do NOT remove file path    Answer:   \\[email or web address]    No orders of the defined types were placed in this encounter.   Return in about 6 months (around 11/10/2019).    Please do your part to reduce the spread of COVID-19.

## 2019-05-13 NOTE — Progress Notes (Signed)
Synopsis: Referred in October 2020 for COPD by Emeterio Reeve, DO.   Subjective:   PATIENT ID: Erin [name] GENDER: female DOB: 07-May-1960, MRN: [id number]  Chief Complaint  Patient presents with  . Follow-up    Patient is here for follow up shortness of breath all the time. Patient has occasional cough mostly dry.  Patient was started on Breztri last visit.    Erin [name] is a 59 year old woman who presents for follow-up of COPD.  Since her last visit she has been using for history, but has had increasing shortness of breath.  She attributes this to smoking more.  She was previously been down about 5 cigarettes a day, but has increased about 15 cigarettes a day due to being bored, stressed, and cold weather.  She has been intolerant to Chantix in the past and has tried nicotine replacement therapy.  She is albuterol 3 times a day, with her term improvement in her dyspnea on exertion.  She can walk about 2 blocks without stopping, but can no longer speed walk as she could before.  She has occasional cough with clear sputum, but denies wheezing.  She has no previous exacerbations requiring prednisone or antibiotics.  She has been gaining weight recently.  She has episodes of nausea about twice a day the vomiting for several months, unchanged.  When she had cholangiocarcinoma in the past she did not have nausea, just weight loss.  She was started on an antacid by her PCP, which she has not been taking due to lack of heartburn symptoms.  It did not improve her nausea.  He is up-to-date on flu and pneumonia shots.     OV 02/03/20: Erin [name] is a 59 year old woman with a history of COPD diagnosed by spirometry in 2018.  She is previously a patient of Dr. Michela Pitcher at [facility].  At her last pulmonary visit in July she was on Bevespi but had uncontrolled dyspnea attributed to increased smoking and deconditioning.  At that time they were considering escalating therapy to triple inhaled  therapy.  She is a current smoker, 40 years x 0.5 ppd.  She has a history of cholangiocarcinoma with liver involvement, status post cholecystectomy and partial liver resection.  She is undergoing surveillance with her oncologist Dr. Bridgett Larsson at [facility].  She has a family history notable for lung cancer in her father.  Her current inhaler regimen includes Symbicort twice daily and Bevespi once daily, and the Symbicort was added recently.  She has been out of albuterol, but feels as though she needs it, especially when she is exercising.  Her baseline symptoms include wheezing on most days, dyspnea when she is walking, and recently due to weather changes and allergies she has had postnasal drip and coughing.  She does not have sputum production.  She regularly walks on her treadmill, about 4 miles per day.  She is not interested in taking allergy medications.  She has never been hospitalized for exacerbations and has never required prednisone as an outpatient.  She continues to smoke about 7 cigarettes a day, down from her 1 pack/day at the heaviest.  She has been smoking since she was a child, about 45 years.  In the past to try and quit she has attempted Chantix, which gave her bad dreams, nicotine replacement patches and gum (the gum in her mouth swell), hypnosis, and magnet therapy.  In the past she has always started back to smoking due to "willpower".  She  feels that her anxiety is too uncontrolled to try and quit again right now.  She has been staying at home without many visitors and has been trying to avoid exposures to COVID-19.  She has not yet had her flu vaccine this year.    Past Medical History:  Diagnosis Date  . Anxiety   . Arthritis    hands and feet  . ASCUS (atypical squamous cells of undetermined significance) on Pap smear   . Blood transfusion 1983   In [place]  . BRCA2 gene mutation positive 2014  . Bursitis   . Cancer ([place])    liver  . Chronic  constipation   . Condyloma   . COPD (chronic obstructive pulmonary disease) ([place])   . Depression    per pt h/o depression- no meds currently. Dx of bi-polar depression in EPIC  . Headache(784.0)    migraines  . Hot flashes, menopausal   . HPV (human papilloma virus) infection   . Lung nodule, multiple   . Osteopenia   . Seizures ([place])    unknown reason for seizures- last seizurein Sept 2012  . TIA (transient ischemic attack)      Family History  Problem Relation Age of Onset  . Breast cancer Mother        postmenopausal  . Depression Mother   . Drug abuse Mother   . Drug abuse Father   . Lung cancer Father   . Alcohol abuse Daughter   . Breast cancer Daughter   . Drug abuse Sister   . Breast cancer Maternal Aunt   . Ovarian cancer Neg Hx   . Uterine cancer Neg Hx      Past Surgical History:  Procedure Laterality Date  . ABDOMINOPLASTY    . LAPAROSCOPY  01/04/2011   Procedure: LAPAROSCOPY OPERATIVE;  Surgeon: Juliene Pina C. Hulan Fray, MD;  Location: [facility];  Service: Gynecology;  Laterality: N/A;  . LAPAROSCOPY  06/14   gall bladder and 1/2 liver  . SALPINGOOPHORECTOMY  01/04/2011   Procedure: SALPINGO OOPHERECTOMY;  Surgeon: Myra C. Hulan Fray, MD;  Location: [facility];  Service: Gynecology;  Laterality: Bilateral;  . TOTAL VAGINAL HYSTERECTOMY  59 yrs old    Social History   Socioeconomic History  . Marital status: Divorced    Spouse name: Not on file  . Number of children: 2  . Years of education: Not on file  . Highest education level: Not on file  Occupational History  . Not on file  Tobacco Use  . Smoking status: Current Some Day Smoker    Packs/day: 1.00    Years: 40.00    Pack years: 40.00    Types: Cigarettes  . Smokeless tobacco: Never Used  Substance and Sexual Activity  . Alcohol use: No    Comment: One beer two weeks ago.  . Drug use: Not Currently    Types: Marijuana, Cocaine    Comment: history of use  . Sexual activity: Not Currently    Partners: Male   Other Topics Concern  . Not on file  Social History Narrative  . Not on file   Social Determinants of Health   Financial Resource Strain:   . Difficulty of Paying Living Expenses: Not on file  Food Insecurity:   . Worried About Charity fundraiser in the Last Year: Not on file  . Ran Out of Food in the Last Year: Not on file  Transportation Needs:   . Lack of Transportation (Medical): Not on file  .  Lack of Transportation (Non-Medical): Not on file  Physical Activity:   . Days of Exercise per Week: Not on file  . Minutes of Exercise per Session: Not on file  Stress:   . Feeling of Stress : Not on file  Social Connections:   . Frequency of Communication with Friends and Family: Not on file  . Frequency of Social Gatherings with Friends and Family: Not on file  . Attends Religious Services: Not on file  . Active Member of Clubs or Organizations: Not on file  . Attends Archivist Meetings: Not on file  . Marital Status: Not on file  Intimate Partner Violence:   . Fear of Current or Ex-Partner: Not on file  . Emotionally Abused: Not on file  . Physically Abused: Not on file  . Sexually Abused: Not on file     Allergies  Allergen Reactions  . Bee Venom Anaphylaxis  . Butalbital-Apap-Caffeine Other (See Comments)    Abd. pain  . Singulair  [Montelukast Sodium] Other (See Comments)    "Sinus infection for months"  . Tylenol [Acetaminophen] Other (See Comments)    Can not take due to patient's liver cancer and resection.  Marland Kitchen Amoxicillin Diarrhea    States liver is compromised so she can not take.   Recardo Evangelist [Pregabalin] Itching     Immunization History  Administered Date(s) Administered  . Influenza,inj,Quad PF,6+ Mos 02/03/2019  . Pneumococcal Polysaccharide-23 02/03/2015  . Tdap 05/08/2010  . Zoster 01/04/2014    Outpatient Medications Prior to Visit  Medication Sig Dispense Refill  . albuterol (VENTOLIN HFA) 108 (90 Base) MCG/ACT inhaler Inhale 2 puffs  into the lungs every 6 (six) hours as needed for wheezing or shortness of breath. 6.7 g 11  . Budeson-Glycopyrrol-Formoterol 160-9-4.8 MCG/ACT AERO Inhale 2 puffs into the lungs 2 (two) times daily. 160 g 11  . diclofenac sodium (VOLTAREN) 1 % GEL Apply 4 g topically 4 (four) times daily. To affected joint. 100 g 11  . valACYclovir (VALTREX) 1000 MG tablet Take 1 tablet (1,000 mg total) by mouth daily. 5 tablet 6  . budesonide-formoterol (SYMBICORT) 160-4.5 MCG/ACT inhaler Inhale 2 puffs into the lungs 2 (two) times daily. 2 Inhaler 11  . Glycopyrrolate-Formoterol (BEVESPI AEROSPHERE) 9-4.8 MCG/ACT AERO Inhale 1 puff into the lungs 2 (two) times daily. 10.7 g 11   No facility-administered medications prior to visit.    Review of Systems  Constitutional: Negative for chills, fever and weight loss.  HENT: Positive for congestion.        Postnasal drip  Eyes: Negative.   Respiratory: Positive for cough. Negative for sputum production, shortness of breath and wheezing.   Cardiovascular: Negative for chest pain and leg swelling.  Gastrointestinal: Negative for heartburn, nausea and vomiting.       Recent episode of colitis-symptoms resolved.  Genitourinary: Negative.   Musculoskeletal: Negative for joint pain and myalgias.  Skin: Negative for rash.  Neurological: Negative for dizziness and focal weakness.  Psychiatric/Behavioral: The patient is nervous/anxious.      Objective:   Vitals:   05/13/19 1041  BP: 120/72  Pulse: 66  Temp: 97.7 F (36.5 C)  TempSrc: Temporal  SpO2: 96%  Weight: 140 lb 9.6 oz (63.8 kg)  Height: _0  (1.702 m)   96% on  RA BMI Readings from Last 3 Encounters:  05/13/19 22.02 kg/m  03/16/19 21.61 kg/m  03/10/19 21.46 kg/m   Wt Readings from Last 3 Encounters:  05/13/19 140 lb 9.6 oz (63.8  kg)  03/16/19 138 lb (62.6 kg)  03/10/19 137 lb (62.1 kg)    Physical Exam Vitals reviewed.  Constitutional:      General: She is not in acute distress.     Comments: Chronically ill-appearing  HENT:     Head: Normocephalic and atraumatic.     Nose:     Comments: Deferred due to masking requirement.    Mouth/Throat:     Comments: Deferred due to masking requirement. Eyes:     General: No scleral icterus. Cardiovascular:     Rate and Rhythm: Normal rate and regular rhythm.     Heart sounds: No murmur.  Pulmonary:     Comments: Breathing comfortably on room air, no conversational dyspnea.  Clear to auscultation bilaterally. Abdominal:     General: There is no distension.     Palpations: Abdomen is soft.     Tenderness: There is no abdominal tenderness.  Musculoskeletal:        General: No swelling or deformity.     Cervical back: Neck supple.  Lymphadenopathy:     Cervical: No cervical adenopathy.  Skin:    General: Skin is warm and dry.     Findings: No rash.  Neurological:     General: No focal deficit present.     Mental Status: She is alert.     Motor: No weakness.     Coordination: Coordination normal.  Psychiatric:        Mood and Affect: Mood normal.        Behavior: Behavior normal.       Chest Imaging- films reviewed: CT C-spine 08/04/2015- lung images reviewed- panlobular emphysema, apical scarring  CT chest 03/11/2018-report reviewed-emphysema, apical scarring, small lung nodules, the largest being 5 mm in diameter.  No mediastinal or hilar adenopathy.  Postsurgical changes in the right hepatic lobe and small hypodensities in the left hepatic lobe-stable.  Per CT report as far back as May 2014 there has been a right upper lobe 5 mm nodule.  CT chest 02/24/2019- severe centrilobular emphysema.  Nodule in RUL, RML, lingula, LLL.  No significant mediastinal or hilar adenopathy.  4 cm ascending thoracic aortic aneurysm.  Pulmonary Functions Testing Results: No flowsheet data found.    Spirometry from [facility] (no standards available for comparison of FEV1 and FVC)- both read as moderate  obstruction 10/20/2018: FVC 3.29 L FEV1 1.89 L Ratio 57%  04/21/2018: FVC 2.85 L FEV1 1.57 L Ratio 55%  03/17/2018: FVC 2.37 L FEV1 1.25 L Ratio 53%  10/30/2016: 6-minute walk test-97% on room air at rest, desaturated to 90% on room air during ambulation and recovered to 93% on room air by the end of the test.  Ambulated 660 feet.  Pathology (from [facility]): 2014 liver masses- cholangiocarcinoma  Echocardiogram 08/03/2016: Outside report reviewed LVEF 60 to 65%, no regional wall motion abnormalities, normal diastolic function.  Normal LA, RV, RA.  Mild AR, trace MR     Assessment & Plan:     ICD-10-CM   1. Lung nodules  R91.8 CT Chest Wo Contrast  2. Tobacco abuse  Z72.0 CT Chest Wo Contrast  3. COPD with chronic bronchitis and emphysema ([place])  J44.9 CT Chest Wo Contrast    COPD with panlobular emphysema-GOLD group A.  No previous exacerbations. -Discussed the importance of smoking cessation.  I offered my help however I can, but she feels that there is no good solution to her smoking. -Continue Breztri 2 puffs twice daily and albuterol  as needed -Up-to-date on flu and pneumonia vaccines.  Recommend Covid vaccine when it is available.  Continue Covid precautions-mask wearing, social distancing, handwashing. -Continue physical activity as much as she is able. -Made multiple suggestions for activities she can do at home to entertain herself instead of smoking.  Tobacco abuse -Discussed the importance of quitting smoking and potential tools to quit.  She feels that when the weather improves and she is not so bored she will have an easier time cutting back again.  She is reluctant to try any pharmacological therapies at this time.  Pulmonary nodules-high risk given ongoing tobacco abuse and history of cholangiocarcinoma. -Needs follow-up and lung cancer screening CT due to her ongoing smoking.  She has been requested to bring a disk of her most recent CT from [facility] to  have loaded into the system for comparison. -Repeat CT November 2021   RTC in 6 months.  25 minutes spent on this encounter including record review, time spent face-to-face with patient, and charting.   Current Outpatient Medications:  .  albuterol (VENTOLIN HFA) 108 (90 Base) MCG/ACT inhaler, Inhale 2 puffs into the lungs every 6 (six) hours as needed for wheezing or shortness of breath., Disp: 6.7 g, Rfl: 11 .  Budeson-Glycopyrrol-Formoterol 160-9-4.8 MCG/ACT AERO, Inhale 2 puffs into the lungs 2 (two) times daily., Disp: 160 g, Rfl: 11 .  diclofenac sodium (VOLTAREN) 1 % GEL, Apply 4 g topically 4 (four) times daily. To affected joint., Disp: 100 g, Rfl: 11 .  valACYclovir (VALTREX) 1000 MG tablet, Take 1 tablet (1,000 mg total) by mouth daily., Disp: 5 tablet, Rfl: 6   Julian Hy, DO [facility] 05/13/2019 10:47 AM

## 2019-07-21 ENCOUNTER — Ambulatory Visit (INDEPENDENT_AMBULATORY_CARE_PROVIDER_SITE_OTHER): Payer: Medicare Other | Admitting: Osteopathic Medicine

## 2019-07-21 ENCOUNTER — Other Ambulatory Visit: Payer: Self-pay

## 2019-07-21 ENCOUNTER — Encounter: Payer: Self-pay | Admitting: Osteopathic Medicine

## 2019-07-21 VITALS — BP 123/86 | HR 80 | Ht 67.0 in | Wt 142.0 lb

## 2019-07-21 DIAGNOSIS — F17210 Nicotine dependence, cigarettes, uncomplicated: Secondary | ICD-10-CM | POA: Diagnosis not present

## 2019-07-21 DIAGNOSIS — E782 Mixed hyperlipidemia: Secondary | ICD-10-CM

## 2019-07-21 DIAGNOSIS — Z86010 Personal history of colonic polyps: Secondary | ICD-10-CM | POA: Diagnosis not present

## 2019-07-21 DIAGNOSIS — Z1509 Genetic susceptibility to other malignant neoplasm: Secondary | ICD-10-CM

## 2019-07-21 DIAGNOSIS — Z0000 Encounter for general adult medical examination without abnormal findings: Secondary | ICD-10-CM | POA: Diagnosis not present

## 2019-07-21 DIAGNOSIS — Z1501 Genetic susceptibility to malignant neoplasm of breast: Secondary | ICD-10-CM

## 2019-07-21 DIAGNOSIS — C229 Malignant neoplasm of liver, not specified as primary or secondary: Secondary | ICD-10-CM

## 2019-07-21 NOTE — Progress Notes (Signed)
Erin [name] is a 59 y.o. female who presents to  [place] at [facility]  today, 07/21/19, seeking care for the following: . Annual physical      ASSESSMENT & PLAN with other pertinent history/findings:  The primary encounter diagnosis was Annual physical exam. Diagnoses of Mixed hyperlipidemia, History of colon polyps, Cigarette smoker, BRCA2 genetic carrier, and Malignant neoplasm of liver, unspecified liver malignancy type ([place]) were also pertinent to this visit.   Needs: Bone density test (pt states will schedule w/ her mammogram) Lung CA screening per pulmonary Shingrix vaccine - defer for now  COVID vaccine #2  Constitutional:  . VSS, see nurse notes . General Appearance: alert, well-developed, well-nourished, NAD Eyes: Marland Kitchen Normal lids and conjunctive, non-icteric sclera . PERRLA Neck: . No masses, trachea midline . No thyroid enlargement/tenderness/mass appreciated Respiratory: . Normal respiratory effort . No dullness/hyper-resonance to percussion . Breath sounds normal, no wheeze/rhonchi/rales Cardiovascular: . S1/S2 normal, no murmur/rub/gallop auscultated . No lower extremity edema Gastrointestinal: . Nontender, no masses . No hepatomegaly, no splenomegaly . No hernia appreciated Musculoskeletal:  . Gait normal . No clubbing/cyanosis of digits Neurological: . No cranial nerve deficit on limited exam . Motor and sensation intact and symmetric Psychiatric: . Normal judgment/insight . Normal mood and affect       Patient Instructions  General Preventive Care  Most recent routine screening labs: ordered today.   Everyone should have blood pressure checked once per year. Goal 130/80 or less.   Tobacco: don't! Please let me know if you need help quitting!  Alcohol: responsible moderation is ok for most adults - if you have concerns about your alcohol intake, please talk to me!   Exercise: as tolerated  to reduce risk of cardiovascular disease and diabetes. Strength training will also prevent osteoporosis.   Mental health: if need for mental health care (medicines, counseling, other), or concerns about moods, please let me know!   Sexual health: if need for STD testing, or if concerns with libido/pain problems, please let me know!  Advanced Directive: Living Will and/or Healthcare Power of Attorney recommended for all adults, regardless of age or health.  Vaccines  Flu vaccine: for almost everyone, every fall.   Shingles vaccine: after age 73. Looks like you had the old Zostavax, you are eligible for the new Shingrix vaccine if desired.    Pneumonia vaccines: booster after age 34  Tetanus booster: every 10 years, due 2022  COVID vaccine: as soon as you're eligible! Please follow [email or web address]  Cancer screenings   Colon cancer screening: will request records from last colonoscopy  Breast cancer screening: per oncology  Cervical cancer screening: Pap not needed w/ hysterectomy. Continue pelvic exams per OBGYN.   Lung cancer screening: CT chest every year for those age 75 to 59 years old with ?30 pack year smoking history, who either currently smoke or have quit within the past 15 years. You are eligible for this screening, plase discuss with your pulmonologist  Infection screenings  . HIV: recommended screening at least once age 8-65, more often as needed. . Gonorrhea/Chlamydia: screening as needed . Hepatitis C: recommended once for everyone age 71-75 . TB: certain at-risk populations, or depending on work requirements and/or travel history Other . Bone Density Test: recommended for women at age 34+ who have a history of smoking     Orders Placed This Encounter  Procedures  . DG Bone Density  . CBC  . COMPLETE METABOLIC PANEL WITH GFR  .  Lipid panel    No orders of the defined types were placed in this encounter.      Follow-up instructions: Return in about 1  year (around 07/20/2020) for [place] (call week prior to visit for lab orders).                                         BP 123/86   Pulse 80   Ht '5\' 7"'  (1.702 m)   Wt 142 lb (64.4 kg)   SpO2 97%   BMI 22.24 kg/m   Current Meds  Medication Sig  . albuterol (VENTOLIN HFA) 108 (90 Base) MCG/ACT inhaler Inhale 2 puffs into the lungs every 6 (six) hours as needed for wheezing or shortness of breath.  . Budeson-Glycopyrrol-Formoterol 160-9-4.8 MCG/ACT AERO Inhale 2 puffs into the lungs 2 (two) times daily. (Patient taking differently: Inhale 2 puffs into the lungs 2 (two) times daily. Breztri)  . diclofenac sodium (VOLTAREN) 1 % GEL Apply 4 g topically 4 (four) times daily. To affected joint.  . valACYclovir (VALTREX) 1000 MG tablet Take 1 tablet (1,000 mg total) by mouth daily.    No results found for this or any previous visit (from the past 72 hour(s)).  No results found.  Depression screen [facility] 2/9 07/21/2019 01/20/2019  Decreased Interest 1 0  Down, Depressed, Hopeless 0 0  PHQ - 2 Score 1 0  Altered sleeping 3 3  Tired, decreased energy 0 0  Change in appetite 3 0  Feeling bad or failure about yourself  0 0  Trouble concentrating 1 0  Moving slowly or fidgety/restless 0 0  Suicidal thoughts 0 0  PHQ-9 Score 8 3  Difficult doing work/chores Not difficult at all -    GAD 7 : Generalized Anxiety Score 07/21/2019 01/20/2019  Nervous, Anxious, on Edge 2 0  Control/stop worrying 1 0  Worry too much - different things 1 0  Trouble relaxing 1 0  Restless 1 0  Easily annoyed or irritable 1 0  Afraid - awful might happen 0 0  Total GAD 7 Score 7 0      All questions at time of visit were answered - patient instructed to contact office with any additional concerns or updates.  ER/RTC precautions were reviewed with the patient.  Please note: voice recognition software was used to produce this document, and typos may escape review. Please contact  Dr. Sheppard Coil for any needed clarifications.

## 2019-07-21 NOTE — Patient Instructions (Addendum)
General Preventive Care  Most recent routine screening labs: ordered today.   Everyone should have blood pressure checked once per year. Goal 130/80 or less.   Tobacco: don't! Please let me know if you need help quitting!  Alcohol: responsible moderation is ok for most adults - if you have concerns about your alcohol intake, please talk to me!   Exercise: as tolerated to reduce risk of cardiovascular disease and diabetes. Strength training will also prevent osteoporosis.   Mental health: if need for mental health care (medicines, counseling, other), or concerns about moods, please let me know!   Sexual health: if need for STD testing, or if concerns with libido/pain problems, please let me know!  Advanced Directive: Living Will and/or Healthcare Power of Attorney recommended for all adults, regardless of age or health.  Vaccines  Flu vaccine: for almost everyone, every fall.   Shingles vaccine: after age 46. Looks like you had the old Zostavax, you are eligible for the new Shingrix vaccine if desired.    Pneumonia vaccines: booster after age 73  Tetanus booster: every 10 years, due 2022  COVID vaccine: as soon as you're eligible! Please follow [email or web address]  Cancer screenings   Colon cancer screening: will request records from last colonoscopy  Breast cancer screening: per oncology  Cervical cancer screening: Pap not needed w/ hysterectomy. Continue pelvic exams per OBGYN.   Lung cancer screening: CT chest every year for those age 60 to 59 years old with ?30 pack year smoking history, who either currently smoke or have quit within the past 15 years. You are eligible for this screening, plase discuss with your pulmonologist  Infection screenings  . HIV: recommended screening at least once age 89-65, more often as needed. . Gonorrhea/Chlamydia: screening as needed . Hepatitis C: recommended once for everyone age 53-75 . TB: certain at-risk populations, or depending on work  requirements and/or travel history Other . Bone Density Test: recommended for women at age 25+ who have a history of smoking

## 2019-07-23 ENCOUNTER — Encounter: Payer: Self-pay | Admitting: Osteopathic Medicine

## 2019-08-05 ENCOUNTER — Ambulatory Visit (INDEPENDENT_AMBULATORY_CARE_PROVIDER_SITE_OTHER): Payer: Medicare Other

## 2019-08-05 ENCOUNTER — Other Ambulatory Visit: Payer: Self-pay

## 2019-08-05 DIAGNOSIS — F17210 Nicotine dependence, cigarettes, uncomplicated: Secondary | ICD-10-CM | POA: Diagnosis not present

## 2019-08-05 DIAGNOSIS — Z0000 Encounter for general adult medical examination without abnormal findings: Secondary | ICD-10-CM

## 2019-08-05 DIAGNOSIS — Z780 Asymptomatic menopausal state: Secondary | ICD-10-CM | POA: Diagnosis not present

## 2019-08-05 DIAGNOSIS — M85852 Other specified disorders of bone density and structure, left thigh: Secondary | ICD-10-CM

## 2019-08-06 ENCOUNTER — Telehealth: Payer: Self-pay | Admitting: *Deleted

## 2019-08-06 DIAGNOSIS — L989 Disorder of the skin and subcutaneous tissue, unspecified: Secondary | ICD-10-CM

## 2019-08-06 DIAGNOSIS — Z419 Encounter for procedure for purposes other than remedying health state, unspecified: Secondary | ICD-10-CM

## 2019-08-06 NOTE — Telephone Encounter (Signed)
Pt called and LVM requesting a referral to Derm for spots on her body that need to be checked. She states that she would like a woman when she is referred.D'Arcy Abraha Zimmerman Rumple, CMA

## 2019-08-07 ENCOUNTER — Telehealth: Payer: Self-pay

## 2019-08-07 NOTE — Telephone Encounter (Signed)
Pt called requesting a Dermatology referral. She has some concerning spots on her face and back. Requesting a location in [place] and female provider. Referral pended.

## 2019-08-07 NOTE — Addendum Note (Signed)
Addended by: Maryla Morrow on: 08/07/2019 12:36 PM   Modules accepted: Orders

## 2019-08-13 DIAGNOSIS — L570 Actinic keratosis: Secondary | ICD-10-CM | POA: Diagnosis not present

## 2019-08-13 DIAGNOSIS — L814 Other melanin hyperpigmentation: Secondary | ICD-10-CM | POA: Diagnosis not present

## 2019-08-13 DIAGNOSIS — D225 Melanocytic nevi of trunk: Secondary | ICD-10-CM | POA: Diagnosis not present

## 2019-08-13 DIAGNOSIS — L578 Other skin changes due to chronic exposure to nonionizing radiation: Secondary | ICD-10-CM | POA: Diagnosis not present

## 2019-08-13 DIAGNOSIS — L820 Inflamed seborrheic keratosis: Secondary | ICD-10-CM | POA: Diagnosis not present

## 2019-08-18 DIAGNOSIS — E782 Mixed hyperlipidemia: Secondary | ICD-10-CM | POA: Diagnosis not present

## 2019-08-18 DIAGNOSIS — C229 Malignant neoplasm of liver, not specified as primary or secondary: Secondary | ICD-10-CM | POA: Diagnosis not present

## 2019-08-18 DIAGNOSIS — Z0000 Encounter for general adult medical examination without abnormal findings: Secondary | ICD-10-CM | POA: Diagnosis not present

## 2019-08-18 DIAGNOSIS — Z86010 Personal history of colonic polyps: Secondary | ICD-10-CM | POA: Diagnosis not present

## 2019-08-18 LAB — COMPLETE METABOLIC PANEL WITH GFR
AG Ratio: 2 (calc) (ref 1.0–2.5)
ALT: 15 U/L (ref 6–29)
AST: 19 U/L (ref 10–35)
Albumin: 4.6 g/dL (ref 3.6–5.1)
Alkaline phosphatase (APISO): 80 U/L (ref 37–153)
BUN: 14 mg/dL (ref 7–25)
CO2: 27 mmol/L (ref 20–32)
Calcium: 9.8 mg/dL (ref 8.6–10.4)
Chloride: 106 mmol/L (ref 98–110)
Creat: 0.86 mg/dL (ref 0.50–1.05)
GFR, Est African American: 86 mL/min/{1.73_m2} (ref >=60)
GFR, Est Non African American: 74 mL/min/{1.73_m2} (ref >=60)
Globulin: 2.3 g/dL (calc) (ref 1.9–3.7)
Glucose, Bld: 98 mg/dL (ref 65–99)
Potassium: 4.1 mmol/L (ref 3.5–5.3)
Sodium: 141 mmol/L (ref 135–146)
Total Bilirubin: 0.5 mg/dL (ref 0.2–1.2)
Total Protein: 6.9 g/dL (ref 6.1–8.1)

## 2019-08-18 LAB — LIPID PANEL
Cholesterol: 238 mg/dL — ABNORMAL HIGH (ref <200)
HDL: 52 mg/dL (ref >=50)
LDL Cholesterol (Calc): 155 mg/dL (calc) — ABNORMAL HIGH
Non-HDL Cholesterol (Calc): 186 mg/dL (calc) — ABNORMAL HIGH (ref <130)
Total CHOL/HDL Ratio: 4.6 (calc) (ref <5.0)
Triglycerides: 172 mg/dL — ABNORMAL HIGH (ref <150)

## 2019-08-18 LAB — CBC
HCT: 42.2 % (ref 35.0–45.0)
Hemoglobin: 14.1 g/dL (ref 11.7–15.5)
MCH: 31.5 pg (ref 27.0–33.0)
MCHC: 33.4 g/dL (ref 32.0–36.0)
MCV: 94.4 fL (ref 80.0–100.0)
MPV: 11.3 fL (ref 7.5–12.5)
Platelets: 233 10*3/uL (ref 140–400)
RBC: 4.47 10*6/uL (ref 3.80–5.10)
RDW: 11.8 % (ref 11.0–15.0)
WBC: 5.1 10*3/uL (ref 3.8–10.8)

## 2019-09-29 ENCOUNTER — Ambulatory Visit: Payer: Medicare Other | Admitting: Sports Medicine

## 2019-10-15 ENCOUNTER — Ambulatory Visit (INDEPENDENT_AMBULATORY_CARE_PROVIDER_SITE_OTHER): Payer: Medicare Other | Admitting: Sports Medicine

## 2019-10-15 ENCOUNTER — Other Ambulatory Visit: Payer: Self-pay

## 2019-10-15 ENCOUNTER — Encounter: Payer: Self-pay | Admitting: Sports Medicine

## 2019-10-15 DIAGNOSIS — M797 Fibromyalgia: Secondary | ICD-10-CM

## 2019-10-15 DIAGNOSIS — M7062 Trochanteric bursitis, left hip: Secondary | ICD-10-CM | POA: Diagnosis not present

## 2019-10-15 DIAGNOSIS — M7061 Trochanteric bursitis, right hip: Secondary | ICD-10-CM

## 2019-10-15 MED ORDER — DULOXETINE HCL 30 MG PO CPEP: 30.0000 mg | ORAL_CAPSULE | Freq: Every day | ORAL | 3 refills | Status: DC

## 2019-10-15 NOTE — Progress Notes (Addendum)
    Procedures performed today:    Procedure: Real-time Ultrasound Guided injection of the left greater trochanteric bursa Device: Samsung HS60  Verbal informed consent obtained.  Time-out conducted.  Noted no overlying erythema, induration, or other signs of local infection.  Skin prepped in a sterile fashion.  Local anesthesia: Topical Ethyl chloride.  With sterile technique and under real time ultrasound guidance: 1 cc Kenalog 40, 2 cc lidocaine, 2 cc bupivacaine injected easily Completed without difficulty  Pain immediately resolved suggesting accurate placement of the medication.  Advised to call if fevers/chills, erythema, induration, drainage, or persistent bleeding.  Images permanently stored and available for review in the ultrasound unit.  Impression: Technically successful ultrasound guided injection.  Procedure: Real-time Ultrasound Guided injection of the right greater trochanteric bursa Device: Samsung HS60  Verbal informed consent obtained.  Time-out conducted.  Noted no overlying erythema, induration, or other signs of local infection.  Skin prepped in a sterile fashion.  Local anesthesia: Topical Ethyl chloride.  With sterile technique and under real time ultrasound guidance: 1 cc Kenalog 40, 2 cc lidocaine, 2 cc bupivacaine injected easily Completed without difficulty  Pain immediately resolved suggesting accurate placement of the medication.  Advised to call if fevers/chills, erythema, induration, drainage, or persistent bleeding.  Images permanently stored and available for review in the ultrasound unit.  Impression: Technically successful ultrasound guided injection.  Independent interpretation of notes and tests performed by another provider:   None.  Brief History, Exam, Impression, and Recommendations:    Greater trochanteric bursitis of both hips Erin [name] returns, she is a 59 year old female, we injected her back in October of last year, she did not do  physical therapy in [facility], and she did not do any therapy in [place], but got some rehab exercises, her hip abductor strength is very weak bilaterally, and I am concerned as to whether or not she is actually doing the rehab. Today we injected both greater trochanteric bursae, she really needs to get more diligent with the home rehab exercises, I will give her 6 weeks, and if her hip abductor's are still weak we will put her into formal therapy. Ultimately I think she is under rehab. Of note her MRI did show some hip arthritis and a possible para labral cyst. If she does not get good improvement we can address the hip joint.  Fibromyalgia And he also has widespread aches and pains, with pain out of proportion with the degree of palpation over her shoulder girdle, hip girdle. There is likely an element of fibromyalgia/myofascial pain syndrome, we discussed the mechanism, and she is agreeable to start duloxetine 30 mg daily, with an up titration in 6 weeks if needed.    ___________________________________________ Gwen Her. Dianah Field, M.D., ABFM., CAQSM. Primary Care and [place] [employer] [facility]

## 2019-10-15 NOTE — Patient Instructions (Signed)
Hip Rehabilitation Protocol:  1.  Side leg raises.  3x30 with no weight, then 3x15 with 2 lb ankle weight, then 3x15 with 5 lb ankle weight 2.  Standing hip rotation.  3x30 with no weight, then 3x15 with 2 lb ankle weight, then 3x15 with 5 lb ankle weight. 3.  Side step ups.  3x30 with no weight, then 3x15 with 5 lbs in backpack, then 3x15 with 10 lbs in backpack. 

## 2019-10-15 NOTE — Addendum Note (Signed)
Addended by: Silverio Decamp on: 10/15/2019 09:22 AM   Modules accepted: Orders

## 2019-10-15 NOTE — Assessment & Plan Note (Signed)
And he also has widespread aches and pains, with pain out of proportion with the degree of palpation over her shoulder girdle, hip girdle. There is likely an element of fibromyalgia/myofascial pain syndrome, we discussed the mechanism, and she is agreeable to start duloxetine 30 mg daily, with an up titration in 6 weeks if needed.

## 2019-10-15 NOTE — Assessment & Plan Note (Signed)
Erin [name] returns, she is a 59 year old female, we injected her back in October of last year, she did not do physical therapy in [facility], and she did not do any therapy in [place], but got some rehab exercises, her hip abductor strength is very weak bilaterally, and I am concerned as to whether or not she is actually doing the rehab. Today we injected both greater trochanteric bursae, she really needs to get more diligent with the home rehab exercises, I will give her 6 weeks, and if her hip abductor's are still weak we will put her into formal therapy. Ultimately I think she is under rehab. Of note her MRI did show some hip arthritis and a possible para labral cyst. If she does not get good improvement we can address the hip joint.

## 2019-11-25 ENCOUNTER — Ambulatory Visit: Payer: Medicare Other | Admitting: Sports Medicine

## 2020-01-01 ENCOUNTER — Encounter: Payer: Self-pay | Admitting: Nurse Practitioner

## 2020-01-01 ENCOUNTER — Ambulatory Visit (INDEPENDENT_AMBULATORY_CARE_PROVIDER_SITE_OTHER): Payer: Medicare Other

## 2020-01-01 ENCOUNTER — Other Ambulatory Visit: Payer: Self-pay

## 2020-01-01 ENCOUNTER — Ambulatory Visit (INDEPENDENT_AMBULATORY_CARE_PROVIDER_SITE_OTHER): Payer: Medicare Other | Admitting: Nurse Practitioner

## 2020-01-01 DIAGNOSIS — Z23 Encounter for immunization: Secondary | ICD-10-CM | POA: Diagnosis not present

## 2020-01-01 DIAGNOSIS — M545 Low back pain: Secondary | ICD-10-CM | POA: Diagnosis not present

## 2020-01-01 DIAGNOSIS — M549 Dorsalgia, unspecified: Secondary | ICD-10-CM | POA: Diagnosis not present

## 2020-01-01 MED ORDER — PREDNISONE 20 MG PO TABS: 20.0000 mg | ORAL_TABLET | Freq: Two times a day (BID) | ORAL | 0 refills | Status: DC

## 2020-01-01 MED ORDER — CYCLOBENZAPRINE HCL 5 MG PO TABS: 5.0000 mg | ORAL_TABLET | Freq: Three times a day (TID) | ORAL | 1 refills | Status: DC | PRN

## 2020-01-01 MED ORDER — KETOROLAC TROMETHAMINE 60 MG/2ML IM SOLN
60.0000 mg | Freq: Once | INTRAMUSCULAR | Status: AC
  Administered 2020-01-01: 60 mg via INTRAMUSCULAR

## 2020-01-01 NOTE — Assessment & Plan Note (Signed)
Flu vaccine provided today.  
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
## 2020-01-04 ENCOUNTER — Telehealth: Payer: Self-pay

## 2020-01-04 ENCOUNTER — Telehealth (INDEPENDENT_AMBULATORY_CARE_PROVIDER_SITE_OTHER): Payer: Medicare Other | Admitting: Nurse Practitioner

## 2020-01-04 DIAGNOSIS — Z5329 Procedure and treatment not carried out because of patient's decision for other reasons: Secondary | ICD-10-CM
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
## 2020-01-04 NOTE — Telephone Encounter (Signed)
Pt agreeable for scheduling a VV this afternoon with Emeterio Reeve Early, DNP, AGNP-C at 3:30 PM.

## 2020-01-04 NOTE — Progress Notes (Signed)
Xray shows no signs of fracture or damage to the bone. Continue with treatment as recommended for musculoskeletal injury.

## 2020-01-04 NOTE — Progress Notes (Signed)
Unable to contact patient for virtual visit at 1530 today.  Telephone message x 4 and text message sent for intake and to start visit.

## 2020-01-05 ENCOUNTER — Telehealth: Payer: Self-pay

## 2020-01-05 ENCOUNTER — Encounter: Payer: Self-pay | Admitting: Family Medicine

## 2020-01-05 ENCOUNTER — Telehealth (INDEPENDENT_AMBULATORY_CARE_PROVIDER_SITE_OTHER): Payer: Medicare Other | Admitting: Family Medicine

## 2020-01-05 DIAGNOSIS — J441 Chronic obstructive pulmonary disease with (acute) exacerbation: Secondary | ICD-10-CM | POA: Diagnosis not present

## 2020-01-05 DIAGNOSIS — F411 Generalized anxiety disorder: Secondary | ICD-10-CM | POA: Insufficient documentation

## 2020-01-05 MED ORDER — BENZONATATE 200 MG PO CAPS: 200.0000 mg | ORAL_CAPSULE | Freq: Three times a day (TID) | ORAL | 0 refills | Status: DC | PRN

## 2020-01-05 NOTE — Telephone Encounter (Signed)
Task completed. Pt has been scheduled a virtual appointment today with Dr. Zigmund Daniel. No other inquiries during the call.

## 2020-01-05 NOTE — Telephone Encounter (Signed)
Chills/malaise normal after vaccination/ Respiratory symptoms are not and she may have something else going on coincidentally. Needs virtual visit or UC eval

## 2020-01-05 NOTE — Assessment & Plan Note (Signed)
Increased cough and shortness of breath.  O2 sats still look ok at home, recommend continuing to monitor.  Continue current inhalers Already taking prednisone, continue.  Recommend COVID testing, she will have this completed.  Tessalon perles added for cough.  Instructed to contact clinic or seek emergency care for worsening symptoms or declining O2 sats.
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
## 2020-01-05 NOTE — Telephone Encounter (Signed)
Pt left a vm msg stating that since getting the Flu vaccine on 01/01/20, she has been having a cough, running nose and chills. Pt has history of asthma. She missed her virtual visit with Emeterio Reeve yesterday. Pls advise, thanks.

## 2020-01-05 NOTE — Telephone Encounter (Signed)
Patient has a virtual with Dr. Rodena Piety.

## 2020-01-19 ENCOUNTER — Other Ambulatory Visit: Payer: Self-pay | Admitting: Sports Medicine

## 2020-01-19 DIAGNOSIS — M797 Fibromyalgia: Secondary | ICD-10-CM

## 2020-02-03 ENCOUNTER — Telehealth: Payer: Self-pay | Admitting: Critical Care Medicine

## 2020-02-03 NOTE — Telephone Encounter (Signed)
Medication name and strength: Breztri Aerosphere 160-9-4.68mcg Provider: [place]: [facility] Patient insurance ID: [id number] Phone: [phone] Fax: [phone]  Was the PA started on CMM?  yes If yes, please enter the Key: [facility] Timeframe for approval/denial: [employer] is reviewing your PA request. Typically an electronic response will be received within 72 hours. To check for an update later, open this request from your dashboard.  Will hold open to check for status update.

## 2020-02-04 ENCOUNTER — Other Ambulatory Visit: Payer: Self-pay | Admitting: Critical Care Medicine

## 2020-02-04 DIAGNOSIS — J449 Chronic obstructive pulmonary disease, unspecified: Secondary | ICD-10-CM

## 2020-02-04 DIAGNOSIS — J431 Panlobular emphysema: Secondary | ICD-10-CM

## 2020-02-05 ENCOUNTER — Telehealth: Payer: Self-pay | Admitting: Critical Care Medicine

## 2020-02-05 NOTE — Telephone Encounter (Signed)
Called patient and pharmacy to let them know PA was approved. Nothing further needed at this time.

## 2020-02-05 NOTE — Telephone Encounter (Signed)
Erin [name], [place]     02/05/20 3:52 PM Note Pa for breztri 160-9 4.8 mcg was approved pharmacy was notified starting 04/17/2019-04/15/2020       Called patient and pharmacy to let them know PA was approved. Nothing further needed at this time.

## 2020-02-05 NOTE — Telephone Encounter (Signed)
Pa for breztri 160-9 4.8 mcg was approved pharmacy was notified starting 04/17/2019-04/15/2020

## 2020-03-01 ENCOUNTER — Other Ambulatory Visit: Payer: Self-pay | Admitting: Critical Care Medicine

## 2020-03-01 DIAGNOSIS — J449 Chronic obstructive pulmonary disease, unspecified: Secondary | ICD-10-CM

## 2020-03-01 DIAGNOSIS — J431 Panlobular emphysema: Secondary | ICD-10-CM

## 2020-03-15 ENCOUNTER — Inpatient Hospital Stay: Payer: Medicare Other

## 2020-03-15 ENCOUNTER — Inpatient Hospital Stay: Payer: Medicare Other | Admitting: Hematology & Oncology

## 2020-03-15 ENCOUNTER — Ambulatory Visit (HOSPITAL_BASED_OUTPATIENT_CLINIC_OR_DEPARTMENT_OTHER): Payer: Medicare Other

## 2020-03-15 ENCOUNTER — Other Ambulatory Visit: Payer: Medicare Other

## 2020-03-15 ENCOUNTER — Ambulatory Visit: Payer: Medicare Other | Admitting: Hematology

## 2020-03-17 ENCOUNTER — Other Ambulatory Visit: Payer: Self-pay | Admitting: Family

## 2020-03-17 DIAGNOSIS — C221 Intrahepatic bile duct carcinoma: Secondary | ICD-10-CM

## 2020-04-28 ENCOUNTER — Other Ambulatory Visit: Payer: Self-pay | Admitting: *Deleted

## 2020-04-28 DIAGNOSIS — C221 Intrahepatic bile duct carcinoma: Secondary | ICD-10-CM

## 2020-04-29 ENCOUNTER — Encounter: Payer: Self-pay | Admitting: Hematology & Oncology

## 2020-04-29 ENCOUNTER — Ambulatory Visit (HOSPITAL_BASED_OUTPATIENT_CLINIC_OR_DEPARTMENT_OTHER)
Admission: RE | Admit: 2020-04-29 | Discharge: 2020-04-29 | Disposition: A | Discharge status: Home / Self Care | Payer: Medicare Other | Source: Ambulatory Visit | Attending: Family | Admitting: Family

## 2020-04-29 ENCOUNTER — Other Ambulatory Visit (HOSPITAL_BASED_OUTPATIENT_CLINIC_OR_DEPARTMENT_OTHER): Payer: Medicare Other

## 2020-04-29 ENCOUNTER — Inpatient Hospital Stay (HOSPITAL_BASED_OUTPATIENT_CLINIC_OR_DEPARTMENT_OTHER): Payer: Medicare Other | Admitting: Hematology & Oncology

## 2020-04-29 ENCOUNTER — Inpatient Hospital Stay: Payer: Medicare Other | Attending: Hematology & Oncology

## 2020-04-29 ENCOUNTER — Encounter (HOSPITAL_BASED_OUTPATIENT_CLINIC_OR_DEPARTMENT_OTHER): Payer: Self-pay

## 2020-04-29 ENCOUNTER — Other Ambulatory Visit: Payer: Self-pay

## 2020-04-29 ENCOUNTER — Ambulatory Visit: Payer: Medicare Other | Admitting: Hematology & Oncology

## 2020-04-29 VITALS — BP 136/88 | HR 70 | Temp 98.1°F | Resp 18 | Wt 133.0 lb

## 2020-04-29 DIAGNOSIS — R918 Other nonspecific abnormal finding of lung field: Secondary | ICD-10-CM | POA: Diagnosis not present

## 2020-04-29 DIAGNOSIS — C221 Intrahepatic bile duct carcinoma: Secondary | ICD-10-CM

## 2020-04-29 DIAGNOSIS — Z8505 Personal history of malignant neoplasm of liver: Secondary | ICD-10-CM | POA: Diagnosis not present

## 2020-04-29 DIAGNOSIS — Z1509 Genetic susceptibility to other malignant neoplasm: Secondary | ICD-10-CM | POA: Diagnosis not present

## 2020-04-29 DIAGNOSIS — Z9889 Other specified postprocedural states: Secondary | ICD-10-CM | POA: Diagnosis not present

## 2020-04-29 DIAGNOSIS — K7689 Other specified diseases of liver: Secondary | ICD-10-CM | POA: Diagnosis not present

## 2020-04-29 DIAGNOSIS — J432 Centrilobular emphysema: Secondary | ICD-10-CM | POA: Diagnosis not present

## 2020-04-29 DIAGNOSIS — Z1501 Genetic susceptibility to malignant neoplasm of breast: Secondary | ICD-10-CM

## 2020-04-29 DIAGNOSIS — I700 Atherosclerosis of aorta: Secondary | ICD-10-CM | POA: Diagnosis not present

## 2020-04-29 LAB — CBC WITH DIFFERENTIAL (CANCER CENTER ONLY)
Abs Immature Granulocytes: 0.01 10*3/uL (ref 0.00–0.07)
Basophils Absolute: 0.1 10*3/uL (ref 0.0–0.1)
Basophils Relative: 1 %
Eosinophils Absolute: 0.1 10*3/uL (ref 0.0–0.5)
Eosinophils Relative: 1 %
HCT: 40.4 % (ref 36.0–46.0)
Hemoglobin: 13.7 g/dL (ref 12.0–15.0)
Immature Granulocytes: 0 %
Lymphocytes Relative: 31 %
Lymphs Abs: 2 10*3/uL (ref 0.7–4.0)
MCH: 31.7 pg (ref 26.0–34.0)
MCHC: 33.9 g/dL (ref 30.0–36.0)
MCV: 93.5 fL (ref 80.0–100.0)
Monocytes Absolute: 0.4 10*3/uL (ref 0.1–1.0)
Monocytes Relative: 7 %
Neutro Abs: 3.9 10*3/uL (ref 1.7–7.7)
Neutrophils Relative %: 60 %
Platelet Count: 276 10*3/uL (ref 150–400)
RBC: 4.32 MIL/uL (ref 3.87–5.11)
RDW: 12.2 % (ref 11.5–15.5)
WBC Count: 6.4 10*3/uL (ref 4.0–10.5)
nRBC: 0 % (ref 0.0–0.2)

## 2020-04-29 LAB — COMPREHENSIVE METABOLIC PANEL
ALT: 9 U/L (ref 0–44)
AST: 17 U/L (ref 15–41)
Albumin: 4.7 g/dL (ref 3.5–5.0)
Alkaline Phosphatase: 70 U/L (ref 38–126)
Anion gap: 10 (ref 5–15)
BUN: 11 mg/dL (ref 6–20)
CO2: 29 mmol/L (ref 22–32)
Calcium: 10.2 mg/dL (ref 8.9–10.3)
Chloride: 101 mmol/L (ref 98–111)
Creatinine, Ser: 0.95 mg/dL (ref 0.44–1.00)
GFR, Estimated: >60 mL/min (ref >60)
Glucose, Bld: 101 mg/dL — ABNORMAL HIGH (ref 70–99)
Potassium: 3.8 mmol/L (ref 3.5–5.1)
Sodium: 140 mmol/L (ref 135–145)
Total Bilirubin: 0.8 mg/dL (ref 0.3–1.2)
Total Protein: 7 g/dL (ref 6.5–8.1)

## 2020-04-29 MED ORDER — IOHEXOL 300 MG/ML  SOLN
100.0000 mL | Freq: Once | INTRAMUSCULAR | Status: AC | PRN
  Administered 2020-04-29: 100 mL via INTRAVENOUS

## 2020-04-29 NOTE — Progress Notes (Signed)
Hematology and Oncology Follow Up Visit  Erin [name] [id number] [birth year] 60 y.o. 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  She is seen yearly.  She was last seen back in November 2020.  She is [name].  She has a very incredible history of a early-stage cholangiocarcinoma that was resected back in June 2015.  It does not look like she had any neoadjuvant or adjuvant therapy.  She is followed by CT scans.  She had a CT scan done today.  Thankfully, the CT scan did not show any evidence of recurrent cholangiocarcinoma.  She feels well.  She is getting ready for the winter weekend.  She has had no problems with bowels or bladder.  She has not had a mammogram for a couple years.  She has had that she is BRCA2 positive.  She really needs to have a mammogram.  She has had her last colonoscopy a year ago.  There is no cough or shortness of breath.  She has avoided the coronavirus.  She has had no leg swelling.  There are no rashes.  She has had no headache.  Overall, her performance status is ECOG 0.  Medications:  Current Outpatient Medications:  .  DULoxetine (CYMBALTA) 30 MG capsule, TAKE 1 CAPSULE(30 MG) BY MOUTH DAILY, Disp: 90 capsule, Rfl: 3 .  albuterol (VENTOLIN HFA) 108 (90 Base) MCG/ACT inhaler, Inhale 2 puffs into the lungs every 6 (six) hours as needed for wheezing or shortness of breath., Disp: 6.7 g, Rfl: 11 .  benzonatate (TESSALON) 200 MG capsule, Take 1 capsule (200 mg total) by mouth 3 (three) times daily as needed for cough., Disp: 30 capsule, Rfl: 0 .  BREZTRI AEROSPHERE 160-9-4.8 MCG/ACT AERO, INHALE 2 PUFFS INTO THE LUNGS TWICE DAILY, Disp: 10.7 g, Rfl: 1 .  diclofenac sodium (VOLTAREN) 1 % GEL, Apply 4 g topically 4 (four) times daily. To affected joint.,  Disp: 100 g, Rfl: 11  Allergies:  Allergies  Allergen Reactions  . Bee Venom Anaphylaxis  . Butalbital-Apap-Caffeine Other (See Comments)    Abd. pain  . Singulair  [Montelukast Sodium] Other (See Comments)    "Sinus infection for months"  . Tylenol [Acetaminophen] Other (See Comments)    Can not take due to patient's liver cancer and resection.  Marland Kitchen Amoxicillin Diarrhea    Severe diarrhea  . Lyrica [Pregabalin] Itching    Past Medical History, Surgical history, Social history, and Family History were reviewed and updated.  Review of Systems: Review of Systems  Constitutional: Negative.   HENT:  Negative.   Eyes: Negative.   Respiratory: Negative.   Cardiovascular: Negative.   Gastrointestinal: Negative.   Endocrine: Negative.   Genitourinary: Negative.    Musculoskeletal: Negative.   Skin: Negative.   Neurological: Negative.   Hematological: Negative.   Psychiatric/Behavioral: Negative.     Physical Exam:  weight is 133 lb (60.3 kg). Her oral temperature is 98.1 F (36.7 C). Her blood pressure is 136/88 and her pulse is 70. Her respiration is 18 and oxygen saturation is 99%.   Wt Readings from Last 3 Encounters:  04/29/20 133 lb (60.3 kg)  01/01/20 136 lb (61.7 kg)  07/21/19 142 lb (64.4 kg)    Physical Exam Vitals reviewed.  HENT:     Head:  Normocephalic and atraumatic.     Mouth/Throat:     Mouth: Oropharynx is clear and moist.  Eyes:     Extraocular Movements: EOM normal.     Pupils: Pupils are equal, round, and reactive to light.  Cardiovascular:     Rate and Rhythm: Normal rate and regular rhythm.     Heart sounds: Normal heart sounds.  Pulmonary:     Effort: Pulmonary effort is normal.     Breath sounds: Normal breath sounds.  Abdominal:     General: Bowel sounds are normal.     Palpations: Abdomen is soft.  Musculoskeletal:        General: No tenderness, deformity or edema. Normal range of motion.     Cervical back: Normal range of motion.   Lymphadenopathy:     Cervical: No cervical adenopathy.  Skin:    General: Skin is warm and dry.     Findings: No erythema or rash.  Neurological:     Mental Status: She is alert and oriented to person, place, and time.  Psychiatric:        Mood and Affect: Mood and affect normal.        Behavior: Behavior normal.        Thought Content: Thought content normal.        Judgment: Judgment normal.      Lab Results  Component Value Date   WBC 6.4 04/29/2020   HGB 13.7 04/29/2020   HCT 40.4 04/29/2020   MCV 93.5 04/29/2020   PLT 276 04/29/2020     Chemistry      Component Value Date/Time   NA 140 04/29/2020 0752   K 3.8 04/29/2020 0752   CL 101 04/29/2020 0752   CO2 29 04/29/2020 0752   BUN 11 04/29/2020 0752   CREATININE 0.95 04/29/2020 0752   CREATININE 0.86 08/18/2019 0817      Component Value Date/Time   CALCIUM 10.2 04/29/2020 0752   ALKPHOS 70 04/29/2020 0752   AST 17 04/29/2020 0752   AST 16 03/16/2019 0833   ALT 9 04/29/2020 0752   ALT 8 03/16/2019 0833   BILITOT 0.8 04/29/2020 0752   BILITOT 0.7 03/16/2019 0833      Impression and Plan: Erin [name] is a very charming 60 year old white female.  She is originally from [place].  She is doing quite well.  I would have to say she should be cured of this early stage cholangiocarcinoma.  I do not think we have to get her back to the office.  It is now been six and half years since she had surgery.  She has had no recurrence.  There is nothing on her CT scan that would show any problems.  She certainly can come back to see [place] if she has any issues.  Again, she has a BRCA2 mutation.  We have to make sure that she gets her mammogram.   Volanda Napoleon, MD 1/14/202210:10 AM

## 2020-04-30 ENCOUNTER — Other Ambulatory Visit: Payer: Self-pay | Admitting: Critical Care Medicine

## 2020-04-30 DIAGNOSIS — J431 Panlobular emphysema: Secondary | ICD-10-CM

## 2020-04-30 DIAGNOSIS — J449 Chronic obstructive pulmonary disease, unspecified: Secondary | ICD-10-CM

## 2020-04-30 LAB — CANCER ANTIGEN 19-9: CA 19-9: 9 U/mL (ref 0–35)

## 2020-05-02 ENCOUNTER — Telehealth: Payer: Self-pay

## 2020-05-02 NOTE — Telephone Encounter (Signed)
No 04-29-20 los   aom

## 2020-05-31 ENCOUNTER — Telehealth (INDEPENDENT_AMBULATORY_CARE_PROVIDER_SITE_OTHER): Payer: Medicare Other | Admitting: Osteopathic Medicine

## 2020-05-31 ENCOUNTER — Encounter: Payer: Self-pay | Admitting: Osteopathic Medicine

## 2020-05-31 ENCOUNTER — Telehealth: Payer: Self-pay | Admitting: Osteopathic Medicine

## 2020-05-31 VITALS — BP 136/87

## 2020-05-31 DIAGNOSIS — F5101 Primary insomnia: Secondary | ICD-10-CM | POA: Diagnosis not present

## 2020-05-31 MED ORDER — BELSOMRA 10 MG PO TABS: 1.0000 | ORAL_TABLET | Freq: Every day | ORAL | 0 refills | Status: DC

## 2020-05-31 MED ORDER — ZOLPIDEM TARTRATE 5 MG PO TABS: 2.5000 mg | ORAL_TABLET | Freq: Every evening | ORAL | 0 refills | Status: DC | PRN

## 2020-05-31 NOTE — Telephone Encounter (Signed)
Mandy from [facility] needs a return call back to verify to Prescriptions for this patient since they are both for sleep and controlled: Belsomra and Ambien  Call [facility] at [phone]

## 2020-05-31 NOTE — Progress Notes (Signed)
Telemedicine Visit via  Audio only - telephone (patient preference /  technical difficulty with MyChart video application)  I connected with Erin [name] on 05/31/20 at 9:15 AM  by phone or  telemedicine application as noted above  I verified that I am speaking with or regarding  the correct patient using two identifiers.  Participants: Myself, Dr Emeterio Reeve DO Patient: Erin [name] Patient proxy if applicable: none Other, if applicable: none  Patient is at home I am in office at [facility]    I discussed the limitations of evaluation and management  by telemedicine and the availability of in person appointments.  The participant(s) above expressed understanding and  agreed to proceed with this appointment via telemedicine.       History of Present Illness: Erin [name] is a 60 y.o. female who would like to discuss insomnia. Not sleeping for several days in a row, then will sleep a full night. Ongoing at this point for 3 weeks. Tried melatonin which causes headaches, and Z-Quil which causes nausea/vomiting. Ongoing insomnia problems but never this bad.  Mental health: feels isolated d/t pandemic, limited human interaction, helping her neighbor with some house work projects. No concerns for anxiety/depression. Ambien in the past - 10-15 years ago.      Observations/Objective: BP 136/87   SpO2 94%  BP Readings from Last 3 Encounters:  05/31/20 136/87  04/29/20 136/88  01/05/20 (!) 180/76   Exam: Normal Speech.  NAD  Lab and Radiology Results No results found for this or any previous visit (from the past 72 hour(s)). No results found.     Assessment and Plan: 60 y.o. female with The encounter diagnosis was Primary insomnia.  Ambien short-term Plan to transition to Belsomra longer-term given better safety profile    PDMP not reviewed this encounter. No orders of the defined types were placed in this encounter.  Meds ordered this  encounter  Medications  . zolpidem (AMBIEN) 5 MG tablet    Sig: Take 0.5-1 tablets (2.5-5 mg total) by mouth at bedtime as needed for sleep.    Dispense:  15 tablet    Refill:  0  . Suvorexant (BELSOMRA) 10 MG TABS    Sig: Take 1 tablet by mouth at bedtime.    Dispense:  30 tablet    Refill:  0   There are no Patient Instructions on file for this visit.  Instructions sent via MyChart.   Follow Up Instructions: Return in about 4 weeks (around 06/28/2020) for VIRTUAL *OR* IN-OFFICE VISIT MONITOR INSOMNIA .    I discussed the assessment and treatment plan with the patient. The patient was provided an opportunity to ask questions and all were answered. The patient agreed with the plan and demonstrated an understanding of the instructions.   The patient was advised to call back or seek an in-person evaluation if any new concerns, if symptoms worsen or if the condition fails to improve as anticipated.  21 minutes of non-face-to-face time was provided during this encounter.      . . . . . . . . . . . . . Marland Kitchen                   Historical information moved to improve visibility of documentation.  Past Medical History:  Diagnosis Date  . Anxiety   . Arthritis    hands and feet  . ASCUS (atypical squamous cells of undetermined significance) on Pap smear   . Blood transfusion 1983  In [place]  . BRCA2 gene mutation positive 2014  . Bursitis   . Cancer ([place])    liver  . Chronic constipation   . Condyloma   . COPD (chronic obstructive pulmonary disease) ([place])   . Depression    per pt h/o depression- no meds currently. Dx of bi-polar depression in EPIC  . Headache(784.0)    migraines  . Hot flashes, menopausal   . HPV (human papilloma virus) infection   . Lung nodule, multiple   . Osteopenia   . Seizures ([place])    unknown reason for seizures- last seizurein Sept 2012  . TIA (transient ischemic attack)    Past Surgical History:  Procedure  Laterality Date  . ABDOMINOPLASTY    . LAPAROSCOPY  01/04/2011   Procedure: LAPAROSCOPY OPERATIVE;  Surgeon: Juliene Pina C. Hulan Fray, MD;  Location: [facility];  Service: Gynecology;  Laterality: N/A;  . LAPAROSCOPY  06/14   gall bladder and 1/2 liver  . SALPINGOOPHORECTOMY  01/04/2011   Procedure: SALPINGO OOPHERECTOMY;  Surgeon: Myra C. Hulan Fray, MD;  Location: [facility];  Service: Gynecology;  Laterality: Bilateral;  . TOTAL VAGINAL HYSTERECTOMY  60 yrs old   Social History   Tobacco Use  . Smoking status: Current Some Day Smoker    Packs/day: 1.00    Years: 40.00    Pack years: 40.00    Types: Cigarettes  . Smokeless tobacco: Never Used  Substance Use Topics  . Alcohol use: No    Comment: One beer two weeks ago.   family history includes Alcohol abuse in her daughter; Breast cancer in her daughter, maternal aunt, and mother; Depression in her mother; Drug abuse in her father, mother, and sister; Lung cancer in her father.  Medications: Current Outpatient Medications  Medication Sig Dispense Refill  . albuterol (VENTOLIN HFA) 108 (90 Base) MCG/ACT inhaler Inhale 2 puffs into the lungs every 6 (six) hours as needed for wheezing or shortness of breath. 6.7 g 11  . benzonatate (TESSALON) 200 MG capsule Take 1 capsule (200 mg total) by mouth 3 (three) times daily as needed for cough. 30 capsule 0  . BREZTRI AEROSPHERE 160-9-4.8 MCG/ACT AERO INHALE 2 PUFFS INTO THE LUNGS TWICE DAILY 10.7 g 0  . diclofenac sodium (VOLTAREN) 1 % GEL Apply 4 g topically 4 (four) times daily. To affected joint. 100 g 11  . DULoxetine (CYMBALTA) 30 MG capsule TAKE 1 CAPSULE(30 MG) BY MOUTH DAILY 90 capsule 3  . Suvorexant (BELSOMRA) 10 MG TABS Take 1 tablet by mouth at bedtime. 30 tablet 0  . zolpidem (AMBIEN) 5 MG tablet Take 0.5-1 tablets (2.5-5 mg total) by mouth at bedtime as needed for sleep. 15 tablet 0   No current facility-administered medications for this visit.   Allergies  Allergen Reactions  . Bee Venom  Anaphylaxis  . Butalbital-Apap-Caffeine Other (See Comments)    Abd. pain  . Singulair  [Montelukast Sodium] Other (See Comments)    "Sinus infection for months"  . Tylenol [Acetaminophen] Other (See Comments)    Can not take due to patient's liver cancer and resection.  Marland Kitchen Amoxicillin Diarrhea    Severe diarrhea  . Lyrica [Pregabalin] Itching     If phone visit, billing and coding can please add appropriate modifier if needed

## 2020-06-03 ENCOUNTER — Other Ambulatory Visit: Payer: Self-pay | Admitting: Critical Care Medicine

## 2020-06-03 DIAGNOSIS — J431 Panlobular emphysema: Secondary | ICD-10-CM

## 2020-06-03 DIAGNOSIS — J449 Chronic obstructive pulmonary disease, unspecified: Secondary | ICD-10-CM

## 2020-06-03 NOTE — Telephone Encounter (Signed)
Contacted the pharmacy and spoke to the pharmacist [place]. She was informed that the zolpidem rx is short term and that pt will transition to belsomra for long term use. Pharmacist will update their chart to reflect provider's note. No other inquiries during the call.

## 2020-06-07 ENCOUNTER — Ambulatory Visit (INDEPENDENT_AMBULATORY_CARE_PROVIDER_SITE_OTHER): Payer: Medicare Other | Admitting: Sports Medicine

## 2020-06-07 DIAGNOSIS — M797 Fibromyalgia: Secondary | ICD-10-CM | POA: Diagnosis not present

## 2020-06-07 DIAGNOSIS — M25552 Pain in left hip: Secondary | ICD-10-CM

## 2020-06-07 DIAGNOSIS — M25551 Pain in right hip: Secondary | ICD-10-CM | POA: Diagnosis not present

## 2020-06-07 DIAGNOSIS — G8929 Other chronic pain: Secondary | ICD-10-CM

## 2020-06-07 MED ORDER — PREGABALIN 75 MG PO CAPS
ORAL_CAPSULE | ORAL | 3 refills | Status: DC
Start: 2020-06-07 — End: 2020-07-05

## 2020-06-07 MED ORDER — DULOXETINE HCL 60 MG PO CPEP
60.0000 mg | ORAL_CAPSULE | Freq: Every day | ORAL | 3 refills | Status: DC
Start: 2020-06-07 — End: 2020-07-05

## 2020-06-07 NOTE — Progress Notes (Signed)
    Procedures performed today:    None.  Independent interpretation of notes and tests performed by another provider:   None.  Brief History, Exam, Impression, and Recommendations:    Chronic hip pain, bilateral Injury has multifactorial chronic bilateral hip pain, she has known osteoarthritis, bilateral trochanteric bursitis, she also has L4-L5 lumbar spinal stenosis, she only responds minimally and briefly to trochanteric bursa injections, I did review a CT scan from recently, she does have moderate spinal stenosis at L4-L5. We will add Lyrica 75 mg for use 1-2 times a day. She did not respond well to gabapentin in the past. Also going up on her Cymbalta, she has some seasonal affective disorder, and as we know uncontrolled depression makes it impossible to control musculoskeletal symptoms. We do a telephone virtual visit in 4 to 6 weeks.     ___________________________________________ Gwen Her. Dianah Field, M.D., ABFM., CAQSM. Primary Care and [place] Instructor of [place] of [facility] [employer]

## 2020-06-07 NOTE — Assessment & Plan Note (Signed)
Injury has multifactorial chronic bilateral hip pain, she has known osteoarthritis, bilateral trochanteric bursitis, she also has L4-L5 lumbar spinal stenosis, she only responds minimally and briefly to trochanteric bursa injections, I did review a CT scan from recently, she does have moderate spinal stenosis at L4-L5. We will add Lyrica 75 mg for use 1-2 times a day. She did not respond well to gabapentin in the past. Also going up on her Cymbalta, she has some seasonal affective disorder, and as we know uncontrolled depression makes it impossible to control musculoskeletal symptoms. We do a telephone virtual visit in 4 to 6 weeks.

## 2020-06-14 ENCOUNTER — Telehealth: Payer: Self-pay

## 2020-06-14 NOTE — Telephone Encounter (Signed)
Was she able to fill Belsomra Rx? Short-term Rx for Ambien, I do NOT Rx this long-term

## 2020-06-14 NOTE — Telephone Encounter (Signed)
Pt called stating that she continues to have sleeping problems. Current rx is not working. Pls advise, thanks.

## 2020-06-15 NOTE — Telephone Encounter (Signed)
OK thanks for finding out! When she's due for refill on the Belsomra she can have the pharmacy send [place] a refill request

## 2020-06-15 NOTE — Telephone Encounter (Signed)
Per pt, she was unable to get the Belsomra rx from the pharmacy. Pt now has the rx on hand and took her first dose last night. States that the [employer] rx worked really well. No other concerns during the call.

## 2020-06-16 NOTE — Telephone Encounter (Signed)
Noted  

## 2020-06-28 ENCOUNTER — Other Ambulatory Visit: Payer: Self-pay | Admitting: Critical Care Medicine

## 2020-06-28 DIAGNOSIS — J431 Panlobular emphysema: Secondary | ICD-10-CM

## 2020-06-28 DIAGNOSIS — J449 Chronic obstructive pulmonary disease, unspecified: Secondary | ICD-10-CM

## 2020-06-29 ENCOUNTER — Telehealth: Payer: Self-pay | Admitting: Critical Care Medicine

## 2020-06-29 DIAGNOSIS — J449 Chronic obstructive pulmonary disease, unspecified: Secondary | ICD-10-CM

## 2020-06-29 DIAGNOSIS — J431 Panlobular emphysema: Secondary | ICD-10-CM

## 2020-06-29 MED ORDER — BREZTRI AEROSPHERE 160-9-4.8 MCG/ACT IN AERO: 2.0000 | INHALATION_SPRAY | Freq: Two times a day (BID) | RESPIRATORY_TRACT | 2 refills | Status: DC

## 2020-06-29 MED ORDER — ALBUTEROL SULFATE HFA 108 (90 BASE) MCG/ACT IN AERS: 2.0000 | INHALATION_SPRAY | Freq: Four times a day (QID) | RESPIRATORY_TRACT | 2 refills | Status: DC | PRN

## 2020-06-29 NOTE — Telephone Encounter (Signed)
Called and spoke with patient. She stated that she needed a refill on her Breztri and albuterol inhalers. I advised her that since it had been a year since we had seen her, we would need to get her scheduled for an OV. Also informed her that Dr. Carlis Abbott was no longer seeing patients in office. She was not aware of this. I did attempt to get her scheduled with Dr. Shearon Stalls (as she preferred a female provider) but she needed to make sure she has someone willing to bring her to the office. She will call back next week to get scheduled for an OV.   RX has been sent.   Nothing further needed at time of call.

## 2020-06-30 ENCOUNTER — Ambulatory Visit: Payer: Medicare Other | Admitting: Osteopathic Medicine

## 2020-07-05 ENCOUNTER — Telehealth (INDEPENDENT_AMBULATORY_CARE_PROVIDER_SITE_OTHER): Payer: Medicare Other | Admitting: Sports Medicine

## 2020-07-05 DIAGNOSIS — M25552 Pain in left hip: Secondary | ICD-10-CM

## 2020-07-05 DIAGNOSIS — M25551 Pain in right hip: Secondary | ICD-10-CM | POA: Diagnosis not present

## 2020-07-05 DIAGNOSIS — G8929 Other chronic pain: Secondary | ICD-10-CM

## 2020-07-05 NOTE — Progress Notes (Signed)
   Virtual Visit via Telephone   I connected with  Erin [name]  on 07/05/20 by telephone/telehealth and verified that I am speaking with the correct person using two identifiers.   I discussed the limitations, risks, security and privacy concerns of performing an evaluation and management service by telephone, including the higher likelihood of inaccurate diagnosis and treatment, and the availability of in person appointments.  We also discussed the likely need of an additional face to face encounter for complete and high quality delivery of care.  I also discussed with the patient that there may be a patient responsible charge related to this service. The patient expressed understanding and wishes to proceed.  Provider location is in medical facility. Patient location is at their home, different from provider location. People involved in care of the patient during this telehealth encounter were myself, my nurse/medical assistant, and my front office/scheduling team member.  Review of Systems: No fevers, chills, night sweats, weight loss, chest pain, or shortness of breath.   Objective Findings:    General: Speaking full sentences, no audible heavy breathing.  Sounds alert and appropriately interactive.    Independent interpretation of tests performed by another provider:   None.  Brief History, Exam, Impression, and Recommendations:    Chronic hip pain, bilateral Erin [name] is a 60 year old female we have been treating for about 2 years now for chronic bilateral hip pain.  She has known osteoarthritis, bilateral trochanteric bursitis, L4-L5 lumbar spinal stenosis. Historically she has told me she has only responded minimally and briefly to trochanteric bursa injections, so at the last visit we added Lyrica as well as some Cymbalta. Today on the phone she tells me these medications have not helped at all, she has no desire to go up on the dose, and she told me that the hip injections have  worked well for a long time. She would like to come back for another set of hip injections, she last had greater trochanteric bursa injection in July of last year, happy to do it again. Discontinuing the 2 medications.   I discussed the above assessment and treatment plan with the patient. The patient was provided an opportunity to ask questions and all were answered. The patient agreed with the plan and demonstrated an understanding of the instructions.   The patient was advised to call back or seek an in-person evaluation if the symptoms worsen or if the condition fails to improve as anticipated.   I provided 30 minutes of face to face and non-face-to-face time during this encounter date, time was needed to gather information, review chart, records, communicate/coordinate with staff remotely, as well as complete documentation.   ___________________________________________ Gwen Her. Dianah Field, M.D., ABFM., CAQSM. Primary Care and Sports Medicine [place] [facility]  Adjunct Professor of [place] of [facility] of Medicine

## 2020-07-05 NOTE — Assessment & Plan Note (Signed)
Erin [name] is a 60 year old female we have been treating for about 2 years now for chronic bilateral hip pain.  She has known osteoarthritis, bilateral trochanteric bursitis, L4-L5 lumbar spinal stenosis. Historically she has told me she has only responded minimally and briefly to trochanteric bursa injections, so at the last visit we added Lyrica as well as some Cymbalta. Today on the phone she tells me these medications have not helped at all, she has no desire to go up on the dose, and she told me that the hip injections have worked well for a long time. She would like to come back for another set of hip injections, she last had greater trochanteric bursa injection in July of last year, happy to do it again. Discontinuing the 2 medications.

## 2020-07-11 ENCOUNTER — Ambulatory Visit (INDEPENDENT_AMBULATORY_CARE_PROVIDER_SITE_OTHER): Payer: Medicare Other

## 2020-07-11 ENCOUNTER — Other Ambulatory Visit: Payer: Self-pay

## 2020-07-11 ENCOUNTER — Ambulatory Visit (INDEPENDENT_AMBULATORY_CARE_PROVIDER_SITE_OTHER): Payer: Medicare Other | Admitting: Sports Medicine

## 2020-07-11 DIAGNOSIS — Z96641 Presence of right artificial hip joint: Secondary | ICD-10-CM

## 2020-07-11 DIAGNOSIS — G8929 Other chronic pain: Secondary | ICD-10-CM

## 2020-07-11 DIAGNOSIS — M25552 Pain in left hip: Secondary | ICD-10-CM

## 2020-07-11 DIAGNOSIS — M25551 Pain in right hip: Secondary | ICD-10-CM

## 2020-07-11 NOTE — Progress Notes (Signed)
    Procedures performed today:    Procedure: Real-time Ultrasound Guided injection of the left greater trochanteric bursa Device: Samsung HS60  Verbal informed consent obtained.  Time-out conducted.  Noted no overlying erythema, induration, or other signs of local infection.  Skin prepped in a sterile fashion.  Local anesthesia: Topical Ethyl chloride.  With sterile technique and under real time ultrasound guidance:  Noted normal-appearing hip abductors, 1 cc Kenalog 40, 2 cc lidocaine, 2 cc bupivacaine injected easily Completed without difficulty  Advised to call if fevers/chills, erythema, induration, drainage, or persistent bleeding.  Images permanently stored and available for review in PACS.  Impression: Technically successful ultrasound guided injection.  Procedure: Real-time Ultrasound Guided injection of the right greater trochanteric bursa Device: Samsung HS60  Verbal informed consent obtained.  Time-out conducted.  Noted no overlying erythema, induration, or other signs of local infection.  Skin prepped in a sterile fashion.  Local anesthesia: Topical Ethyl chloride.  With sterile technique and under real time ultrasound guidance:  Noted normal-appearing hip abductors, 1 cc Kenalog 40, 2 cc lidocaine, 2 cc bupivacaine injected easily Completed without difficulty  Advised to call if fevers/chills, erythema, induration, drainage, or persistent bleeding.  Images permanently stored and available for review in PACS.  Impression: Technically successful ultrasound guided injection.  Independent interpretation of notes and tests performed by another provider:   None.  Brief History, Exam, Impression, and Recommendations:    Chronic hip pain, bilateral This is a 60 year old female with chronic bilateral hip pain, she has known hip osteoarthritis, bilateral trochanteric bursitis, L4-L5 lumbar spinal stenosis. Today she is requesting repeat bilateral trochanteric bursa  injections, [name] pain is lateral. We continue with Lyrica and Cymbalta. At this point we are going to proceed with MRI of [name] lumbar spine, x-rays were done less than a year ago. I would also like an MRI of [name] right hip, she will need updated x-rays of the right hip.    ___________________________________________ Erin [name]. Dianah Field, M.D., ABFM., CAQSM. Primary Care and [place] Instructor of [place] of [facility] of Medicine

## 2020-07-11 NOTE — Assessment & Plan Note (Signed)
This is a 60 year old female with chronic bilateral hip pain, she has known hip osteoarthritis, bilateral trochanteric bursitis, L4-L5 lumbar spinal stenosis. Today she is requesting repeat bilateral trochanteric bursa injections, her pain is lateral. We continue with Lyrica and Cymbalta. At this point we are going to proceed with MRI of her lumbar spine, x-rays were done less than a year ago. I would also like an MRI of her right hip, she will need updated x-rays of the right hip.

## 2020-07-12 ENCOUNTER — Ambulatory Visit (INDEPENDENT_AMBULATORY_CARE_PROVIDER_SITE_OTHER): Payer: Medicare Other | Admitting: Osteopathic Medicine

## 2020-07-12 ENCOUNTER — Encounter: Payer: Self-pay | Admitting: Osteopathic Medicine

## 2020-07-12 VITALS — BP 128/80 | HR 73 | Temp 98.2°F | Wt 132.0 lb

## 2020-07-12 DIAGNOSIS — Z96641 Presence of right artificial hip joint: Secondary | ICD-10-CM | POA: Diagnosis not present

## 2020-07-12 DIAGNOSIS — G4709 Other insomnia: Secondary | ICD-10-CM | POA: Diagnosis not present

## 2020-07-12 DIAGNOSIS — Z85828 Personal history of other malignant neoplasm of skin: Secondary | ICD-10-CM | POA: Diagnosis not present

## 2020-07-12 DIAGNOSIS — M7062 Trochanteric bursitis, left hip: Secondary | ICD-10-CM

## 2020-07-12 DIAGNOSIS — M7061 Trochanteric bursitis, right hip: Secondary | ICD-10-CM

## 2020-07-12 MED ORDER — BELSOMRA 20 MG PO TABS: 20.0000 mg | ORAL_TABLET | Freq: Every day | ORAL | 2 refills | Status: DC

## 2020-07-12 MED ORDER — FLUTICASONE PROPIONATE 50 MCG/ACT NA SUSP
2.0000 | Freq: Every day | NASAL | 3 refills | Status: DC
Start: 2020-07-12 — End: 2020-11-25

## 2020-07-12 MED ORDER — LORATADINE 10 MG PO TABS: 10.0000 mg | ORAL_TABLET | Freq: Every day | ORAL | 3 refills | Status: DC

## 2020-07-12 NOTE — Patient Instructions (Addendum)
Referral to orthopedics for second opinion on hip pain  Insomnia: increase Belsomra to 20 mg  Allergies: Rx sent for Flonase + Claritin to take together Referral to dermatology to evaluate for skin cancer
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
## 2020-07-18 ENCOUNTER — Ambulatory Visit: Payer: Medicare Other

## 2020-07-18 ENCOUNTER — Other Ambulatory Visit: Payer: Self-pay

## 2020-07-20 ENCOUNTER — Ambulatory Visit: Payer: Self-pay

## 2020-07-20 ENCOUNTER — Ambulatory Visit (INDEPENDENT_AMBULATORY_CARE_PROVIDER_SITE_OTHER): Payer: Medicare Other | Admitting: Orthopaedic Surgery

## 2020-07-20 DIAGNOSIS — M25552 Pain in left hip: Secondary | ICD-10-CM

## 2020-07-20 MED ORDER — PREDNISONE 10 MG (21) PO TBPK: ORAL_TABLET | ORAL | 0 refills | Status: DC
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
## 2020-07-25 ENCOUNTER — Ambulatory Visit (INDEPENDENT_AMBULATORY_CARE_PROVIDER_SITE_OTHER): Payer: Medicare Other

## 2020-07-25 ENCOUNTER — Other Ambulatory Visit: Payer: Self-pay

## 2020-07-25 DIAGNOSIS — G8929 Other chronic pain: Secondary | ICD-10-CM

## 2020-07-25 DIAGNOSIS — M25552 Pain in left hip: Secondary | ICD-10-CM | POA: Diagnosis not present

## 2020-07-25 DIAGNOSIS — Z96641 Presence of right artificial hip joint: Secondary | ICD-10-CM

## 2020-07-25 DIAGNOSIS — M25551 Pain in right hip: Secondary | ICD-10-CM

## 2020-07-25 DIAGNOSIS — M545 Low back pain, unspecified: Secondary | ICD-10-CM | POA: Diagnosis not present

## 2020-08-09 ENCOUNTER — Other Ambulatory Visit: Payer: Self-pay

## 2020-08-09 ENCOUNTER — Ambulatory Visit (INDEPENDENT_AMBULATORY_CARE_PROVIDER_SITE_OTHER): Payer: Medicare Other | Admitting: Pulmonary Disease

## 2020-08-09 ENCOUNTER — Encounter: Payer: Self-pay | Admitting: Pulmonary Disease

## 2020-08-09 VITALS — BP 116/72 | HR 75 | Temp 97.2°F | Ht 67.0 in | Wt 135.0 lb

## 2020-08-09 DIAGNOSIS — R911 Solitary pulmonary nodule: Secondary | ICD-10-CM

## 2020-08-09 DIAGNOSIS — J431 Panlobular emphysema: Secondary | ICD-10-CM

## 2020-08-09 DIAGNOSIS — J449 Chronic obstructive pulmonary disease, unspecified: Secondary | ICD-10-CM

## 2020-08-09 MED ORDER — BREZTRI AEROSPHERE 160-9-4.8 MCG/ACT IN AERO: 2.0000 | INHALATION_SPRAY | Freq: Two times a day (BID) | RESPIRATORY_TRACT | 3 refills | Status: DC

## 2020-08-09 MED ORDER — ALBUTEROL SULFATE HFA 108 (90 BASE) MCG/ACT IN AERS
2.0000 | INHALATION_SPRAY | Freq: Four times a day (QID) | RESPIRATORY_TRACT | 3 refills | Status: DC | PRN
Start: 2020-08-09 — End: 2020-11-21

## 2020-08-09 NOTE — Progress Notes (Addendum)
Synopsis: Referred in October 2020 for COPD by Emeterio Reeve, DO.   Subjective:   PATIENT ID: [name] GENDER: female DOB: Jul 09, 1960, MRN: [id number]  Chief Complaint  Patient presents with  . Follow-up    Refills on albuterol and breztri.    Ms. Ninel Abdella is a 60 year old female with COPD, hx of cholangiocarcinoma with liver involvement s/p cholecystectomy and partial liver resection.   She was previously followed by Dr. Carlis Abbott. She is compliant on Breztri. Rinses her mouth with each use but concerned about getting thrush. Also states inhaler has metallic taste. At baseline, she reports heavy breathing at rest and exertion. Has occasional cough and wheeze during the day. No nighttime symptoms. She has no limitations in activity with her symptoms. She remains an active smoker and continues to due to stress with her family including her children and grandchildren. She is not interested in quitting smoking. She expresses concern about not receiving her CT lung results. Denies recent exacerbations needing steroids or antibiotics.  Social Hx: Currently smokes 1/2 ppd.   Past Medical History:  Diagnosis Date  . Anxiety   . Arthritis    hands and feet  . ASCUS (atypical squamous cells of undetermined significance) on Pap smear   . Blood transfusion 1983   In [place]  . BRCA2 gene mutation positive 2014  . Bursitis   . Cancer ([place])    liver  . Chronic constipation   . Condyloma   . COPD (chronic obstructive pulmonary disease) ([place])   . Depression    per pt h/o depression- no meds currently. Dx of bi-polar depression in EPIC  . Headache(784.0)    migraines  . Hot flashes, menopausal   . HPV (human papilloma virus) infection   . Lung nodule, multiple   . Osteopenia   . Seizures ([place])    unknown reason for seizures- last seizurein Sept 2012  . TIA (transient ischemic attack)      Family History  Problem Relation Age of Onset  . Breast cancer Mother         postmenopausal  . Depression Mother   . Drug abuse Mother   . Drug abuse Father   . Lung cancer Father   . Alcohol abuse Daughter   . Breast cancer Daughter   . Drug abuse Sister   . Breast cancer Maternal Aunt   . Ovarian cancer Neg Hx   . Uterine cancer Neg Hx      Past Surgical History:  Procedure Laterality Date  . ABDOMINOPLASTY    . LAPAROSCOPY  01/04/2011   Procedure: LAPAROSCOPY OPERATIVE;  Surgeon: Juliene Pina C. Hulan Fray, MD;  Location: [facility];  Service: Gynecology;  Laterality: N/A;  . LAPAROSCOPY  06/14   gall bladder and 1/2 liver  . SALPINGOOPHORECTOMY  01/04/2011   Procedure: SALPINGO OOPHERECTOMY;  Surgeon: Myra C. Hulan Fray, MD;  Location: [facility];  Service: Gynecology;  Laterality: Bilateral;  . TOTAL VAGINAL HYSTERECTOMY  60 yrs old    Social History   Socioeconomic History  . Marital status: Divorced    Spouse name: Not on file  . Number of children: 2  . Years of education: Not on file  . Highest education level: Not on file  Occupational History  . Not on file  Tobacco Use  . Smoking status: Current Some Day Smoker    Packs/day: 1.00    Years: 40.00    Pack years: 40.00    Types: Cigarettes  . Smokeless tobacco: Never  Used  . Tobacco comment: 0.5 pack a day  Vaping Use  . Vaping Use: Never used  Substance and Sexual Activity  . Alcohol use: No    Comment: One beer two weeks ago.  . Drug use: Not Currently    Types: Marijuana, Cocaine    Comment: history of use  . Sexual activity: Not Currently    Partners: Male  Other Topics Concern  . Not on file  Social History Narrative  . Not on file   Social Determinants of Health   Financial Resource Strain: Not on file  Food Insecurity: Not on file  Transportation Needs: Not on file  Physical Activity: Not on file  Stress: Not on file  Social Connections: Not on file  Intimate Partner Violence: Not on file     Allergies  Allergen Reactions  . Bee Venom Anaphylaxis  . Butalbital-Apap-Caffeine Other (See  Comments)    Abd. pain  . Singulair  [Montelukast Sodium] Other (See Comments)    "Sinus infection for months"  . Tylenol [Acetaminophen] Other (See Comments)    Can not take due to patient's liver cancer and resection.  Marland Kitchen Amoxicillin Diarrhea    Severe diarrhea  . Lyrica [Pregabalin] Itching     Immunization History  Administered Date(s) Administered  . Influenza,inj,Quad PF,6+ Mos 02/03/2019, 01/01/2020  . PFIZER(Purple Top)SARS-COV-2 Vaccination 07/16/2019, 08/10/2019, 01/16/2020  . Pneumococcal Polysaccharide-23 02/03/2015  . Tdap 05/08/2010  . Zoster 01/04/2014    Outpatient Medications Prior to Visit  Medication Sig Dispense Refill  . diclofenac sodium (VOLTAREN) 1 % GEL Apply 4 g topically 4 (four) times daily. To affected joint. 100 g 11  . fluticasone (FLONASE) 50 MCG/ACT nasal spray Place 2 sprays into both nostrils daily. 48 g 3  . loratadine (CLARITIN) 10 MG tablet Take 1 tablet (10 mg total) by mouth daily. 90 tablet 3  . Suvorexant (BELSOMRA) 20 MG TABS Take 20 mg by mouth at bedtime. 30 tablet 2  . albuterol (VENTOLIN HFA) 108 (90 Base) MCG/ACT inhaler Inhale 2 puffs into the lungs every 6 (six) hours as needed for wheezing or shortness of breath. 6.7 g 2  . Budeson-Glycopyrrol-Formoterol (BREZTRI AEROSPHERE) 160-9-4.8 MCG/ACT AERO Inhale 2 puffs into the lungs 2 (two) times daily. 10.7 g 2  . predniSONE (STERAPRED UNI-PAK 21 TAB) 10 MG (21) TBPK tablet Take as directed 21 tablet 0   No facility-administered medications prior to visit.    Review of Systems  Constitutional: Negative for chills, diaphoresis, fever, malaise/fatigue and weight loss.  HENT: Negative for congestion.   Respiratory: Positive for cough, shortness of breath and wheezing. Negative for hemoptysis and sputum production.   Cardiovascular: Negative for chest pain, palpitations and leg swelling.     Objective:   Vitals:   08/09/20 0918  BP: 116/72  Pulse: 75  Temp: (!) 97.2 F (36.2 C)   SpO2: 96%  Weight: 135 lb (61.2 kg)  Height: '5\' 7"'  (1.702 m)   Physical Exam: General: Well-appearing, no acute distress HENT: [place], AT Eyes: EOMI, no scleral icterus Respiratory: Clear to auscultation bilaterally.  No crackles, wheezing or rales Cardiovascular: RRR, -M/R/G, no JVD Extremities:-Edema,-tenderness Neuro: AAO x4, CNII-XII grossly intact Skin: Intact, no rashes or bruising Psych: Normal mood, normal affect  Chest Imaging- films reviewed: CT C-spine 08/04/2015- lung images reviewed- panlobular emphysema, apical scarring  CT chest 03/11/2018-report reviewed-emphysema, apical scarring, small lung nodules, the largest being 5 mm in diameter.  No mediastinal or hilar adenopathy.  Postsurgical changes in the  right hepatic lobe and small hypodensities in the left hepatic lobe-stable.  Per CT report as far back as May 2014 there has been a right upper lobe 5 mm nodule.  CT chest 02/24/2019- severe centrilobular emphysema.  Nodule in RUL, RML, lingula, LLL.  No significant mediastinal or hilar adenopathy.  4 cm ascending thoracic aortic aneurysm.  CT Chest 04/29/20 - moderate centrilobular emphysema. Stable 104m LUL and LLL.  Pulmonary Functions Testing Results: No flowsheet data found.    [facility] available for comparison of FEV1 and FVC)- both read as moderate obstruction 10/20/2018: FVC 3.29 L FEV1 1.89 L Ratio 57%  04/21/2018: FVC 2.85 L FEV1 1.57 L Ratio 55%  03/17/2018: FVC 2.37 L FEV1 1.25 L Ratio 53%  10/30/2016: 6-minute walk test-97% on room air at rest, desaturated to 90% on room air during ambulation and recovered to 93% on room air by the end of the test.  Ambulated 660 feet.  [facility] liver masses- cholangiocarcinoma  Echocardiogram 08/03/2016: Outside report reviewed LVEF 60 to 65%, no regional wall motion abnormalities, normal diastolic function.  Normal LA, RV, RA.  Mild AR, trace  MR  Imaging, labs and test noted above have been reviewed independently by me.    Assessment & Plan:   COPD with panlobular emphysema-GOLD group A.  No exacerbations >24 months --CONTINUE Breztri TWO puffs. Refilled --CONTINUE Albuterol as needed for shortness of breath or wheezing --Follow-up with pulmonary function tests prior to next visit --Will order spacer with DME  Tobacco abuse Patient is an active smoker. She is not interested in quitting. We discussed smoking cessation for >10 minutes. We discussed triggers and stressors and ways to deal with them. We discussed barriers to continued smoking and benefits of smoking cessation. Provided patient with information cessation techniques and interventions including [place] quitline.  Subcentimeter pulmonary nodules-high risk given ongoing tobacco abuse and history of cholangiocarcinoma. We reviewed CT chest report and discussed stability of left lung nodules over two years, unlikely to progress to lung cancer at this point. -Annual low dose CT scan  Health Maintenance Immunization History  Administered Date(s) Administered  . Influenza,inj,Quad PF,6+ Mos 02/03/2019, 01/01/2020  . PFIZER(Purple Top)SARS-COV-2 Vaccination 07/16/2019, 08/10/2019, 01/16/2020  . Pneumococcal Polysaccharide-23 02/03/2015  . Tdap 05/08/2010  . Zoster 01/04/2014   Orders Placed This Encounter  Procedures  . CT CHEST LUNG CA SCREEN LOW DOSE W/O CM    MCR/UHC/MEDICAID Epic order WT 135 Pt is a current/Pt has a 40 pack yr smoking hx/pt quit within the ----- yr/No symptoms of lung cancer/No special needs/no spinal cord/body inject/No to COVID [place] with [facility]@ [phone]    Standing Status:   Future    Standing Expiration Date:   08/09/2021    Order Specific Question:   Reason for Exam (SYMPTOM  OR DIAGNOSIS REQUIRED)    Answer:   cancer screening    Order Specific Question:   Is patient pregnant?    Answer:   No  . Ambulatory Referral for DME     Referral Priority:   Routine    Referral Type:   Durable Medical Equipment Purchase    Number of Visits Requested:   1  . Pulmonary function test    Standing Status:   Future    Standing Expiration Date:   08/09/2021    Order Specific Question:   Where should this test be performed?    Answer:   [facility]  Order Specific Question:   Full PFT: includes the following: basic spirometry, spirometry pre & post bronchodilator, diffusion capacity (DLCO), lung volumes    Answer:   Full PFT   Meds ordered this encounter  Medications  . albuterol (VENTOLIN HFA) 108 (90 Base) MCG/ACT inhaler    Sig: Inhale 2 puffs into the lungs every 6 (six) hours as needed for wheezing or shortness of breath.    Dispense:  6.7 g    Refill:  3  . Budeson-Glycopyrrol-Formoterol (BREZTRI AEROSPHERE) 160-9-4.8 MCG/ACT AERO    Sig: Inhale 2 puffs into the lungs 2 (two) times daily.    Dispense:  10.7 g    Refill:  3   Follow-up in 3 months, then 6 months  Return in about 3 months (around 11/08/2020).   I have spent a total time of 45-minutes on the day of the appointment reviewing prior documentation, coordinating care and discussing medical diagnosis and plan with the patient/family. Imaging, labs and tests included in this note have been reviewed and interpreted independently by me.   Luddie Boghosian Rodman Pickle, MD [facility] 08/09/2020 10:02 AM

## 2020-08-09 NOTE — Patient Instructions (Signed)
  COPD with panlobular emphysema-GOLD group A.  No exacerbations >24 months --CONTINUE Breztri TWO puffs. Refilled --CONTINUE Albuterol as needed for shortness of breath or wheezing --Follow-up with pulmonary function tests prior to next visit  Tobacco abuse Patient is an active smoker. She is not interested in quitting. We discussed smoking cessation for >10 minutes. We discussed triggers and stressors and ways to deal with them. We discussed barriers to continued smoking and benefits of smoking cessation. Provided patient with information cessation techniques and interventions including [place] quitline.  Subcentimeter pulmonary nodules-high risk given ongoing tobacco abuse and history of cholangiocarcinoma. -Annual low dose CT scan  Follow-up in 3 months, then 6 months

## 2020-08-10 ENCOUNTER — Ambulatory Visit (INDEPENDENT_AMBULATORY_CARE_PROVIDER_SITE_OTHER): Payer: Medicare Other | Admitting: Orthopaedic Surgery

## 2020-08-10 DIAGNOSIS — M25551 Pain in right hip: Secondary | ICD-10-CM | POA: Diagnosis not present

## 2020-08-10 DIAGNOSIS — G8929 Other chronic pain: Secondary | ICD-10-CM

## 2020-08-10 DIAGNOSIS — M25552 Pain in left hip: Secondary | ICD-10-CM

## 2020-08-10 MED ORDER — MELOXICAM 7.5 MG PO TABS: 7.5000 mg | ORAL_TABLET | Freq: Two times a day (BID) | ORAL | 2 refills | Status: DC | PRN
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
## 2020-08-11 ENCOUNTER — Other Ambulatory Visit: Payer: Self-pay

## 2020-08-11 DIAGNOSIS — G8929 Other chronic pain: Secondary | ICD-10-CM

## 2020-08-11 DIAGNOSIS — M545 Low back pain, unspecified: Secondary | ICD-10-CM

## 2020-08-19 ENCOUNTER — Ambulatory Visit: Payer: Medicare Other

## 2020-08-24 ENCOUNTER — Other Ambulatory Visit: Payer: Self-pay

## 2020-08-24 ENCOUNTER — Ambulatory Visit (INDEPENDENT_AMBULATORY_CARE_PROVIDER_SITE_OTHER): Payer: Medicare Other

## 2020-08-24 DIAGNOSIS — J439 Emphysema, unspecified: Secondary | ICD-10-CM | POA: Diagnosis not present

## 2020-08-24 DIAGNOSIS — R911 Solitary pulmonary nodule: Secondary | ICD-10-CM

## 2020-08-24 DIAGNOSIS — F17210 Nicotine dependence, cigarettes, uncomplicated: Secondary | ICD-10-CM | POA: Diagnosis not present

## 2020-08-24 DIAGNOSIS — I700 Atherosclerosis of aorta: Secondary | ICD-10-CM | POA: Diagnosis not present

## 2020-08-24 DIAGNOSIS — Z720 Tobacco use: Secondary | ICD-10-CM

## 2020-08-29 ENCOUNTER — Ambulatory Visit (INDEPENDENT_AMBULATORY_CARE_PROVIDER_SITE_OTHER): Payer: Medicare Other | Admitting: Physical Medicine and Rehabilitation

## 2020-08-29 ENCOUNTER — Other Ambulatory Visit: Payer: Self-pay

## 2020-08-29 ENCOUNTER — Ambulatory Visit: Payer: Self-pay

## 2020-08-29 ENCOUNTER — Encounter: Payer: Self-pay | Admitting: Physical Medicine and Rehabilitation

## 2020-08-29 VITALS — BP 149/74 | HR 76

## 2020-08-29 DIAGNOSIS — M5416 Radiculopathy, lumbar region: Secondary | ICD-10-CM | POA: Diagnosis not present

## 2020-08-29 MED ORDER — METHYLPREDNISOLONE ACETATE 80 MG/ML IJ SUSP
80.0000 mg | Freq: Once | INTRAMUSCULAR | Status: AC
  Administered 2020-08-29: 80 mg

## 2020-08-29 NOTE — Patient Instructions (Signed)

## 2020-08-29 NOTE — Progress Notes (Signed)
Pt state lower back pain that travels down to both hips. Pt state walking, standing and laying down makes the pain worse. Pt state she takes over the counter pain meds to help ease her pain.  Numeric Pain Rating Scale and Functional Assessment Average Pain 5   In the last MONTH (on 0-10 scale) has pain interfered with the following?  1. General activity like being  able to carry out your everyday physical activities such as walking, climbing stairs, carrying groceries, or moving a chair?  Rating(8)   +Driver, -BT, -Dye Allergies.

## 2020-08-29 NOTE — Progress Notes (Signed)
Erin [name] - 60 y.o. female MRN [id number]  Date of birth: Feb 09, 1961  Office Visit Note: Visit Date: 08/29/2020 PCP: Emeterio Reeve, DO Referred by: Emeterio Reeve, DO  Subjective: Chief Complaint  Patient presents with  . Lower Back - Pain  . Right Hip - Pain  . Left Hip - Pain   HPI:  Erin [name] is a 60 y.o. female who comes in today at the request of Dr. Eduard Roux for planned Bilateral L5-S1 Lumbar Transforaminal epidural steroid injection with fluoroscopic guidance.  The patient has failed conservative care including home exercise, medications, time and activity modification.  This injection will be diagnostic and hopefully therapeutic.  Please see requesting physician notes for further details and justification.  MRI of the lumbar spine reviewed with facet arthropathy at L4-5 with small listhesis and crowding of the lateral recesses which could cause an L5 radiculitis which would fit with her symptoms.  She reports severe pain to the point where she says it is just excruciating.  Prior greater trochanteric injections have been effective on some occasions and not effective on others.  She does not carry diagnosis of fibromyalgia.  Hip MRI shows degenerative labrum but no other cysts issues.  She will follow-up with Dr. Erlinda Hong should the injection not help much.  Could attempt interlaminar epidural injection if not very beneficial.   ROS Otherwise per HPI.  Assessment & Plan: Visit Diagnoses:    ICD-10-CM   1. Lumbar radiculopathy  M54.16 XR C-ARM NO REPORT    Epidural Steroid injection    methylPREDNISolone acetate (DEPO-MEDROL) injection 80 mg    Plan: No additional findings.   Meds & Orders:  Meds ordered this encounter  Medications  . methylPREDNISolone acetate (DEPO-MEDROL) injection 80 mg    Orders Placed This Encounter  Procedures  . XR C-ARM NO REPORT  . Epidural Steroid injection    Follow-up: Return for visit to requesting physician as needed.    Procedures: No procedures performed  Lumbosacral Transforaminal Epidural Steroid Injection - Sub-Pedicular Approach with Fluoroscopic Guidance  Patient: Erin [name]      Date of Birth: 04/28/1960 MRN: [id number] PCP: Emeterio Reeve, DO      Visit Date: 08/29/2020   Universal Protocol:    Date/Time: 08/29/2020  Consent Given By: the patient  Position: PRONE  Additional Comments: Vital signs were monitored before and after the procedure. Patient was prepped and draped in the usual sterile fashion. The correct patient, procedure, and site was verified.   Injection Procedure Details:   Procedure diagnoses: Lumbar radiculopathy [M54.16]    Meds Administered:  Meds ordered this encounter  Medications  . methylPREDNISolone acetate (DEPO-MEDROL) injection 80 mg    Laterality: Bilateral  Location/Site:  L5-S1  Needle:5.0 in., 22 ga.  Short bevel or Quincke spinal needle  Needle Placement: Transforaminal  Findings:    -Comments: Excellent flow of contrast along the nerve, nerve root and into the epidural space.  Patient really displaying hyperesthesia with any kind of injection in the scanner needling movement but did okay we were able to get good flow of contrast.  Procedure Details: After squaring off the end-plates to get a true AP view, the C-arm was positioned so that an oblique view of the foramen as noted above was visualized. The target area is just inferior to the "nose of the scotty dog" or sub pedicular. The soft tissues overlying this structure were infiltrated with 2-3 ml. of 1% Lidocaine without Epinephrine.  The spinal needle was inserted toward  the target using a "trajectory" view along the fluoroscope beam.  Under AP and lateral visualization, the needle was advanced so it did not puncture dura and was located close the 6 O'Clock position of the pedical in AP tracterory. Biplanar projections were used to confirm position. Aspiration was confirmed to be  negative for CSF and/or blood. A 1-2 ml. volume of Isovue-250 was injected and flow of contrast was noted at each level. Radiographs were obtained for documentation purposes.   After attaining the desired flow of contrast documented above, a 0.5 to 1.0 ml test dose of 0.25% Marcaine was injected into each respective transforaminal space.  The patient was observed for 90 seconds post injection.  After no sensory deficits were reported, and normal lower extremity motor function was noted,   the above injectate was administered so that equal amounts of the injectate were placed at each foramen (level) into the transforaminal epidural space.   Additional Comments:  No complications occurred Dressing: 2 x 2 sterile gauze and Band-Aid    Post-procedure details: Patient was observed during the procedure. Post-procedure instructions were reviewed.  Patient left the clinic in stable condition.      Clinical History: MRI LUMBAR SPINE WITHOUT CONTRAST  TECHNIQUE: Multiplanar, multisequence MR imaging of the lumbar spine was performed. No intravenous contrast was administered.  COMPARISON:  None.  FINDINGS: Segmentation:  5 lumbar type vertebrae  Alignment:  Slight anterolisthesis at L4-5.  Vertebrae:  No fracture, evidence of discitis, or bone lesion.  Conus medullaris and cauda equina: Conus extends to the L1 level. Conus and cauda equina appear normal.  Paraspinal and other soft tissues: Negative  Disc levels:  Mild disc desiccation, narrowing, and bulging at L2-3 and below. Mild facet hypertrophy at the same levels, with spurring greatest at L4-5 on the right. At L4-5 there is mild triangular narrowing of the thecal sac with crowding at the subarticular recesses. Diffusely patent foramina.  IMPRESSION: Disc bulging and overall mild facet spurring at L2-3 and below. Facet degeneration is greatest at L4-5 where there is also mild anterolisthesis and mild spinal  stenosis with crowding of the subarticular recesses.   Electronically Signed   By: Monte Fantasia M.D.   On: 07/26/2020 06:08     Objective:  VS:  HT:    WT:   BMI:     BP:(!) 149/74  HR:76bpm  TEMP: ( )  RESP:  Physical Exam Vitals and nursing note reviewed.  Constitutional:      General: She is not in acute distress.    Appearance: Normal appearance. She is not ill-appearing.  HENT:     Head: Normocephalic and atraumatic.     Right Ear: External ear normal.     Left Ear: External ear normal.  Eyes:     Extraocular Movements: Extraocular movements intact.  Cardiovascular:     Rate and Rhythm: Normal rate.     Pulses: Normal pulses.  Pulmonary:     Effort: Pulmonary effort is normal. No respiratory distress.  Abdominal:     General: There is no distension.     Palpations: Abdomen is soft.  Musculoskeletal:        General: Tenderness present.     Cervical back: Neck supple.     Right lower leg: No edema.     Left lower leg: No edema.     Comments: Patient has good distal strength with no pain over the greater trochanters.  No clonus or focal weakness.  Skin:  Findings: No erythema, lesion or rash.  Neurological:     General: No focal deficit present.     Mental Status: She is alert and oriented to person, place, and time.     Sensory: No sensory deficit.     Motor: No weakness or abnormal muscle tone.     Coordination: Coordination normal.  Psychiatric:        Mood and Affect: Mood normal.        Behavior: Behavior normal.      Imaging: No results found.

## 2020-08-29 NOTE — Procedures (Signed)
Lumbosacral Transforaminal Epidural Steroid Injection - Sub-Pedicular Approach with Fluoroscopic Guidance  Patient: Erin [name]      Date of Birth: 1960/07/16 MRN: [id number] PCP: Emeterio Reeve, DO      Visit Date: 08/29/2020   Universal Protocol:    Date/Time: 08/29/2020  Consent Given By: the patient  Position: PRONE  Additional Comments: Vital signs were monitored before and after the procedure. Patient was prepped and draped in the usual sterile fashion. The correct patient, procedure, and site was verified.   Injection Procedure Details:   Procedure diagnoses: Lumbar radiculopathy [M54.16]    Meds Administered:  Meds ordered this encounter  Medications  . methylPREDNISolone acetate (DEPO-MEDROL) injection 80 mg    Laterality: Bilateral  Location/Site:  L5-S1  Needle:5.0 in., 22 ga.  Short bevel or Quincke spinal needle  Needle Placement: Transforaminal  Findings:    -Comments: Excellent flow of contrast along the nerve, nerve root and into the epidural space.  Patient really displaying hyperesthesia with any kind of injection in the scanner needling movement but did okay we were able to get good flow of contrast.  Procedure Details: After squaring off the end-plates to get a true AP view, the C-arm was positioned so that an oblique view of the foramen as noted above was visualized. The target area is just inferior to the "nose of the scotty dog" or sub pedicular. The soft tissues overlying this structure were infiltrated with 2-3 ml. of 1% Lidocaine without Epinephrine.  The spinal needle was inserted toward the target using a "trajectory" view along the fluoroscope beam.  Under AP and lateral visualization, the needle was advanced so it did not puncture dura and was located close the 6 O'Clock position of the pedical in AP tracterory. Biplanar projections were used to confirm position. Aspiration was confirmed to be negative for CSF and/or blood. A 1-2 ml.  volume of Isovue-250 was injected and flow of contrast was noted at each level. Radiographs were obtained for documentation purposes.   After attaining the desired flow of contrast documented above, a 0.5 to 1.0 ml test dose of 0.25% Marcaine was injected into each respective transforaminal space.  The patient was observed for 90 seconds post injection.  After no sensory deficits were reported, and normal lower extremity motor function was noted,   the above injectate was administered so that equal amounts of the injectate were placed at each foramen (level) into the transforaminal epidural space.   Additional Comments:  No complications occurred Dressing: 2 x 2 sterile gauze and Band-Aid    Post-procedure details: Patient was observed during the procedure. Post-procedure instructions were reviewed.  Patient left the clinic in stable condition.

## 2020-08-31 ENCOUNTER — Ambulatory Visit: Payer: Medicare Other

## 2020-09-02 ENCOUNTER — Telehealth: Payer: Self-pay | Admitting: Osteopathic Medicine

## 2020-09-02 NOTE — Progress Notes (Signed)
  Chronic Care Management   Outreach Note  09/02/2020 Name: Icelyn Navarrete MRN: [id number] DOB: September 15, 1960  Referred by: Emeterio Reeve, DO Reason for referral : No chief complaint on file.   An unsuccessful telephone outreach was attempted today. The patient was referred to the pharmacist for assistance with care management and care coordination.   Follow Up Plan:   Lauretta Grill Upstream Scheduler

## 2020-09-14 ENCOUNTER — Other Ambulatory Visit: Payer: Self-pay | Admitting: Osteopathic Medicine

## 2020-09-21 ENCOUNTER — Telehealth: Payer: Self-pay | Admitting: Osteopathic Medicine

## 2020-09-21 NOTE — Progress Notes (Signed)
  Chronic Care Management   Outreach Note  09/21/2020 Name: Erin [name] MRN: [id number] DOB: 1961-04-14  Referred by: Emeterio Reeve, DO Reason for referral : No chief complaint on file.   A second unsuccessful telephone outreach was attempted today. The patient was referred to pharmacist for assistance with care management and care coordination.  Follow Up Plan:   Lauretta Grill Upstream Scheduler

## 2020-10-05 ENCOUNTER — Other Ambulatory Visit: Payer: Self-pay

## 2020-10-05 DIAGNOSIS — M79609 Pain in unspecified limb: Secondary | ICD-10-CM

## 2020-10-05 DIAGNOSIS — M79605 Pain in left leg: Secondary | ICD-10-CM

## 2020-10-05 MED ORDER — DICLOFENAC SODIUM 1 % EX GEL: CUTANEOUS | 11 refills | Status: DC

## 2020-10-11 ENCOUNTER — Telehealth: Payer: Self-pay | Admitting: Osteopathic Medicine

## 2020-10-11 NOTE — Progress Notes (Signed)
  Chronic Care Management   Outreach Note  10/11/2020 Name: Erin [name] MRN: [id number] DOB: 11-21-1960  Referred by: Emeterio Reeve, DO Reason for referral : No chief complaint on file.   Third unsuccessful telephone outreach was attempted today. The patient was referred to the pharmacist for assistance with care management and care coordination.   Follow Up Plan:   Lauretta Grill Upstream Scheduler

## 2020-11-18 ENCOUNTER — Other Ambulatory Visit: Payer: Self-pay | Admitting: Pulmonary Disease

## 2020-11-18 DIAGNOSIS — J449 Chronic obstructive pulmonary disease, unspecified: Secondary | ICD-10-CM

## 2020-11-18 DIAGNOSIS — J431 Panlobular emphysema: Secondary | ICD-10-CM

## 2020-11-25 ENCOUNTER — Ambulatory Visit (INDEPENDENT_AMBULATORY_CARE_PROVIDER_SITE_OTHER): Payer: Medicare Other | Admitting: Pulmonary Disease

## 2020-11-25 ENCOUNTER — Other Ambulatory Visit: Payer: Self-pay

## 2020-11-25 ENCOUNTER — Encounter: Payer: Self-pay | Admitting: Pulmonary Disease

## 2020-11-25 VITALS — BP 122/80 | HR 64 | Temp 97.8°F | Ht 67.0 in | Wt 148.4 lb

## 2020-11-25 DIAGNOSIS — J431 Panlobular emphysema: Secondary | ICD-10-CM | POA: Diagnosis not present

## 2020-11-25 DIAGNOSIS — R911 Solitary pulmonary nodule: Secondary | ICD-10-CM | POA: Diagnosis not present

## 2020-11-25 DIAGNOSIS — Z720 Tobacco use: Secondary | ICD-10-CM

## 2020-11-25 NOTE — Patient Instructions (Addendum)
COPD with panlobular emphysema-GOLD group A.  No exacerbations >24 months --CONTINUE Breztri TWO puffs with spacer --CONTINUE Albuterol as needed for shortness of breath or wheezing --SCHEDULE pulmonary function test prior to next visit --ENCOURAGE regular activity including aerobic activity  Subcentimeter pulmonary nodules -Annual CT scan  Follow-up with me in 3 months

## 2020-11-25 NOTE — Progress Notes (Signed)
Synopsis: Referred in October 2020 for COPD by Emeterio Reeve, DO.   Subjective:   PATIENT ID: Erin [name] GENDER: female DOB: 01/01/1961, MRN: [id number]  Chief Complaint  Patient presents with   Follow-up    Still smoking having sob.    Erin [name] is a 60 year old female active smoker with COPD, hx of cholangiocarcinoma with liver involvement s/p cholecystectomy and partial liver resection.   Synopsis: She was previously followed by Dr. Carlis Abbott since 01/2019. She established care with me in 07/2020. She is compliant on Breztri. Rinses her mouth with each use but concerned about getting thrush. Also states inhaler has metallic taste. At baseline, she reports heavy breathing at rest and exertion. Has occasional cough and wheeze during the day. No nighttime symptoms. She has no limitations in activity with her symptoms. She remains an active smoker and continues to due to stress with her family including her children and grandchildren. She is not interested in quitting smoking. She expresses concern about not receiving her CT lung results. Denies recent exacerbations needing steroids or antibiotics.  11/25/20 She reports that she is having shortness of breath at baseline. She has wheezing with activity. She has social stressors with her daughter and grandson. She currently smokes 1ppd. She is compliant with her Breztri.   Social Hx: Currently smokes 1 ppd. 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 Dx of bi-polar depression in EPIC   Headache(784.0)     migraines   Hot flashes, menopausal    HPV (human papilloma virus) infection    Lung nodule, multiple    Osteopenia    Seizures ([place])    unknown reason for seizures- last seizurein Sept 2012   TIA (transient ischemic attack)      Allergies  Allergen Reactions   Bee Venom Anaphylaxis   Butalbital-Apap-Caffeine Other (See Comments)    Abd. pain   Singulair  [Montelukast Sodium] Other (See Comments)    "Sinus infection for months"   Tylenol [Acetaminophen] Other (See Comments)    Can not take due to patient's liver cancer and resection.   Amoxicillin Diarrhea    Severe diarrhea   Lyrica [Pregabalin] Itching     Immunization History  Administered Date(s) Administered   Influenza,inj,Quad PF,6+ Mos 02/03/2019, 01/01/2020   PFIZER(Purple Top)SARS-COV-2 Vaccination 07/16/2019, 08/10/2019, 01/16/2020   Pneumococcal Polysaccharide-23 02/03/2015   Tdap 05/08/2010   Zoster, Live 01/04/2014    Outpatient Medications Prior to Visit  Medication Sig Dispense Refill   albuterol (VENTOLIN HFA) 108 (90 Base) MCG/ACT inhaler INHALE 2 PUFFS INTO THE LUNGS EVERY 6 HOURS AS NEEDED FOR WHEEZING OR SHORTNESS OF BREATH 6.7 g 3   BELSOMRA 20 MG TABS TAKE 1 TABLET BY MOUTH AT BEDTIME 90 tablet 1   Budeson-Glycopyrrol-Formoterol (BREZTRI AEROSPHERE) 160-9-4.8 MCG/ACT AERO Inhale 2 puffs into the lungs 2 (two) times daily. 10.7 g 3   DULoxetine (CYMBALTA) 30 MG capsule Take 30 mg by mouth daily.     diclofenac  Sodium (VOLTAREN) 1 % GEL Apply 4 g topically 4 (four) times daily. To affected joint. 100 g 11   fluticasone (FLONASE) 50 MCG/ACT nasal spray Place 2 sprays into both nostrils daily. (Patient not taking: Reported on 11/25/2020) 48 g 3   loratadine (CLARITIN) 10 MG tablet Take 1 tablet (10 mg total) by mouth daily. 90 tablet 3   meloxicam (MOBIC) 7.5 MG tablet Take 1 tablet (7.5 mg total) by mouth 2 (two) times daily as needed for pain. 30 tablet 2   No facility-administered medications prior to  visit.    Review of Systems  Constitutional:  Negative for chills, diaphoresis, fever, malaise/fatigue and weight loss.  HENT:  Negative for congestion.   Respiratory:  Positive for shortness of breath and wheezing. Negative for cough, hemoptysis and sputum production.   Cardiovascular:  Negative for chest pain, palpitations and leg swelling.    Objective:   Vitals:   11/25/20 0903  BP: 122/80  Pulse: 64  Temp: 97.8 F (36.6 C)  TempSrc: Oral  SpO2: 97%  Weight: 148 lb 6.4 oz (67.3 kg)  Height: '5\' 7"'  (1.702 m)   Physical Exam: General: Well-appearing, no acute distress HENT: [place], AT Eyes: EOMI, no scleral icterus Respiratory: Clear to auscultation bilaterally.  No crackles, wheezing or rales Cardiovascular: RRR, -M/R/G, no JVD Extremities:-Edema,-tenderness Neuro: AAO x4, CNII-XII grossly intact Psych: Normal mood, normal affect  Chest Imaging- films reviewed: CT C-spine 08/04/2015- lung images reviewed- panlobular emphysema, apical scarring  CT chest 03/11/2018-report reviewed-emphysema, apical scarring, small lung nodules, the largest being 5 mm in diameter.  No mediastinal or hilar adenopathy.  Postsurgical changes in the right hepatic lobe and small hypodensities in the left hepatic lobe-stable.  Per CT report as far back as May 2014 there has been a right upper lobe 5 mm nodule.  CT chest 02/24/2019- severe centrilobular emphysema.  Nodule in RUL, RML, lingula, LLL.  No significant mediastinal or hilar adenopathy.  4 cm ascending thoracic aortic aneurysm.  CT Chest 04/29/20 - moderate centrilobular emphysema. Stable 86m LUL and LLL.  CT Chest Lung Screen 08/24/20 - moderate bullous and centrilobular emphysema.   Pulmonary Functions Testing Results: No flowsheet data found.    [facility](no standards available for comparison of FEV1 and FVC)- both read as moderate obstruction 10/20/2018: FVC 3.29 L FEV1 1.89 L Ratio 57%  04/21/2018: FVC 2.85  L FEV1 1.57 L Ratio 55%  03/17/2018: FVC 2.37 L FEV1 1.25 L Ratio 53%  10/30/2016: 6-minute walk test-97% on room air at rest, desaturated to 90% on room air during ambulation and recovered to 93% on room air by the end of the test.  Ambulated 660 feet.  [facility]- [facility]angiocarcinoma  Echocardiogram 08/03/2016: Outside report reviewed LVEF 60 to 65%, no regional wall motion abnormalities, normal diastolic function.  Normal LA, RV, RA.  Mild AR, trace MR    Assessment & Plan:   COPD with panlobular emphysema-GOLD group A.  No exacerbations >24 months. Discussed bronchodilator management and evaluation of severity of COPD with PFTs --CONTINUE Breztri TWO puffs with spacer --CONTINUE Albuterol as needed for shortness of breath or wheezing --SCHEDULE pulmonary function test prior to next visit --ENCOURAGE regular activity including aerobic activity  Tobacco abuse Patient is an active smoker. We discussed smoking cessation for 3 minutes. We discussed triggers and stressors and ways to deal with them. We discussed barriers to continued smoking and benefits of smoking cessation. Provided patient with information  cessation techniques and interventions including [place] quitline.  Subcentimeter pulmonary nodules-high risk given ongoing tobacco abuse and history of cholangiocarcinoma. We reviewed CT chest report and discussed stability of left lung nodules over two years, unlikely to progress to lung cancer at this point. -Annual CT scan  Health Maintenance Immunization History  Administered Date(s) Administered   Influenza,inj,Quad PF,6+ Mos 02/03/2019, 01/01/2020   PFIZER(Purple Top)SARS-COV-2 Vaccination 07/16/2019, 08/10/2019, 01/16/2020   Pneumococcal Polysaccharide-23 02/03/2015   Tdap 05/08/2010   Zoster, Live 01/04/2014   No orders of the defined types were placed in this encounter.  No orders of the defined types were placed in this  encounter.   Return in about 3 months (around 02/25/2021).   I have spent a total time of 35-minutes on the day of the appointment reviewing prior documentation, coordinating care and discussing medical diagnosis and plan with the patient/family. Past medical history, allergies, medications were reviewed. Pertinent imaging, labs and tests included in this note have been reviewed and interpreted independently by me.    Erin [name] Rodman Pickle, MD [facility] 11/25/2020 9:02 AM

## 2020-11-28 ENCOUNTER — Other Ambulatory Visit: Payer: Self-pay

## 2020-11-28 ENCOUNTER — Ambulatory Visit (INDEPENDENT_AMBULATORY_CARE_PROVIDER_SITE_OTHER): Payer: Medicare Other | Admitting: Osteopathic Medicine

## 2020-11-28 DIAGNOSIS — Z0000 Encounter for general adult medical examination without abnormal findings: Secondary | ICD-10-CM

## 2020-11-28 DIAGNOSIS — Z1231 Encounter for screening mammogram for malignant neoplasm of breast: Secondary | ICD-10-CM | POA: Diagnosis not present

## 2020-11-28 NOTE — Patient Instructions (Addendum)
[place] Maintenance Summary and Written Plan of Care  Ms. Erin [name] ,  Thank you for allowing me to perform your Medicare Annual Wellness Visit and for your ongoing commitment to your health.   Health Maintenance & Immunization History Health Maintenance  Topic Date Due   COVID-19 Vaccine (4 - Booster for [place] series) 12/14/2020 (Originally 04/17/2020)   Hepatitis C Screening  12/31/2020 (Originally 05/25/1978)   HIV Screening  12/31/2020 (Originally 05/26/1975)   INFLUENZA VACCINE  01/14/2021 (Originally 11/14/2020)   Zoster Vaccines- Shingrix (1 of 2) 02/28/2021 (Originally 05/26/1979)   TETANUS/TDAP  05/31/2021 (Originally 05/08/2020)   Pneumococcal Vaccine 60-60 Years old (2 - PCV) 11/28/2021 (Originally 02/03/2016)   MAMMOGRAM  11/28/2021 (Originally 12/04/2013)   COLONOSCOPY (Pts 45-36yr Insurance coverage will need to be confirmed)  10/26/2028   HPV VACCINES  Aged Out   Immunization History  Administered Date(s) Administered   Influenza,inj,Quad PF,6+ Mos 02/03/2019, 01/01/2020   PFIZER(Purple Top)SARS-COV-2 Vaccination 07/16/2019, 08/10/2019, 01/16/2020   Pneumococcal Polysaccharide-23 02/03/2015   Tdap 05/08/2010   Zoster, Live 01/04/2014    These are the patient goals that we discussed:  Goals Addressed               This Visit's Progress     Patient Stated (pt-stated)        11/28/2020 AWV Goal: Exercise for General Health  Patient will verbalize understanding of the benefits of increased physical activity: Exercising regularly is important. It will improve your overall fitness, flexibility, and endurance. Regular exercise also will improve your overall health. It can help you control your weight, reduce stress, and improve your bone density. Over the next year, patient will increase physical activity as tolerated with a goal of at least 150 minutes of moderate physical activity per week.  You can tell that you are exercising at a moderate  intensity if your heart starts beating faster and you start breathing faster but can still hold a conversation. Moderate-intensity exercise ideas include: Walking 1 mile (1.6 km) in about 15 minutes Biking Hiking Golfing Dancing Water aerobics Patient will verbalize understanding of everyday activities that increase physical activity by providing examples like the following: Yard work, such as: PSales promotion account executiveGardening Washing windows or floors Patient will be able to explain general safety guidelines for exercising:  Before you start a new exercise program, talk with your health care provider. Do not exercise so much that you hurt yourself, feel dizzy, or get very short of breath. Wear comfortable clothes and wear shoes with good support. Drink plenty of water while you exercise to prevent dehydration or heat stroke. Work out until your breathing and your heartbeat get faster.          This is a list of Health Maintenance Items that are overdue or due now: Pneumococcal vaccine  Influenza vaccine Td vaccine Screening mammography  Orders/Referrals Placed Today: Orders Placed This Encounter  Procedures   Mammogram 3D SCREEN BREAST BILATERAL    Standing Status:   Future    Standing Expiration Date:   11/28/2021    Scheduling Instructions:     Please call patient to schedule.    Order Specific Question:   Reason for Exam (SYMPTOM  OR DIAGNOSIS REQUIRED)    Answer:   Breast cancer screening    Order Specific Question:   Is the patient pregnant?    Answer:   No  Order Specific Question:   Preferred imaging location?    Answer:   [facility]    (Contact our referral department at [phone] if you have not spoken with someone about your referral appointment within the next 5 days)    Follow-up Plan Follow-up with Emeterio Reeve, DO as planned Schedule your flu and  pneumonia vaccine at the office. Shingles vaccine, Covid booster and tetanus/tdap vaccine can be done at the pharmacy.  Referral for mammogram has been sent and they will call you to schedule.  Medicare wellness in one year.  AVS printed and mailed to the patient.      Health Maintenance, Female Adopting a healthy lifestyle and getting preventive care are important in promoting health and wellness. Ask your health care provider about: The right schedule for you to have regular tests and exams. Things you can do on your own to prevent diseases and keep yourself healthy. What should I know about diet, weight, and exercise? Eat a healthy diet  Eat a diet that includes plenty of vegetables, fruits, low-fat dairy products, and lean protein. Do not eat a lot of foods that are high in solid fats, added sugars, or sodium.  Maintain a healthy weight Body mass index (BMI) is used to identify weight problems. It estimates body fat based on height and weight. Your health care provider can help determineyour BMI and help you achieve or maintain a healthy weight. Get regular exercise Get regular exercise. This is one of the most important things you can do for your health. Most adults should: Exercise for at least 150 minutes each week. The exercise should increase your heart rate and make you sweat (moderate-intensity exercise). Do strengthening exercises at least twice a week. This is in addition to the moderate-intensity exercise. Spend less time sitting. Even light physical activity can be beneficial. Watch cholesterol and blood lipids Have your blood tested for lipids and cholesterol at 60 years of age, then havethis test every 5 years. Have your cholesterol levels checked more often if: Your lipid or cholesterol levels are high. You are older than 60 years of age. You are at high risk for heart disease. What should I know about cancer screening? Depending on your health history and family  history, you may need to have cancer screening at various ages. This may include screening for: Breast cancer. Cervical cancer. Colorectal cancer. Skin cancer. Lung cancer. What should I know about heart disease, diabetes, and high blood pressure? Blood pressure and heart disease High blood pressure causes heart disease and increases the risk of stroke. This is more likely to develop in people who have high blood pressure readings, are of African descent, or are overweight. Have your blood pressure checked: Every 3-5 years if you are 14-2 years of age. Every year if you are 35 years old or older. Diabetes Have regular diabetes screenings. This checks your fasting blood sugar level. Have the screening done: Once every three years after age 21 if you are at a normal weight and have a low risk for diabetes. More often and at a younger age if you are overweight or have a high risk for diabetes. What should I know about preventing infection? Hepatitis B If you have a higher risk for hepatitis B, you should be screened for this virus. Talk with your health care provider to find out if you are at risk forhepatitis B infection. Hepatitis C Testing is recommended for: Everyone born from [birth year] through 1965. Anyone with known risk  factors for hepatitis C. Sexually transmitted infections (STIs) Get screened for STIs, including gonorrhea and chlamydia, if: You are sexually active and are younger than 60 years of age. You are older than 60 years of age and your health care provider tells you that you are at risk for this type of infection. Your sexual activity has changed since you were last screened, and you are at increased risk for chlamydia or gonorrhea. Ask your health care provider if you are at risk. Ask your health care provider about whether you are at high risk for HIV. Your health care provider may recommend a prescription medicine to help prevent HIV infection. If you choose to take  medicine to prevent HIV, you should first get tested for HIV. You should then be tested every 3 months for as long as you are taking the medicine. Pregnancy If you are about to stop having your period (premenopausal) and you may become pregnant, seek counseling before you get pregnant. Take 400 to 800 micrograms (mcg) of folic acid every day if you become pregnant. Ask for birth control (contraception) if you want to prevent pregnancy. Osteoporosis and menopause Osteoporosis is a disease in which the bones lose minerals and strength with aging. This can result in bone fractures. If you are 37 years old or older, or if you are at risk for osteoporosis and fractures, ask your health care provider if you should: Be screened for bone loss. Take a calcium or vitamin D supplement to lower your risk of fractures. Be given hormone replacement therapy (HRT) to treat symptoms of menopause. Follow these instructions at home: Lifestyle Do not use any products that contain nicotine or tobacco, such as cigarettes, e-cigarettes, and chewing tobacco. If you need help quitting, ask your health care provider. Do not use street drugs. Do not share needles. Ask your health care provider for help if you need support or information about quitting drugs. Alcohol use Do not drink alcohol if: Your health care provider tells you not to drink. You are pregnant, may be pregnant, or are planning to become pregnant. If you drink alcohol: Limit how much you use to 0-1 drink a day. Limit intake if you are breastfeeding. Be aware of how much alcohol is in your drink. In the [place] one drink equals one 12 oz bottle of beer (355 mL), one 5 oz glass of wine (148 mL), or one 1 oz glass of hard liquor (44 mL). General instructions Schedule regular health, dental, and eye exams. Stay current with your vaccines. Tell your health care provider if: You often feel depressed. You have ever been abused or do not feel safe at  home. Summary Adopting a healthy lifestyle and getting preventive care are important in promoting health and wellness. Follow your health care provider's instructions about healthy diet, exercising, and getting tested or screened for diseases. Follow your health care provider's instructions on monitoring your cholesterol and blood pressure. This information is not intended to replace advice given to you by your health care provider. Make sure you discuss any questions you have with your healthcare provider. Document Revised: 03/26/2018 Document Reviewed: 03/26/2018 Elsevier Patient Education  2022 [employer].
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
## 2020-12-07 ENCOUNTER — Other Ambulatory Visit: Payer: Self-pay

## 2020-12-07 MED ORDER — DULOXETINE HCL 30 MG PO CPEP: 30.0000 mg | ORAL_CAPSULE | Freq: Every day | ORAL | 1 refills | Status: DC

## 2020-12-07 MED ORDER — BELSOMRA 20 MG PO TABS: 1.0000 | ORAL_TABLET | Freq: Every day | ORAL | 1 refills | Status: DC

## 2020-12-07 NOTE — Telephone Encounter (Signed)
[facility] is requesting refills for duloxetine (written by historical provider) and belsomra. Rxs pended.

## 2020-12-15 ENCOUNTER — Other Ambulatory Visit: Payer: Self-pay | Admitting: *Deleted

## 2020-12-15 DIAGNOSIS — J449 Chronic obstructive pulmonary disease, unspecified: Secondary | ICD-10-CM

## 2020-12-15 DIAGNOSIS — J431 Panlobular emphysema: Secondary | ICD-10-CM

## 2020-12-15 MED ORDER — ALBUTEROL SULFATE HFA 108 (90 BASE) MCG/ACT IN AERS: 2.0000 | INHALATION_SPRAY | Freq: Four times a day (QID) | RESPIRATORY_TRACT | 3 refills | Status: DC | PRN

## 2020-12-21 ENCOUNTER — Encounter: Payer: Self-pay | Admitting: Osteopathic Medicine

## 2020-12-21 ENCOUNTER — Ambulatory Visit: Payer: Medicare Other | Admitting: Osteopathic Medicine

## 2020-12-21 ENCOUNTER — Telehealth (INDEPENDENT_AMBULATORY_CARE_PROVIDER_SITE_OTHER): Payer: Medicare Other | Admitting: Osteopathic Medicine

## 2020-12-21 VITALS — Wt 155.0 lb

## 2020-12-21 DIAGNOSIS — R202 Paresthesia of skin: Secondary | ICD-10-CM | POA: Diagnosis not present

## 2020-12-21 DIAGNOSIS — Z780 Asymptomatic menopausal state: Secondary | ICD-10-CM

## 2020-12-21 DIAGNOSIS — E7801 Familial hypercholesterolemia: Secondary | ICD-10-CM

## 2020-12-21 DIAGNOSIS — M797 Fibromyalgia: Secondary | ICD-10-CM

## 2020-12-21 NOTE — Progress Notes (Signed)
Telemedicine Visit via  Audio only - telephone (patient preference /  technical difficulty with MyChart video application)  I connected with Erin [name] on 12/21/20 at 10:41 AM  by phone or  telemedicine application as noted above  I verified that I am speaking with or regarding  the correct patient using two identifiers.  Participants: Myself, Dr Emeterio Reeve DO Patient: Erin [name] Patient proxy if applicable: none Other, if applicable: none  Patient is at home I am in office at [facility]    I discussed the limitations of evaluation and management  by telemedicine and the availability of in person appointments.  The participant(s) above expressed understanding and  agreed to proceed with this appointment via telemedicine.       History of Present Illness: Erin [name] is a 60 y.o. female who would like to discuss upper body shaking, diarrhea. Woke up today with a headache. Ongoing about 2 weeks at this point. Every morning feels like sweaty and shaky in her upper body, feel like stomach upset, occasionally will have diarrhea with this. This morning woke up with a strange headache, didn't last long, felt hard to describe, resolved on its own. The "weird feeling" is gone after a minute or two. Denies anxiety. Will come on at random, resolve within a few minutes on its own.   Reports occasional rapid heart beat w/ these but not every time.   One time took BP w/ these episode and was 135/75 and HR 65.   Denies dizziness/presyncope. Denies SOB/CP, denies vision changes      Observations/Objective:  Wt 155 lb (70.3 kg)   BMI 24.28 kg/m   BP Readings from Last 3 Encounters:  11/25/20 122/80  08/29/20 (!) 149/74  08/09/20 116/72   Wt Readings from Last 3 Encounters:  12/21/20 155 lb (70.3 kg)  11/25/20 148 lb 6.4 oz (67.3 kg)  08/09/20 135 lb (61.2 kg)    Exam: Normal Speech.  NAD  Lab and Radiology Results No results found for this or  any previous visit (from the past 72 hour(s)). No results found.     Assessment and Plan: 60 y.o. female with The primary encounter diagnosis was Paresthesia. Diagnoses of Fibromyalgia and Menopause were also pertinent to this visit.  Uncertain cause for symptoms If all labs normal will need to come in to office for further discussion    PDMP not reviewed this encounter. No orders of the defined types were placed in this encounter.  No orders of the defined types were placed in this encounter.  There are no Patient Instructions on file for this visit.  Instructions sent via MyChart.   Follow Up Instructions: No follow-ups on file.    I discussed the assessment and treatment plan with the patient. The patient was provided an opportunity to ask questions and all were answered. The patient agreed with the plan and demonstrated an understanding of the instructions.   The patient was advised to call back or seek an in-person evaluation if any new concerns, if symptoms worsen or if the condition fails to improve as anticipated.  21 minutes of non-face-to-face time was provided during this encounter.      . . . . . . . . . . . . . Marland Kitchen                   Historical information moved to improve visibility of documentation.  Past Medical History:  Diagnosis Date   Anxiety  Arthritis    hands and feet   ASCUS (atypical squamous cells of undetermined significance) on Pap smear    Blood transfusion 1983   In [place]   BRCA2 gene mutation positive 2014   Bursitis    Cancer ([place])    liver   Chronic constipation    Condyloma    COPD (chronic obstructive pulmonary disease) (HCC)    Depression    per pt h/o depression- no meds currently. Dx of bi-polar depression in EPIC   Headache(784.0)    migraines   Hot flashes, menopausal    HPV (human papilloma virus) infection    Lung nodule, multiple    Osteopenia    Seizures ([place])    unknown reason for  seizures- last seizurein Sept 2012   TIA (transient ischemic attack)    Past Surgical History:  Procedure Laterality Date   ABDOMINOPLASTY     LAPAROSCOPY  01/04/2011   Procedure: LAPAROSCOPY OPERATIVE;  Surgeon: Juliene Pina C. Hulan Fray, MD;  Location: [facility];  Service: Gynecology;  Laterality: N/A;   LAPAROSCOPY  06/14   gall bladder and 1/2 liver   SALPINGOOPHORECTOMY  01/04/2011   Procedure: SALPINGO OOPHERECTOMY;  Surgeon: Myra C. Hulan Fray, MD;  Location: [facility];  Service: Gynecology;  Laterality: Bilateral;   TOTAL VAGINAL HYSTERECTOMY  60 yrs old   Social History   Tobacco Use   Smoking status: Every Day    Packs/day: 1.00    Years: 40.00    Pack years: 40.00    Types: Cigarettes   Smokeless tobacco: Never   Tobacco comments:    0.5 pack a day  Substance Use Topics   Alcohol use: No    Comment: One beer two weeks ago.   family history includes Alcohol abuse in her daughter; Breast cancer in her daughter, maternal aunt, and mother; Depression in her mother; Drug abuse in her father, mother, and sister; Lung cancer in her father.  Medications: Current Outpatient Medications  Medication Sig Dispense Refill   albuterol (VENTOLIN HFA) 108 (90 Base) MCG/ACT inhaler Inhale 2 puffs into the lungs every 6 (six) hours as needed for wheezing or shortness of breath. 24 g 3   DULoxetine (CYMBALTA) 30 MG capsule Take 1 capsule (30 mg total) by mouth daily. 90 capsule 1   No current facility-administered medications for this visit.   Allergies  Allergen Reactions   Bee Venom Anaphylaxis   Butalbital-Apap-Caffeine Other (See Comments)    Abd. pain   Singulair  [Montelukast Sodium] Other (See Comments)    "Sinus infection for months"   Tylenol [Acetaminophen] Other (See Comments)    Can not take due to patient's liver cancer and resection.   Amoxicillin Diarrhea    Severe diarrhea   Lyrica [Pregabalin] Itching     If phone visit, billing and coding can please add appropriate modifier if  needed

## 2020-12-22 MED ORDER — BELSOMRA 20 MG PO TABS: 1.0000 | ORAL_TABLET | Freq: Every day | ORAL | 1 refills | Status: DC

## 2020-12-24 LAB — COMPLETE METABOLIC PANEL WITH GFR
AG Ratio: 2 (calc) (ref 1.0–2.5)
ALT: 18 U/L (ref 6–29)
AST: 19 U/L (ref 10–35)
Albumin: 4.5 g/dL (ref 3.6–5.1)
Alkaline phosphatase (APISO): 79 U/L (ref 37–153)
BUN: 12 mg/dL (ref 7–25)
CO2: 26 mmol/L (ref 20–32)
Calcium: 9.5 mg/dL (ref 8.6–10.4)
Chloride: 105 mmol/L (ref 98–110)
Creat: 0.79 mg/dL (ref 0.50–1.05)
Globulin: 2.2 g/dL (calc) (ref 1.9–3.7)
Glucose, Bld: 87 mg/dL (ref 65–139)
Potassium: 4.1 mmol/L (ref 3.5–5.3)
Sodium: 141 mmol/L (ref 135–146)
Total Bilirubin: 0.5 mg/dL (ref 0.2–1.2)
Total Protein: 6.7 g/dL (ref 6.1–8.1)
eGFR: 86 mL/min/{1.73_m2} (ref >=60)

## 2020-12-24 LAB — LIPID PANEL
Cholesterol: 255 mg/dL — ABNORMAL HIGH (ref <200)
HDL: 48 mg/dL — ABNORMAL LOW (ref >=50)
LDL Cholesterol (Calc): 176 mg/dL (calc) — ABNORMAL HIGH
Non-HDL Cholesterol (Calc): 207 mg/dL (calc) — ABNORMAL HIGH (ref <130)
Total CHOL/HDL Ratio: 5.3 (calc) — ABNORMAL HIGH (ref <5.0)
Triglycerides: 160 mg/dL — ABNORMAL HIGH (ref <150)

## 2020-12-24 LAB — CBC WITH DIFFERENTIAL/PLATELET
Absolute Monocytes: 312 cells/uL (ref 200–950)
Basophils Absolute: 42 cells/uL (ref 0–200)
Basophils Relative: 0.8 %
Eosinophils Absolute: 73 cells/uL (ref 15–500)
Eosinophils Relative: 1.4 %
HCT: 40.3 % (ref 35.0–45.0)
Hemoglobin: 13.6 g/dL (ref 11.7–15.5)
Lymphs Abs: 1950 cells/uL (ref 850–3900)
MCH: 31.6 pg (ref 27.0–33.0)
MCHC: 33.7 g/dL (ref 32.0–36.0)
MCV: 93.7 fL (ref 80.0–100.0)
MPV: 11.3 fL (ref 7.5–12.5)
Monocytes Relative: 6 %
Neutro Abs: 2824 cells/uL (ref 1500–7800)
Neutrophils Relative %: 54.3 %
Platelets: 261 10*3/uL (ref 140–400)
RBC: 4.3 10*6/uL (ref 3.80–5.10)
RDW: 11.8 % (ref 11.0–15.0)
Total Lymphocyte: 37.5 %
WBC: 5.2 10*3/uL (ref 3.8–10.8)

## 2020-12-24 LAB — VITAMIN D 25 HYDROXY (VIT D DEFICIENCY, FRACTURES): Vit D, 25-Hydroxy: 26 ng/mL — ABNORMAL LOW (ref 30–100)

## 2020-12-24 LAB — TSH: TSH: 2.13 mIU/L (ref 0.40–4.50)

## 2020-12-24 LAB — MAGNESIUM: Magnesium: 2 mg/dL (ref 1.5–2.5)

## 2020-12-24 LAB — VITAMIN B12: Vitamin B-12: 266 pg/mL (ref 200–1100)

## 2020-12-25 DIAGNOSIS — R911 Solitary pulmonary nodule: Secondary | ICD-10-CM | POA: Insufficient documentation

## 2020-12-25 DIAGNOSIS — Z720 Tobacco use: Secondary | ICD-10-CM | POA: Insufficient documentation

## 2020-12-29 ENCOUNTER — Telehealth: Payer: Medicare Other | Admitting: Osteopathic Medicine

## 2020-12-30 DIAGNOSIS — E78019 Familial hypercholesterolemia, unspecified: Secondary | ICD-10-CM | POA: Insufficient documentation

## 2020-12-30 DIAGNOSIS — E7801 Familial hypercholesterolemia: Secondary | ICD-10-CM | POA: Insufficient documentation

## 2020-12-30 MED ORDER — ATORVASTATIN CALCIUM 20 MG PO TABS: 20.0000 mg | ORAL_TABLET | Freq: Every day | ORAL | 3 refills | Status: DC

## 2020-12-30 NOTE — Addendum Note (Signed)
Addended by: Maryla Morrow on: 12/30/2020 12:57 PM   Modules accepted: Orders

## 2021-01-01 ENCOUNTER — Telehealth: Payer: Self-pay | Admitting: *Deleted

## 2021-01-01 MED ORDER — ALBUTEROL SULFATE HFA 108 (90 BASE) MCG/ACT IN AERS: 1.0000 | INHALATION_SPRAY | Freq: Four times a day (QID) | RESPIRATORY_TRACT | 3 refills | Status: DC | PRN

## 2021-01-01 NOTE — Telephone Encounter (Signed)
Recevied fax from [facility] that the  ventolin HFA inhaler is not covered by the insurance.   They will cover the generic Proair or Proventil HFA.   The proventil HFA has been sent in place of the ventolin HFa.

## 2021-01-06 ENCOUNTER — Emergency Department (INDEPENDENT_AMBULATORY_CARE_PROVIDER_SITE_OTHER)
Admission: EM | Admit: 2021-01-06 | Discharge: 2021-01-06 | Disposition: A | Discharge status: Home / Self Care | Payer: Medicare Other | Source: Home / Self Care

## 2021-01-06 ENCOUNTER — Other Ambulatory Visit: Payer: Self-pay

## 2021-01-06 ENCOUNTER — Emergency Department (INDEPENDENT_AMBULATORY_CARE_PROVIDER_SITE_OTHER): Payer: Medicare Other

## 2021-01-06 DIAGNOSIS — K439 Ventral hernia without obstruction or gangrene: Secondary | ICD-10-CM | POA: Diagnosis not present

## 2021-01-06 DIAGNOSIS — R1084 Generalized abdominal pain: Secondary | ICD-10-CM | POA: Diagnosis not present

## 2021-01-06 DIAGNOSIS — R1030 Lower abdominal pain, unspecified: Secondary | ICD-10-CM | POA: Diagnosis not present

## 2021-01-06 DIAGNOSIS — R1031 Right lower quadrant pain: Secondary | ICD-10-CM | POA: Diagnosis not present

## 2021-01-06 LAB — POCT URINALYSIS DIP (MANUAL ENTRY)
Bilirubin, UA: NEGATIVE
Glucose, UA: NEGATIVE mg/dL
Ketones, POC UA: NEGATIVE mg/dL
Leukocytes, UA: NEGATIVE
Nitrite, UA: NEGATIVE
Protein Ur, POC: NEGATIVE mg/dL
Spec Grav, UA: 1.015 (ref 1.010–1.025)
Urobilinogen, UA: 1 E.U./dL
pH, UA: 7 (ref 5.0–8.0)

## 2021-01-06 MED ORDER — ONDANSETRON 8 MG PO TBDP: 8.0000 mg | ORAL_TABLET | Freq: Three times a day (TID) | ORAL | 0 refills | Status: DC | PRN

## 2021-01-06 MED ORDER — ONDANSETRON 4 MG PO TBDP
4.0000 mg | ORAL_TABLET | Freq: Once | ORAL | Status: AC
  Administered 2021-01-06: 4 mg via ORAL
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
## 2021-01-06 NOTE — ED Triage Notes (Signed)
Pt presents with abdominal pain and heartburn x3 weeks.

## 2021-01-06 NOTE — Discharge Instructions (Addendum)
Advised/encouraged patient if abdominal pain worsens please go to [facility] ED for immediate evaluation and imaging.  Advised/encouraged patient if abdominal pain lessens it is not acute please return to clinic on Monday, 01/09/2021 for CT of abdomen and pelvis with contrast.

## 2021-01-08 LAB — URINE CULTURE
MICRO NUMBER:: 12415578
SPECIMEN QUALITY:: ADEQUATE

## 2021-01-09 ENCOUNTER — Emergency Department (INDEPENDENT_AMBULATORY_CARE_PROVIDER_SITE_OTHER): Payer: Medicare Other

## 2021-01-09 ENCOUNTER — Emergency Department (INDEPENDENT_AMBULATORY_CARE_PROVIDER_SITE_OTHER)
Admission: EM | Admit: 2021-01-09 | Discharge: 2021-01-09 | Disposition: A | Discharge status: Home / Self Care | Payer: Medicare Other | Source: Home / Self Care | Attending: Family Medicine | Admitting: Family Medicine

## 2021-01-09 DIAGNOSIS — R1010 Upper abdominal pain, unspecified: Secondary | ICD-10-CM

## 2021-01-09 DIAGNOSIS — K2900 Acute gastritis without bleeding: Secondary | ICD-10-CM

## 2021-01-09 MED ORDER — ONDANSETRON 4 MG PO TBDP
4.0000 mg | ORAL_TABLET | Freq: Once | ORAL | Status: AC
  Administered 2021-01-09: 4 mg via ORAL

## 2021-01-09 MED ORDER — ALUM & MAG HYDROXIDE-SIMETH 200-200-20 MG/5ML PO SUSP
30.0000 mL | Freq: Once | ORAL | Status: AC
  Administered 2021-01-09: 30 mL via ORAL

## 2021-01-09 MED ORDER — IOHEXOL 350 MG/ML SOLN
100.0000 mL | Freq: Once | INTRAVENOUS | Status: AC | PRN
  Administered 2021-01-09: 100 mL via INTRAVENOUS

## 2021-01-09 MED ORDER — OMEPRAZOLE 40 MG PO CPDR: 40.0000 mg | DELAYED_RELEASE_CAPSULE | Freq: Every day | ORAL | 0 refills | Status: DC

## 2021-01-09 MED ORDER — LIDOCAINE VISCOUS HCL 2 % MT SOLN
15.0000 mL | Freq: Once | OROMUCOSAL | Status: AC
  Administered 2021-01-09: 15 mL via ORAL

## 2021-01-09 MED ORDER — CIPROFLOXACIN HCL 500 MG PO TABS: 500.0000 mg | ORAL_TABLET | Freq: Two times a day (BID) | ORAL | 0 refills | Status: DC

## 2021-01-09 NOTE — ED Notes (Signed)
PIV removed, catheter intact. Dressing in place, clean dry and intact.

## 2021-01-09 NOTE — ED Notes (Signed)
20g PIV placed in Rt AC for CT. Pt tolerated well. One attempt by this nurse.

## 2021-01-09 NOTE — ED Triage Notes (Signed)
Pt presents with continued stomach pain and emesis. Pt was instructed during last visit to return for CT scan today due to her insurance.
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
## 2021-01-09 NOTE — Discharge Instructions (Addendum)
Take omeprazole once a day While at the pharmacy pick up some Mylanta (extra strength if they have it) to have for pain Make an appointment to see your primary care doctor in the next couple of weeks.  They may want to do additional testing regarding an ulcer  May continue Zofran as needed for nausea I have prescribed an antibiotic for your urinary infection

## 2021-02-02 ENCOUNTER — Telehealth (INDEPENDENT_AMBULATORY_CARE_PROVIDER_SITE_OTHER): Payer: Medicare Other | Admitting: Family Medicine

## 2021-02-02 ENCOUNTER — Encounter: Payer: Self-pay | Admitting: Family Medicine

## 2021-02-02 VITALS — BP 119/71 | HR 73 | Wt 155.0 lb

## 2021-02-02 DIAGNOSIS — F4329 Adjustment disorder with other symptoms: Secondary | ICD-10-CM

## 2021-02-02 DIAGNOSIS — K5909 Other constipation: Secondary | ICD-10-CM

## 2021-02-02 DIAGNOSIS — R112 Nausea with vomiting, unspecified: Secondary | ICD-10-CM | POA: Insufficient documentation

## 2021-02-02 DIAGNOSIS — K2100 Gastro-esophageal reflux disease with esophagitis, without bleeding: Secondary | ICD-10-CM

## 2021-02-02 NOTE — Progress Notes (Signed)
Virtual Visit via Video Note  I connected with Erin [name] on 02/03/21 at  4:20 PM EDT by a video enabled telemedicine application and verified that I am speaking with the correct person using two identifiers.   I discussed the limitations of evaluation and management by telemedicine and the availability of in person appointments. The patient expressed understanding and agreed to proceed.  Patient location: at home Provider location: in office  Subjective:    CC: stomach and vomiting  HPI:  Sick for about 5 weeks now with my stomach, it must be my nerves. I throw up almost every other day, for no reason. The nausea comes on so suddenly and I can't stop it or control it. No diarrhea associated. But she does have some constipation.  The omeprazole 40mg  maybe helps some but still have some dry heaving.  Helped her pain in her stomach and GERD but not the nausea. Says tried to go the bank and had to turn around and leave bc she was dry heaving.  She feels like some of this on stress.    I have been isolated since Covid. She has ben very stressed.    Pt would like a referral for therapy. Somewhere in [place].  She just has a lot going on with her children they have very strained relationships.  And she has not been able to establish a good relationship with some of her grandchildren because of that.  She is just to the point where she just feels overwhelmed with it all.   Past medical history, Surgical history, Family history not pertinant except as noted below, Social history, Allergies, and medications have been entered into the medical record, reviewed, and corrections made.    Objective:    General: Speaking clearly in complete sentences without any shortness of breath.  Alert and oriented x3.  Normal judgment. No apparent acute distress.    Impression and Recommendations:    Nausea and vomiting I do think she could have some component of cyclical vomiting which  could certainly be triggered by her stress but I still would really like her to consider seeing GI.  The PPI did help with her GERD and abdominal pain but has not helped with the vomiting.  GERD (gastroesophageal reflux disease) Recommend continue PPI since it has helped with her reflux and the abdominal pain.  CONSTIPATION, CHRONIC Recommend doing a cleanout with MiraLAX.  And then may be considering taking magnesium daily supplement over-the-counter she really wants to get off using laxatives frequently.  And she does feel like the Cymbalta has been helpful but has made her feel more constipated.  Stress and adjustment reaction We will go ahead and refer her to behavioral health she is very interested in talking with somebody she just feels like life is been really overwhelming and stressful and has had a lot of concerns and issues with her children in particular.    Orders Placed This Encounter  Procedures   Ambulatory referral to Behavioral Health    Referral Priority:   Routine    Referral Type:   Psychiatric    Referral Reason:   Specialty Services Required    Requested Specialty:   Behavioral Health    Number of Visits Requested:   1     No orders of the defined types were placed in this encounter.    I discussed the assessment and treatment plan with the patient. The patient was provided an opportunity to ask questions and  all were answered. The patient agreed with the plan and demonstrated an understanding of the instructions.   The patient was advised to call back or seek an in-person evaluation if the symptoms worsen or if the condition fails to improve as anticipated.  I spent 30 minutes on the day of the encounter to include pre-visit record review, face-to-face time with the patient and post visit ordering of test.   Beatrice Lecher, MD

## 2021-02-02 NOTE — Progress Notes (Signed)
Donnald Garre been sick for weeks now with my stomach, it must be my nerves. I throw up for no reason. The nausea comes on so suddenly and I can't stop it or control it. No diarrhea associated. But she does have some constipation because of this.   I have been isolated since covid  Pt would like a referral for therapy. Somewhere in [place]

## 2021-02-03 NOTE — Assessment & Plan Note (Signed)
We will go ahead and refer her to behavioral health she is very interested in talking with somebody she just feels like life is been really overwhelming and stressful and has had a lot of concerns and issues with her children in particular.

## 2021-02-03 NOTE — Assessment & Plan Note (Signed)
Recommend doing a cleanout with MiraLAX.  And then may be considering taking magnesium daily supplement over-the-counter she really wants to get off using laxatives frequently.  And she does feel like the Cymbalta has been helpful but has made her feel more constipated.

## 2021-02-03 NOTE — Assessment & Plan Note (Signed)
I do think she could have some component of cyclical vomiting which could certainly be triggered by her stress but I still would really like her to consider seeing GI.  The PPI did help with her GERD and abdominal pain but has not helped with the vomiting.

## 2021-02-03 NOTE — Assessment & Plan Note (Signed)
Recommend continue PPI since it has helped with her reflux and the abdominal pain.

## 2021-02-05 ENCOUNTER — Other Ambulatory Visit: Payer: Self-pay | Admitting: Pulmonary Disease

## 2021-02-08 ENCOUNTER — Other Ambulatory Visit: Payer: Self-pay

## 2021-02-08 ENCOUNTER — Ambulatory Visit (INDEPENDENT_AMBULATORY_CARE_PROVIDER_SITE_OTHER): Payer: Medicare Other | Admitting: Pulmonary Disease

## 2021-02-08 ENCOUNTER — Encounter (HOSPITAL_COMMUNITY): Payer: Self-pay | Admitting: Radiology

## 2021-02-08 ENCOUNTER — Telehealth: Payer: Self-pay | Admitting: Family Medicine

## 2021-02-08 ENCOUNTER — Encounter: Payer: Self-pay | Admitting: Pulmonary Disease

## 2021-02-08 VITALS — BP 124/82 | HR 66 | Temp 97.8°F | Ht 66.0 in | Wt 153.8 lb

## 2021-02-08 DIAGNOSIS — J449 Chronic obstructive pulmonary disease, unspecified: Secondary | ICD-10-CM

## 2021-02-08 DIAGNOSIS — Z720 Tobacco use: Secondary | ICD-10-CM

## 2021-02-08 DIAGNOSIS — J439 Emphysema, unspecified: Secondary | ICD-10-CM

## 2021-02-08 DIAGNOSIS — J431 Panlobular emphysema: Secondary | ICD-10-CM | POA: Diagnosis not present

## 2021-02-08 LAB — PULMONARY FUNCTION TEST
DL/VA % pred: 53 %
DL/VA: 2.22 ml/min/mmHg/L
DLCO cor % pred: 55 %
DLCO cor: 12.42 ml/min/mmHg
DLCO unc % pred: 55 %
DLCO unc: 12.49 ml/min/mmHg
FEF 25-75 Post: 0.81 L/sec
FEF 25-75 Pre: 0.51 L/sec
FEF2575-%Change-Post: 58 %
FEF2575-%Pred-Post: 32 %
FEF2575-%Pred-Pre: 20 %
FEV1-%Change-Post: 16 %
FEV1-%Pred-Post: 55 %
FEV1-%Pred-Pre: 47 %
FEV1-Post: 1.59 L
FEV1-Pre: 1.37 L
FEV1FVC-%Change-Post: 6 %
FEV1FVC-%Pred-Pre: 63 %
FEV6-%Change-Post: 11 %
FEV6-%Pred-Post: 81 %
FEV6-%Pred-Pre: 73 %
FEV6-Post: 2.9 L
FEV6-Pre: 2.61 L
FEV6FVC-%Change-Post: 0 %
FEV6FVC-%Pred-Post: 99 %
FEV6FVC-%Pred-Pre: 98 %
FVC-%Change-Post: 9 %
FVC-%Pred-Post: 81 %
FVC-%Pred-Pre: 74 %
FVC-Post: 3.02 L
FVC-Pre: 2.75 L
Post FEV1/FVC ratio: 53 %
Post FEV6/FVC ratio: 96 %
Pre FEV1/FVC ratio: 50 %
Pre FEV6/FVC Ratio: 95 %

## 2021-02-08 MED ORDER — BREZTRI AEROSPHERE 160-9-4.8 MCG/ACT IN AERO: 2.0000 | INHALATION_SPRAY | Freq: Two times a day (BID) | RESPIRATORY_TRACT | 6 refills | Status: DC

## 2021-02-08 MED ORDER — ALBUTEROL SULFATE HFA 108 (90 BASE) MCG/ACT IN AERS: 1.0000 | INHALATION_SPRAY | Freq: Four times a day (QID) | RESPIRATORY_TRACT | 6 refills | Status: DC | PRN

## 2021-02-08 NOTE — Telephone Encounter (Signed)
Yes, lets try downstairs.  We will cross her fingers that they are in network.

## 2021-02-08 NOTE — Telephone Encounter (Signed)
Dr. Madilyn Fireman   I have sent Ms. Hasler's referral to [facility] and [facility] both in [place] and neither place takes her insurance. Would you like me to send it downstairs?  Jenny Reichmann

## 2021-02-08 NOTE — Patient Instructions (Signed)
COPD with panlobular emphysema-GOLD group A.  No exacerbations >24 months. Discussed bronchodilator management and evaluation of severity of COPD with PFTs --REFILL Breztri TWO puffs TWICE a day. Use a spacer --REFILL Albuterol as needed for shortness of breath or wheezing --ENCOURAGE regular activity including aerobic activity  Tobacco abuse Patient is an active smoker. We discussed smoking cessation for 3 minutes. We discussed triggers and stressors and ways to deal with them. We discussed barriers to continued smoking and benefits of smoking cessation. Provided patient with information cessation techniques and interventions including [place] quitline.  CT lung screen Due 08/2021  Follow-up with me 3 months

## 2021-02-08 NOTE — Progress Notes (Signed)
Synopsis: Referred in October 2020 for COPD by Emeterio Reeve, DO.   Subjective:   PATIENT ID: Erin [name] GENDER: female DOB: [birth year], MRN: [id number]  Chief Complaint  Patient presents with   Follow-up    PFT done today, pt states she has been wheezing   Erin [name] is a 60 year old female active smoker with COPD, hx of cholangiocarcinoma with liver involvement s/p cholecystectomy and partial liver resection.   Synopsis: She was previously followed by Dr. Carlis Abbott since 01/2019. She established care with me in 07/2020. She is compliant on Breztri. Rinses her mouth with each use but concerned about getting thrush. Also states inhaler has metallic taste. At baseline, she reports heavy breathing at rest and exertion. Has occasional cough and wheeze during the day. No nighttime symptoms. She has no limitations in activity with her symptoms. She remains an active smoker and continues to due to stress with her family including her children and grandchildren. She is not interested in quitting smoking. She expresses concern about not receiving her CT lung results. Denies recent exacerbations needing steroids or antibiotics.  11/25/20 She reports that she is having shortness of breath at baseline. She has wheezing with activity. She has social stressors with her daughter and grandson. She currently smokes 1ppd. She is compliant with her Breztri.   02/08/21 She has been compliant with Breztri daily. She has chronic cough with smoking. Has wheezing during the day. Does not awaken her at night. No exacerbations since our last visit. However does use her albuterol three times daily. She has many social stressors. Her apartment complex has recently had many arrests and safety is a concern. She is looking for a new home.  Social Hx: Currently smokes 1 ppd. 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 Dx of bi-polar depression in EPIC   Headache(784.0)    migraines   Hot flashes, menopausal    HPV (human papilloma virus) infection    Lung nodule, multiple    Osteopenia    Seizures ([place])    unknown reason for seizures- last seizurein Sept 2012   TIA (transient ischemic attack)      Allergies  Allergen Reactions   Bee Venom Anaphylaxis   Butalbital-Apap-Caffeine Other (See Comments)    Abd. pain   Singulair  [Montelukast Sodium] Other (See Comments)    "Sinus infection for months"   Ciprofloxacin Nausea And Vomiting   Tylenol [Acetaminophen] Other (See Comments)    Can not take due to patient's liver cancer and resection.   Amoxicillin Diarrhea    Severe diarrhea   Lyrica [Pregabalin] Itching     Immunization History  Administered Date(s) Administered   Influenza,inj,Quad PF,6+ Mos 02/03/2019, 01/01/2020   PFIZER(Purple Top)SARS-COV-2 Vaccination 07/16/2019, 08/10/2019, 01/16/2020   Pneumococcal Polysaccharide-23 02/03/2015   Tdap 05/08/2010   Zoster, Live 01/04/2014    Outpatient Medications Prior to Visit  Medication Sig Dispense Refill   albuterol (PROVENTIL HFA) 108 (90 Base) MCG/ACT inhaler Inhale  1-2 puffs into the lungs every 6 (six) hours as needed for wheezing or shortness of breath. 24 g 3   atorvastatin (LIPITOR) 20 MG tablet Take 1 tablet (20 mg total) by mouth daily. 90 tablet 3   BREZTRI AEROSPHERE 160-9-4.8 MCG/ACT AERO Inhale 2 puffs into the lungs daily.     DULoxetine (CYMBALTA) 30 MG capsule Take 1 capsule (30 mg total) by mouth daily. 90 capsule 1   omeprazole (PRILOSEC) 40 MG capsule Take 1 capsule (40 mg total) by mouth  daily. 30 capsule 0   ondansetron (ZOFRAN ODT) 8 MG disintegrating tablet Take 1 tablet (8 mg total) by mouth every 8 (eight) hours as needed for nausea or vomiting. 24 tablet 0   Suvorexant (BELSOMRA) 20 MG TABS Take 1 tablet by mouth at bedtime. 90 tablet 1   No facility-administered medications prior to visit.    Review of Systems  Constitutional:  Negative for chills, diaphoresis, fever, malaise/fatigue and weight loss.  HENT:  Negative for congestion.   Respiratory:  Positive for cough, shortness of breath and wheezing. Negative for hemoptysis and sputum production.   Cardiovascular:  Negative for chest pain, palpitations and leg swelling.    Objective:   Vitals:   02/08/21 0948  BP: 124/82  Pulse: 66  Temp: 97.8 F (36.6 C)  SpO2: 96%  Weight: 153 lb 12.8 oz (69.8 kg)  Height: '5\' 6"'  (1.676 m)   Physical Exam: General: Well-appearing, no acute distress HENT: [place], AT Eyes: EOMI, no scleral icterus Respiratory: Clear to auscultation bilaterally.  No crackles, wheezing or rales Cardiovascular: RRR, -M/R/G, no JVD Extremities:-Edema,-tenderness Neuro: AAO x4, CNII-XII grossly intact Psych: Normal mood, normal affect  Chest Imaging- films reviewed: CT C-spine 08/04/2015- lung images reviewed- panlobular emphysema, apical scarring  CT chest 03/11/2018-report reviewed-emphysema, apical scarring, small lung nodules, the largest being 5 mm in diameter.  No mediastinal or hilar adenopathy.  Postsurgical changes in the right hepatic lobe and small hypodensities in the left hepatic lobe-stable.  Per CT report as far back as May 2014 there has been a right upper lobe 5 mm nodule.  CT chest 02/24/2019- severe centrilobular emphysema.  Nodule in RUL, RML, lingula, LLL.  No significant mediastinal or hilar adenopathy.  4 cm ascending thoracic aortic aneurysm.  CT Chest 04/29/20 - moderate centrilobular emphysema. Stable 43m LUL and LLL.  CT Chest Lung Screen 08/24/20 - moderate bullous  and centrilobular emphysema.   Pulmonary Functions Testing Results:  [facility](no standards available for comparison of FEV1 and FVC)- both read as moderate obstruction  03/17/2018: FVC 2.37 L FEV1 1.25 L Ratio 53%  04/21/2018: FVC 2.85 L FEV1 1.57 L Ratio 55%  10/20/2018: FVC 3.29 L FEV1 1.89 L Ratio 57%  02/08/21 FVC 3.02 (81%) FEV1 1.59 (55%) Ratio 50  TLC 103% DLCO 55% Interpretation: Moderately severe obstructive defect with air trapping and decreased DLCO  10/30/2016: 6-minute walk test-97% on room air at rest, desaturated to 90% on room air during ambulation and recovered to 93% on room air by the end of the test.  Ambulated 660 feet.  [facility] 2014 liver masses- cholangiocarcinoma  Echocardiogram 08/03/2016: Outside report reviewed LVEF 60 to 65%, no regional wall motion abnormalities, normal diastolic function.  Normal LA, RV, RA.  Mild AR, trace MR    Assessment & Plan:   60year old female active smoker with COPD with emphysema and chronic bronchitis who presents for follow-up. Symptomatic on triple therapy. Social stressors  contribute to her continued tobacco use. Last exacerbation 12/2019  COPD with panlobular emphysema-GOLD group B --REFILL Breztri TWO puffs TWICE a day. Use a spacer --REFILL Albuterol as needed for shortness of breath or wheezing --ENCOURAGE regular activity including aerobic activity  Tobacco abuse Patient is an active smoker. We discussed smoking cessation for 3 minutes. We discussed triggers and stressors and ways to deal with them. We discussed barriers to continued smoking and benefits of smoking cessation. Provided patient with information cessation techniques and interventions including  quitline.  Subcentimeter pulmonary nodules-high risk given ongoing tobacco abuse and history of cholangiocarcinoma. We reviewed CT chest report and discussed stability of left lung nodules over two years,  unlikely to progress to lung cancer at this point. -Annual CT scan. Next due 08/2021  Health Maintenance Immunization History  Administered Date(s) Administered   Influenza,inj,Quad PF,6+ Mos 02/03/2019, 01/01/2020   PFIZER(Purple Top)SARS-COV-2 Vaccination 07/16/2019, 08/10/2019, 01/16/2020   Pneumococcal Polysaccharide-23 02/03/2015   Tdap 05/08/2010   Zoster, Live 01/04/2014   No orders of the defined types were placed in this encounter.  Meds ordered this encounter  Medications   BREZTRI AEROSPHERE 160-9-4.8 MCG/ACT AERO    Sig: Inhale 2 puffs into the lungs in the morning and at bedtime.    Dispense:  10.7 g    Refill:  6   albuterol (PROVENTIL HFA) 108 (90 Base) MCG/ACT inhaler    Sig: Inhale 1-2 puffs into the lungs every 6 (six) hours as needed for wheezing or shortness of breath.    Dispense:  24 g    Refill:  6    Reference order # [id number]   Return in about 3 months (around 05/11/2021).   I have spent a total time of 36-minutes on the day of the appointment reviewing prior documentation, coordinating care and discussing medical diagnosis and plan with the patient/family. Past medical history, allergies, medications were reviewed. Pertinent imaging, labs and tests included in this note have been reviewed and interpreted independently by me.  Jaskarn Schweer Rodman Pickle, MD [facility] 02/08/2021 10:13 AM

## 2021-02-08 NOTE — Progress Notes (Signed)
PFT done today. 

## 2021-02-13 ENCOUNTER — Telehealth (INDEPENDENT_AMBULATORY_CARE_PROVIDER_SITE_OTHER): Payer: Medicare Other | Admitting: Medical-Surgical

## 2021-02-13 ENCOUNTER — Encounter: Payer: Self-pay | Admitting: Medical-Surgical

## 2021-02-13 VITALS — BP 124/72 | HR 75

## 2021-02-13 DIAGNOSIS — R110 Nausea: Secondary | ICD-10-CM

## 2021-02-13 DIAGNOSIS — K5900 Constipation, unspecified: Secondary | ICD-10-CM

## 2021-02-13 DIAGNOSIS — R1110 Vomiting, unspecified: Secondary | ICD-10-CM

## 2021-02-13 DIAGNOSIS — R197 Diarrhea, unspecified: Secondary | ICD-10-CM | POA: Diagnosis not present

## 2021-02-13 NOTE — Progress Notes (Signed)
Constipation, random dry heaving  Used dulcolax and miralax with no results Has Zofran but has not used it Wants urgent referral to GI

## 2021-02-13 NOTE — Progress Notes (Signed)
Virtual Visit via Telephone   I connected with  Erin [name]  on 02/13/21 by telephone/telehealth and verified that I am speaking with the correct person using two identifiers.   I discussed the limitations, risks, security and privacy concerns of performing an evaluation and management service by telephone, including the higher likelihood of inaccurate diagnosis and treatment, and the availability of in person appointments.  We also discussed the likely need of an additional face to face encounter for complete and high quality delivery of care.  I also discussed with the patient that there may be a patient responsible charge related to this service. The patient expressed understanding and wishes to proceed.  Provider location is in medical facility. Patient location is at their home, different from provider location. People involved in care of the patient during this telehealth encounter were myself, my nurse/medical assistant, and my front office/scheduling team member.  CC: GI issues  HPI: Pleasant 60 year old female presenting via telephone after being unable to connect via video visit.  She presents reporting continued GI symptoms.  She has been seen for this several times over the last month without improvement in her symptoms.  Reports that she has alternating constipation and diarrhea as well as abrupt nausea that occurs without warning followed by dry heaves.  Has been using over-the-counter laxatives regularly.  Notes that her last bowel movement was yesterday after several days of laxative use.  She is very frustrated and reports that her throat is raw and her abdomen hurts from the dry heaving.  Very poor appetite and poor intake although she reports she has not lost any weight.  Would like a referral to GI placed urgently so that she does not have to wait another month to get in with them.  Review of Systems: See HPI for pertinent positives and negatives.   Objective Findings:     General: Speaking full sentences, no audible heavy breathing.  Sounds alert and appropriately interactive.    Impression and Recommendations:    1. Nausea 2. Constipation, unspecified constipation type 3. Diarrhea, unspecified type 4. Dry heaves Unclear etiology.  Suspect there is a measure of psychological influence on her GI symptoms.  She does have a history of chronic laxative use from review of her records making this a bit more complicated.  On evaluation of her recent abdominal CT, it looks like there was further imaging recommended in the form of abdominal MRI.  This has not been done to date.  Per patient request, referring to GI for further evaluation. - Ambulatory referral to Gastroenterology  I discussed the above assessment and treatment plan with the patient. The patient was provided an opportunity to ask questions and all were answered. The patient agreed with the plan and demonstrated an understanding of the instructions.   The patient was advised to call back or seek an in-person evaluation if the symptoms worsen or if the condition fails to improve as anticipated.  25 minutes of non-face-to-face time was provided during this encounter.  Return if symptoms worsen or fail to improve. ___________________________________________ Samuel Bouche, DNP, APRN, FNP-BC [facility] [place]

## 2021-02-20 ENCOUNTER — Telehealth: Payer: Medicare Other | Admitting: Medical-Surgical

## 2021-02-21 ENCOUNTER — Other Ambulatory Visit: Payer: Self-pay

## 2021-02-21 ENCOUNTER — Encounter: Payer: Self-pay | Admitting: Family Medicine

## 2021-02-21 ENCOUNTER — Ambulatory Visit (INDEPENDENT_AMBULATORY_CARE_PROVIDER_SITE_OTHER): Payer: Medicare Other | Admitting: Family Medicine

## 2021-02-21 VITALS — BP 134/77 | HR 77 | Temp 98.4°F | Wt 151.1 lb

## 2021-02-21 DIAGNOSIS — R935 Abnormal findings on diagnostic imaging of other abdominal regions, including retroperitoneum: Secondary | ICD-10-CM | POA: Diagnosis not present

## 2021-02-21 DIAGNOSIS — K769 Liver disease, unspecified: Secondary | ICD-10-CM | POA: Diagnosis not present

## 2021-02-21 DIAGNOSIS — K5900 Constipation, unspecified: Secondary | ICD-10-CM | POA: Diagnosis not present

## 2021-02-21 DIAGNOSIS — Z23 Encounter for immunization: Secondary | ICD-10-CM

## 2021-02-21 MED ORDER — LINACLOTIDE 145 MCG PO CAPS
145.0000 ug | ORAL_CAPSULE | Freq: Every day | ORAL | 0 refills | Status: DC
Start: 2021-02-21 — End: 2021-02-23

## 2021-02-21 NOTE — Patient Instructions (Signed)
MRI Abdomen ordered - someone will be calling you to schedule Linzess added Shingrix vaccine prescription sent to [facility] Go to your upcoming GI appointment

## 2021-02-21 NOTE — Progress Notes (Signed)
Acute Office Visit  Subjective:    Patient ID: Erin [name], female    DOB: October 25, 1960, 60 y.o.   MRN: [id number]  Chief Complaint  Patient presents with   Results    CT scan    HPI Patient is in today to discuss her CT results.   Reports that she got a letter in the mail stating she needed to review her results with a provider. States nobody has gone over these with her yet. She has been having ongoing GI issues. Last seen by Joy FNP in our office on 02/13/21 and an urgent GI referral was placed. States she continues to struggle with constipation, does have occasional diarrhea - reports she has done well on linzess in the past and would like to try again. Last BM was today but was only a few small, hard pebbles. States she has not had any blood in her stool, but does notice occasional mucus. She did have an episode of diarrhea on Sunday. States she cannot tolerate Miralax, metamucil, etc because of her occasional nausea and dry heaves.   She denies any chest pain, shortness of breath, GU changes, rashes, malaise, fevers.   Past Medical History:  Diagnosis Date   Anxiety    Arthritis    hands and feet   ASCUS (atypical squamous cells of undetermined significance) on Pap smear    Blood transfusion 1983   In [place]   BRCA2 gene mutation positive 2014   Bursitis    Cancer ([place])    liver   Chronic constipation    Condyloma    COPD (chronic obstructive pulmonary disease) ([place])    Depression    per pt h/o depression- no meds currently. Dx of bi-polar depression in EPIC   Headache(784.0)    migraines   Hot flashes, menopausal    HPV (human papilloma virus) infection    Lung nodule, multiple    Osteopenia    Seizures ([place])    unknown reason for seizures- last seizurein Sept 2012   TIA (transient ischemic attack)     Past Surgical History:  Procedure Laterality Date   ABDOMINOPLASTY     LAPAROSCOPY  01/04/2011   Procedure: LAPAROSCOPY OPERATIVE;  Surgeon: Juliene Pina C. Hulan Fray,  MD;  Location: [facility];  Service: Gynecology;  Laterality: N/A;   LAPAROSCOPY  06/14   gall bladder and 1/2 liver   SALPINGOOPHORECTOMY  01/04/2011   Procedure: SALPINGO OOPHERECTOMY;  Surgeon: Myra C. Hulan Fray, MD;  Location: [facility];  Service: Gynecology;  Laterality: Bilateral;   TOTAL VAGINAL HYSTERECTOMY  60 yrs old    Family History  Problem Relation Age of Onset   Breast cancer Mother        postmenopausal   Depression Mother    Drug abuse Mother    Drug abuse Father    Lung cancer Father    Alcohol abuse Daughter    Breast cancer Daughter    Drug abuse Sister    Breast cancer Maternal Aunt    Ovarian cancer Neg Hx    Uterine cancer Neg Hx     Social History   Socioeconomic History   Marital status: Divorced    Spouse name: Not on file   Number of children: 2   Years of education: 12th grade   Highest education level: 12th grade  Occupational History   Occupation: Disabled.  Tobacco Use   Smoking status: Every Day    Packs/day: 1.00    Years: 40.00    Pack  years: 40.00    Types: Cigarettes   Smokeless tobacco: Never   Tobacco comments:    0.5 pack a day  Vaping Use   Vaping Use: Never used  Substance and Sexual Activity   Alcohol use: No    Comment: One beer two weeks ago.   Drug use: Not Currently    Types: Marijuana, Cocaine    Comment: history of use   Sexual activity: Not Currently    Partners: Male  Other Topics Concern   Not on file  Social History Narrative   Lives alone with her cat. She has two children. She enjoys watching t.v.   Social Determinants of Health   Financial Resource Strain: Low Risk    Difficulty of Paying Living Expenses: Not hard at all  Food Insecurity: No Food Insecurity   Worried About Charity fundraiser in the Last Year: Never true   Arboriculturist in the Last Year: Never true  Transportation Needs: No Transportation Needs   Lack of Transportation (Medical): No   Lack of Transportation (Non-Medical): No  Physical  Activity: Inactive   Days of Exercise per Week: 0 days   Minutes of Exercise per Session: 0 min  Stress: Stress Concern Present   Feeling of Stress : Very much  Social Connections: Socially Isolated   Frequency of Communication with Friends and Family: Never   Frequency of Social Gatherings with Friends and Family: Never   Attends Religious Services: Never   Marine scientist or Organizations: No   Attends Music therapist: Never   Marital Status: Divorced  Human resources officer Violence: Not At Risk   Fear of Current or Ex-Partner: No   Emotionally Abused: No   Physically Abused: No   Sexually Abused: No    Outpatient Medications Prior to Visit  Medication Sig Dispense Refill   albuterol (PROVENTIL HFA) 108 (90 Base) MCG/ACT inhaler Inhale 1-2 puffs into the lungs every 6 (six) hours as needed for wheezing or shortness of breath. 24 g 6   atorvastatin (LIPITOR) 20 MG tablet Take 1 tablet (20 mg total) by mouth daily. 90 tablet 3   BREZTRI AEROSPHERE 160-9-4.8 MCG/ACT AERO Inhale 2 puffs into the lungs in the morning and at bedtime. 10.7 g 6   DULoxetine (CYMBALTA) 30 MG capsule Take 1 capsule (30 mg total) by mouth daily. 90 capsule 1   omeprazole (PRILOSEC) 40 MG capsule Take 1 capsule (40 mg total) by mouth daily. 30 capsule 0   ondansetron (ZOFRAN ODT) 8 MG disintegrating tablet Take 1 tablet (8 mg total) by mouth every 8 (eight) hours as needed for nausea or vomiting. 24 tablet 0   Suvorexant (BELSOMRA) 20 MG TABS Take 1 tablet by mouth at bedtime. 90 tablet 1   No facility-administered medications prior to visit.    Allergies  Allergen Reactions   Bee Venom Anaphylaxis   Butalbital-Apap-Caffeine Other (See Comments)    Abd. pain   Singulair  [Montelukast Sodium] Other (See Comments)    "Sinus infection for months"   Ciprofloxacin Nausea And Vomiting   Tylenol [Acetaminophen] Other (See Comments)    Can not take due to patient's liver cancer and resection.    Amoxicillin Diarrhea    Severe diarrhea   Lyrica [Pregabalin] Itching    Review of Systems All review of systems negative except what is listed in the HPI     Objective:    Physical Exam Vitals reviewed.  Constitutional:  Appearance: Normal appearance.  HENT:     Head: Normocephalic and atraumatic.  Cardiovascular:     Pulses: Normal pulses.  Pulmonary:     Effort: Pulmonary effort is normal.  Abdominal:     General: Abdomen is flat. Bowel sounds are normal. There is no distension.     Palpations: Abdomen is soft. There is no mass.     Tenderness: There is no right CVA tenderness, left CVA tenderness, guarding or rebound.     Hernia: No hernia is present.     Comments: Mild tenderness to palpation of LUQ/LLQ  Skin:    General: Skin is warm and dry.  Neurological:     Mental Status: She is alert and oriented to person, place, and time.  Psychiatric:        Mood and Affect: Mood normal.        Behavior: Behavior normal.        Thought Content: Thought content normal.        Judgment: Judgment normal.    BP 134/77 (BP Location: Left Arm, Patient Position: Sitting, Cuff Size: Normal)   Pulse 77   Temp 98.4 F (36.9 C) (Oral)   Wt 151 lb 1.9 oz (68.5 kg)   SpO2 97%   BMI 24.39 kg/m  Wt Readings from Last 3 Encounters:  02/21/21 151 lb 1.9 oz (68.5 kg)  02/08/21 153 lb 12.8 oz (69.8 kg)  02/02/21 155 lb (70.3 kg)    Health Maintenance Due  Topic Date Due   HIV Screening  Never done   Hepatitis C Screening  Never done    There are no preventive care reminders to display for this patient.   Lab Results  Component Value Date   TSH 2.13 12/23/2020   Lab Results  Component Value Date   WBC 5.2 12/23/2020   HGB 13.6 12/23/2020   HCT 40.3 12/23/2020   MCV 93.7 12/23/2020   PLT 261 12/23/2020   Lab Results  Component Value Date   NA 141 12/23/2020   K 4.1 12/23/2020   CO2 26 12/23/2020   GLUCOSE 87 12/23/2020   BUN 12 12/23/2020   CREATININE  0.79 12/23/2020   BILITOT 0.5 12/23/2020   ALKPHOS 70 04/29/2020   AST 19 12/23/2020   ALT 18 12/23/2020   PROT 6.7 12/23/2020   ALBUMIN 4.7 04/29/2020   CALCIUM 9.5 12/23/2020   ANIONGAP 10 04/29/2020   EGFR 86 12/23/2020   Lab Results  Component Value Date   CHOL 255 (H) 12/23/2020   Lab Results  Component Value Date   HDL 48 (L) 12/23/2020   Lab Results  Component Value Date   LDLCALC 176 (H) 12/23/2020   Lab Results  Component Value Date   TRIG 160 (H) 12/23/2020   Lab Results  Component Value Date   CHOLHDL 5.3 (H) 12/23/2020   No results found for: HGBA1C     Assessment & Plan:   1. Liver disease 2. Constipation, unspecified constipation type 4. Abnormal CT of the abdomen Reports she has done well on Linzess in the past. Will try again. Encouraged her to keep her upcoming GI appointment. CT report recommending MRI follow-up - patient feels like she will be able to tolerate. Would like me to go ahead and place the order so results will be available before GI appointment. Patient aware of signs/symptoms requiring further/urgent evaluation.  - linaclotide (LINZESS) 145 MCG CAPS capsule; Take 1 capsule (145 mcg total) by mouth daily.  Dispense: 30 capsule;  Refill: 0 - MR Abdomen W Wo Contrast; Future  3. Need for shingles vaccine - Varicella-zoster vaccine IM (Shingrix)   Follow-up in 4-6 weeks after seeing GI to establish care with Samuel Bouche, FNP. Please contact office for sooner follow-up if symptoms do not improve or worsen. Seek emergency care if symptoms become severe.   Purcell Nails Olevia Bowens, DNP, FNP-C

## 2021-02-23 ENCOUNTER — Other Ambulatory Visit: Payer: Self-pay | Admitting: Family Medicine

## 2021-02-23 DIAGNOSIS — K5900 Constipation, unspecified: Secondary | ICD-10-CM

## 2021-02-27 ENCOUNTER — Ambulatory Visit: Payer: Medicare Other

## 2021-02-27 ENCOUNTER — Other Ambulatory Visit: Payer: Self-pay

## 2021-02-27 DIAGNOSIS — R935 Abnormal findings on diagnostic imaging of other abdominal regions, including retroperitoneum: Secondary | ICD-10-CM

## 2021-02-27 DIAGNOSIS — K769 Liver disease, unspecified: Secondary | ICD-10-CM

## 2021-02-27 MED ORDER — GADOBUTROL 1 MMOL/ML IV SOLN
7.0000 mL | Freq: Once | INTRAVENOUS | Status: AC | PRN
  Administered 2021-02-27: 7 mL via INTRAVENOUS

## 2021-02-28 NOTE — Progress Notes (Signed)
LVM for pt to call to discuss.  T. Kelsye Loomer, CMA  

## 2021-03-23 ENCOUNTER — Ambulatory Visit (HOSPITAL_COMMUNITY): Payer: Self-pay | Admitting: Licensed Clinical Social Worker

## 2021-03-23 NOTE — Progress Notes (Signed)
Therapist contacted patient through My Chart for assessment and she did not respond. Session is a no show.

## 2021-04-03 NOTE — Progress Notes (Signed)
   Complete physical exam  Patient: Erin [name]   DOB: 02/03/1999   60 y.o. Female  MRN: [id number]  Subjective:    No chief complaint on file.   Erin [name] is a 60 y.o. female who presents today for a complete physical exam. She reports consuming a {diet types:17450} diet. {types:19826} She generally feels {DESC; WELL/FAIRLY WELL/POORLY:18703}. She reports sleeping {DESC; WELL/FAIRLY WELL/POORLY:18703}. She {does/does not:200015} have additional problems to discuss today.    Most recent fall risk assessment:    10/11/2021   10:42 AM  Fall Risk   Falls in the past year? 0  Number falls in past yr: 0  Injury with Fall? 0  Risk for fall due to : No Fall Risks  Follow up Falls evaluation completed     Most recent depression screenings:    10/11/2021   10:42 AM 09/01/2020   10:46 AM  PHQ 2/9 Scores  PHQ - 2 Score 0 0  PHQ- 9 Score 5     {VISON DENTAL STD PSA (Optional):27386}  {History (Optional):23778}  Patient Care Team: Saloma Cadena, NP as PCP - General (Nurse Practitioner)   Outpatient Medications Prior to Visit  Medication Sig   fluticasone (FLONASE) 50 MCG/ACT nasal spray Place 2 sprays into both nostrils in the morning and at bedtime. After 7 days, reduce to once daily.   norgestimate-ethinyl estradiol (SPRINTEC 28) 0.25-35 MG-MCG tablet Take 1 tablet by mouth daily.   Nystatin POWD Apply liberally to affected area 2 times per day   spironolactone (ALDACTONE) 100 MG tablet Take 1 tablet (100 mg total) by mouth daily.   No facility-administered medications prior to visit.    ROS        Objective:     There were no vitals taken for this visit. {Vitals History (Optional):23777}  Physical Exam   No results found for any visits on 11/16/21. {Show previous labs (optional):23779}    Assessment & Plan:    Routine Health Maintenance and Physical Exam  Immunization History  Administered Date(s) Administered   DTaP 04/19/1999, 06/15/1999,  08/24/1999, 05/09/2000, 11/23/2003   Hepatitis A 09/19/2007, 09/24/2008   Hepatitis B 02/04/1999, 03/14/1999, 08/24/1999   HiB (PRP-OMP) 04/19/1999, 06/15/1999, 08/24/1999, 05/09/2000   IPV 04/19/1999, 06/15/1999, 02/12/2000, 11/23/2003   Influenza,inj,Quad PF,6+ Mos 12/25/2013   Influenza-Unspecified 03/26/2012   MMR 02/11/2001, 11/23/2003   Meningococcal Polysaccharide 09/24/2011   Pneumococcal Conjugate-13 05/09/2000   Pneumococcal-Unspecified 08/24/1999, 11/07/1999   Tdap 09/24/2011   Varicella 02/12/2000, 09/19/2007    Health Maintenance  Topic Date Due   HIV Screening  Never done   Hepatitis C Screening  Never done   INFLUENZA VACCINE  11/14/2021   PAP-Cervical Cytology Screening  11/16/2021 (Originally 02/03/2020)   PAP SMEAR-Modifier  11/16/2021 (Originally 02/03/2020)   TETANUS/TDAP  11/16/2021 (Originally 09/23/2021)   HPV VACCINES  Discontinued   COVID-19 Vaccine  Discontinued    Discussed health benefits of physical activity, and encouraged her to engage in regular exercise appropriate for her age and condition.  Problem List Items Addressed This Visit   None Visit Diagnoses     Annual physical exam    -  Primary   Cervical cancer screening       Need for Tdap vaccination          No follow-ups on file.     Netha Dafoe, NP   

## 2021-04-04 ENCOUNTER — Ambulatory Visit (INDEPENDENT_AMBULATORY_CARE_PROVIDER_SITE_OTHER): Payer: Self-pay | Admitting: Medical-Surgical

## 2021-04-04 DIAGNOSIS — Z91199 Patient's noncompliance with other medical treatment and regimen due to unspecified reason: Secondary | ICD-10-CM

## 2021-04-07 ENCOUNTER — Ambulatory Visit (INDEPENDENT_AMBULATORY_CARE_PROVIDER_SITE_OTHER): Payer: Medicare Other | Admitting: Licensed Clinical Social Worker

## 2021-04-07 DIAGNOSIS — F419 Anxiety disorder, unspecified: Secondary | ICD-10-CM

## 2021-04-07 DIAGNOSIS — F439 Reaction to severe stress, unspecified: Secondary | ICD-10-CM | POA: Diagnosis not present

## 2021-04-07 DIAGNOSIS — F32A Depression, unspecified: Secondary | ICD-10-CM

## 2021-04-07 DIAGNOSIS — F411 Generalized anxiety disorder: Secondary | ICD-10-CM
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
## 2021-05-10 ENCOUNTER — Ambulatory Visit (HOSPITAL_COMMUNITY): Payer: Medicare Other | Admitting: Licensed Clinical Social Worker

## 2021-05-18 ENCOUNTER — Ambulatory Visit (HOSPITAL_COMMUNITY): Payer: Medicare Other | Admitting: Licensed Clinical Social Worker

## 2021-05-24 ENCOUNTER — Encounter: Payer: Self-pay | Admitting: Pulmonary Disease

## 2021-05-24 ENCOUNTER — Ambulatory Visit (INDEPENDENT_AMBULATORY_CARE_PROVIDER_SITE_OTHER): Payer: Medicare Other | Admitting: Pulmonary Disease

## 2021-05-24 ENCOUNTER — Ambulatory Visit (HOSPITAL_COMMUNITY): Payer: Medicare Other | Admitting: Licensed Clinical Social Worker

## 2021-05-24 ENCOUNTER — Other Ambulatory Visit: Payer: Self-pay

## 2021-05-24 VITALS — BP 110/68 | HR 80 | Temp 97.8°F | Ht 67.0 in | Wt 162.0 lb

## 2021-05-24 DIAGNOSIS — J449 Chronic obstructive pulmonary disease, unspecified: Secondary | ICD-10-CM | POA: Diagnosis not present

## 2021-05-24 MED ORDER — ALBUTEROL SULFATE HFA 108 (90 BASE) MCG/ACT IN AERS: 1.0000 | INHALATION_SPRAY | Freq: Four times a day (QID) | RESPIRATORY_TRACT | 11 refills | Status: DC | PRN

## 2021-05-24 MED ORDER — BREZTRI AEROSPHERE 160-9-4.8 MCG/ACT IN AERO: 2.0000 | INHALATION_SPRAY | Freq: Two times a day (BID) | RESPIRATORY_TRACT | 11 refills | Status: DC

## 2021-05-24 NOTE — Progress Notes (Signed)
Synopsis: Referred in October 2020 for COPD by Erin Reeve, DO.   Subjective:   PATIENT ID: Erin [name] GENDER: female DOB: 1961-02-27, MRN: [id number]  Chief Complaint  Patient presents with   Follow-up    Breathing is getting worse, nothing makes it better   Ms. Erin [name] is a 61 year old female active smoker with COPD, history of cholangiocarcinoma with liver involvement s/p cholecystectomy and partial liver resection who presents for follow-up.  Synopsis: She was previously followed by Dr. Carlis [name] since 01/2019. She established care with me in 07/2020. She is compliant on Breztri. Rinses her mouth with each use but concerned about getting thrush. Also states inhaler has metallic taste. At baseline, she reports heavy breathing at rest and exertion. Has occasional cough and wheeze during the day. No nighttime symptoms. She has no limitations in activity with her symptoms. She remains an active smoker and continues to due to stress with her family including her children and grandchildren. She is not interested in quitting smoking. She expresses concern about not receiving her CT lung results. Denies recent exacerbations needing steroids or antibiotics.  11/25/20 She reports that she is having shortness of breath at baseline. She has wheezing with activity. She has social stressors with her daughter and grandson. She currently smokes 1ppd. She is compliant with her Breztri.   02/08/21 She has been compliant with Breztri daily. She has chronic cough with smoking. Has wheezing during the day. Does not awaken her at night. No exacerbations since our last visit. However does use her albuterol three times daily. She has many social stressors. Her apartment complex has recently had many arrests and safety is a concern. She is looking for a new home.  05/24/21 Since our last visit she reports overall her breathing has worsened. She is compliant with her Erin [name] and noticed worsening  symptoms when off of it. She uses albuterol three times a day for shortness of breath. Her cough is mainly dry but does have production in morning. She is actively smoking 1/2 ppd. She is planning to start walking daily as the weather improves. She has issues with bilious vomiting and frustrated with her PCP.   Social Hx: Currently smokes 1 ppd. 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  Past Medical History:  Diagnosis Date   Anxiety    Arthritis    hands and feet   ASCUS (atypical squamous cells of undetermined significance) on Pap smear    Blood transfusion 1983   In [place]   BRCA2 gene mutation positive 2014   Bursitis    Cancer ([place])    liver   Chronic constipation    Condyloma    COPD (chronic obstructive pulmonary disease) ([place])    Depression    per pt h/o depression- no meds currently. Dx of bi-polar depression in EPIC   Headache(784.0)    migraines   Hot flashes, menopausal    HPV (human papilloma virus) infection    Lung nodule, multiple    Osteopenia    Seizures ([place])    unknown reason for seizures- last seizurein Sept 2012   TIA (transient ischemic attack)      Allergies  Allergen Reactions   Bee Venom Anaphylaxis   Butalbital-Apap-Caffeine Other (See Comments)    Abd. pain   Singulair  [Montelukast Sodium] Other (See Comments)    "Sinus infection for months"   Ciprofloxacin Nausea And Vomiting   Tylenol [Acetaminophen] Other (See Comments)    Can not take due to  patient's liver cancer and resection.   Amoxicillin Diarrhea    Severe diarrhea   Lyrica [Pregabalin] Itching     Immunization History  Administered Date(s) Administered   Influenza,inj,Quad PF,6+ Mos 02/03/2019, 01/01/2020   PFIZER(Purple Top)SARS-COV-2 Vaccination 07/16/2019, 08/10/2019, 01/16/2020   Pneumococcal Polysaccharide-23 02/03/2015   Tdap 05/08/2010   Zoster, Live 01/04/2014    Outpatient Medications Prior to Visit  Medication Sig Dispense  Refill   atorvastatin (LIPITOR) 20 MG tablet Take 1 tablet (20 mg total) by mouth daily. 90 tablet 3   DULoxetine (CYMBALTA) 30 MG capsule Take 1 capsule (30 mg total) by mouth daily. 90 capsule 1   LINZESS 145 MCG CAPS capsule TAKE 1 CAPSULE(145 MCG) BY MOUTH DAILY 30 capsule 0   omeprazole (PRILOSEC) 40 MG capsule Take 1 capsule (40 mg total) by mouth daily. 30 capsule 0   ondansetron (ZOFRAN ODT) 8 MG disintegrating tablet Take 1 tablet (8 mg total) by mouth every 8 (eight) hours as needed for nausea or vomiting. 24 tablet 0   Suvorexant (BELSOMRA) 20 MG TABS Take 1 tablet by mouth at bedtime. 90 tablet 1   albuterol (PROVENTIL HFA) 108 (90 Base) MCG/ACT inhaler Inhale 1-2 puffs into the lungs every 6 (six) hours as needed for wheezing or shortness of breath. 24 g 6   BREZTRI AEROSPHERE 160-9-4.8 MCG/ACT AERO Inhale 2 puffs into the lungs in the morning and at bedtime. 10.7 g 6   No facility-administered medications prior to visit.    Review of Systems  Constitutional:  Negative for chills, diaphoresis, fever, malaise/fatigue and weight loss.  HENT:  Negative for congestion.   Respiratory:  Positive for cough, sputum production and shortness of breath. Negative for hemoptysis and wheezing.   Cardiovascular:  Negative for chest pain, palpitations and leg swelling.    Objective:   Vitals:   05/24/21 1058  BP: 110/68  Pulse: 80  Temp: 97.8 F (36.6 C)  TempSrc: Oral  SpO2: 96%  Weight: 162 lb (73.5 kg)  Height: '5\' 7"'  (1.702 m)   Physical Exam: General: Well-appearing, no acute distress HENT: [place], AT Eyes: EOMI, no scleral icterus Respiratory: Clear to auscultation bilaterally.  No crackles, wheezing or rales Cardiovascular: RRR, -M/R/G, no JVD Extremities:-Edema,-tenderness Neuro: AAO x4, CNII-XII grossly intact Psych: Normal mood, normal affect  Chest Imaging- films reviewed: CT C-spine 08/04/2015- lung images reviewed- panlobular emphysema, apical scarring  CT chest  03/11/2018-report reviewed-emphysema, apical scarring, small lung nodules, the largest being 5 mm in diameter.  No mediastinal or hilar adenopathy.  Postsurgical changes in the right hepatic lobe and small hypodensities in the left hepatic lobe-stable.  Per CT report as far back as May 2014 there has been a right upper lobe 5 mm nodule.  CT chest 02/24/2019- severe centrilobular emphysema.  Nodule in RUL, RML, lingula, LLL.  No significant mediastinal or hilar adenopathy.  4 cm ascending thoracic aortic aneurysm.  CT Chest 04/29/20 - moderate centrilobular emphysema. Stable 33m LUL and LLL.  CT Chest Lung Screen 08/24/20 - moderate bullous and centrilobular emphysema.   Pulmonary Functions Testing Results:  [facility]no standards available for comparison of FEV1 and FVC)- both read as moderate obstruction  03/17/2018: FVC 2.37 L FEV1 1.25 L Ratio 53%  04/21/2018: FVC 2.85 L FEV1 1.57 L Ratio 55%  10/20/2018: FVC 3.29 L FEV1 1.89 L Ratio 57%  02/08/21 FVC 3.02 (81%) FEV1 1.59 (55%) Ratio 50  TLC 103% DLCO 55% Interpretation: Moderately severe obstructive defect with air trapping  and decreased DLCO  10/30/2016: 6-minute walk test-97% on room air at rest, desaturated to 90% on room air during ambulation and recovered to 93% on room air by the end of the test.  Ambulated 660 feet.  [facility] ([facility]): 2014 liver masses- cholangiocarcinoma  Echocardiogram 08/03/2016: Outside report reviewed LVEF 60 to 65%, no regional wall motion abnormalities, normal diastolic function.  Normal LA, RV, RA.  Mild AR, trace MR    Assessment & Plan:   61 year old female active smoker with COPD with emphysema and chronic bronchitis who presents for follow-up. Symptomatic on triple therapy however last exacerbation 12/2019. Discussed clinical course and management of COPD/asthma including bronchodilator regimen and action plan for exacerbation. If her symptoms worsen, may need  to add nebulizer therapy. She will work on increasing physical activity.  COPD with panlobular emphysema- GOLD group B --CONTINUE Breztri TWO puffs TWICE a day. Use a spacer. REFILL --CONTINUE Albuterol as needed for shortness of breath or wheezing. REFILL --ENCOURAGE regular activity including aerobic activity  Tobacco abuse Patient is an active smoker. We discussed smoking cessation for 3 minutes. We discussed triggers and stressors and ways to deal with them. We discussed barriers to continued smoking and benefits of smoking cessation. Provided patient with information cessation techniques and interventions including [place] quitline.  Subcentimeter pulmonary nodules-high risk given ongoing tobacco abuse and history of cholangiocarcinoma. We reviewed CT chest report and discussed stability of left lung nodules over two years, unlikely to progress to lung cancer at this point. -Ordered CT lung screen  Health Maintenance Immunization History  Administered Date(s) Administered   Influenza,inj,Quad PF,6+ Mos 02/03/2019, 01/01/2020   PFIZER(Purple Top)SARS-COV-2 Vaccination 07/16/2019, 08/10/2019, 01/16/2020   Pneumococcal Polysaccharide-23 02/03/2015   Tdap 05/08/2010   Zoster, Live 01/04/2014   Orders Placed This Encounter  Procedures   CT CHEST LUNG CA SCREEN LOW DOSE W/O CM    Expected in May 2023    Standing Status:   Future    Standing Expiration Date:   05/24/2022    Order Specific Question:   Preferred Imaging Location?    Answer:   [facility]   Ambulatory Referral for Lung Cancer Scre    Referral Priority:   Routine    Referral Type:   Consultation    Referral Reason:   Specialty Services Required    Number of Visits Requested:   1    Meds ordered this encounter  Medications   BREZTRI AEROSPHERE 160-9-4.8 MCG/ACT AERO    Sig: Inhale 2 puffs into the lungs in the morning and at bedtime.    Dispense:  10.7 g    Refill:  11   albuterol (PROVENTIL HFA) 108 (90  Base) MCG/ACT inhaler    Sig: Inhale 1-2 puffs into the lungs every 6 (six) hours as needed for wheezing or shortness of breath.    Dispense:  18 g    Refill:  11    Reference order # [id number]   Return in about 3 months (around 08/21/2021).   I have spent a total time of 38-minutes on the day of the appointment reviewing prior documentation, coordinating care and discussing medical diagnosis and plan with the patient/family. Past medical history, allergies, medications were reviewed. Pertinent imaging, labs and tests included in this note have been reviewed and interpreted independently by me.  Heavan Francom Rodman Pickle, MD [facility] 05/24/2021 11:22 AM

## 2021-05-24 NOTE — Patient Instructions (Signed)
°  COPD with panlobular emphysema-GOLD group B --CONTINUE Breztri TWO puffs TWICE a day. Use a spacer. REFILL --CONTINUE Albuterol as needed for shortness of breath or wheezing. REFILL --ENCOURAGE regular activity including aerobic activity  Follow-up with me in 3 months after CT lung screen

## 2021-06-18 ENCOUNTER — Other Ambulatory Visit: Payer: Self-pay | Admitting: Osteopathic Medicine

## 2021-06-19 ENCOUNTER — Ambulatory Visit (INDEPENDENT_AMBULATORY_CARE_PROVIDER_SITE_OTHER): Payer: Medicare Other | Admitting: Sports Medicine

## 2021-06-19 ENCOUNTER — Ambulatory Visit (INDEPENDENT_AMBULATORY_CARE_PROVIDER_SITE_OTHER): Payer: Medicare Other

## 2021-06-19 ENCOUNTER — Other Ambulatory Visit: Payer: Self-pay

## 2021-06-19 DIAGNOSIS — M25551 Pain in right hip: Secondary | ICD-10-CM

## 2021-06-19 DIAGNOSIS — M25552 Pain in left hip: Secondary | ICD-10-CM | POA: Diagnosis not present

## 2021-06-19 DIAGNOSIS — G8929 Other chronic pain: Secondary | ICD-10-CM | POA: Diagnosis not present

## 2021-06-19 DIAGNOSIS — M2550 Pain in unspecified joint: Secondary | ICD-10-CM | POA: Diagnosis not present

## 2021-06-19 NOTE — Assessment & Plan Note (Signed)
Pleasant 61 year old female, chronic bilateral hip pain, she has known hip osteoarthritis and bilateral trochanteric bursitis, she also has L4-L5 lumbar spinal stenosis all of which can contribute to hip pain. ?Pain is lateral, trochanteric bursae were last injected a year ago. ?Repeated today. ?

## 2021-06-19 NOTE — Assessment & Plan Note (Addendum)
Ordering full rheumatoid testing. ? ?CK levels are elevated at 460 indicating muscle injury, this is classically from statins, it is possible that her aches are from the atorvastatin, I would like her to stop this for now and discuss an alternative with her PCP when she finds one. ?

## 2021-06-19 NOTE — Progress Notes (Addendum)
? ? ?  Procedures performed today:   ? ?Procedure: Real-time Ultrasound Guided injection of the left greater trochanteric bursa ?Device: Samsung HS60  ?Verbal informed consent obtained.  ?Time-out conducted.  ?Noted no overlying erythema, induration, or other signs of local infection.  ?Skin prepped in a sterile fashion.  ?Local anesthesia: Topical Ethyl chloride.  ?With sterile technique and under real time ultrasound guidance:  Noted normal-appearing hip abductors, 1 cc Kenalog 40, 2 cc lidocaine, 2 cc bupivacaine injected easily ?Completed without difficulty  ?Advised to call if fevers/chills, erythema, induration, drainage, or persistent bleeding.  ?Images permanently stored and available for review in PACS.  ?Impression: Technically successful ultrasound guided injection. ?  ?Procedure: Real-time Ultrasound Guided injection of the right greater trochanteric bursa ?Device: Samsung HS60  ?Verbal informed consent obtained.  ?Time-out conducted.  ?Noted no overlying erythema, induration, or other signs of local infection.  ?Skin prepped in a sterile fashion.  ?Local anesthesia: Topical Ethyl chloride.  ?With sterile technique and under real time ultrasound guidance:  Noted normal-appearing hip abductors, 1 cc Kenalog 40, 2 cc lidocaine, 2 cc bupivacaine injected easily ?Completed without difficulty  ?Advised to call if fevers/chills, erythema, induration, drainage, or persistent bleeding.  ?Images permanently stored and available for review in PACS.  ?Impression: Technically successful ultrasound guided injection. ? ?Independent interpretation of notes and tests performed by another provider:  ? ?None. ? ?Brief History, Exam, Impression, and Recommendations:   ? ?Chronic hip pain, bilateral ?Pleasant 61 year old female, chronic bilateral hip pain, she has known hip osteoarthritis and bilateral trochanteric bursitis, she also has L4-L5 lumbar spinal stenosis all of which can contribute to hip pain. ?Pain is lateral,  trochanteric bursae were last injected a year ago. ?Repeated today. ? ?Polyarthralgia ?Ordering full rheumatoid testing. ? ? ?CK levels are elevated at 460 indicating muscle injury, this is classically from statins, it is possible that her aches are from the atorvastatin, I would like her to stop this for now and discuss an alternative with her PCP when she finds one. ? ? ? ?___________________________________________ ?Gwen Her. Dianah Field, M.D., ABFM., CAQSM. ?Primary Care and Sports Medicine ?[place] ? ?Adjunct Instructor of Family Medicine  ?[employer] ?

## 2021-06-21 LAB — COMPREHENSIVE METABOLIC PANEL
AG Ratio: 1.8 (calc) (ref 1.0–2.5)
ALT: 19 U/L (ref 6–29)
AST: 28 U/L (ref 10–35)
Albumin: 4.3 g/dL (ref 3.6–5.1)
Alkaline phosphatase (APISO): 98 U/L (ref 37–153)
BUN: 15 mg/dL (ref 7–25)
CO2: 28 mmol/L (ref 20–32)
Calcium: 9.3 mg/dL (ref 8.6–10.4)
Chloride: 104 mmol/L (ref 98–110)
Creat: 0.92 mg/dL (ref 0.50–1.05)
Globulin: 2.4 g/dL (calc) (ref 1.9–3.7)
Glucose, Bld: 80 mg/dL (ref 65–99)
Potassium: 4.1 mmol/L (ref 3.5–5.3)
Sodium: 140 mmol/L (ref 135–146)
Total Bilirubin: 0.4 mg/dL (ref 0.2–1.2)
Total Protein: 6.7 g/dL (ref 6.1–8.1)

## 2021-06-21 LAB — CBC WITH DIFFERENTIAL/PLATELET
Absolute Monocytes: 428 cells/uL (ref 200–950)
Basophils Absolute: 41 cells/uL (ref 0–200)
Basophils Relative: 0.6 %
Eosinophils Absolute: 82 cells/uL (ref 15–500)
Eosinophils Relative: 1.2 %
HCT: 37.9 % (ref 35.0–45.0)
Hemoglobin: 12.7 g/dL (ref 11.7–15.5)
Lymphs Abs: 2475 cells/uL (ref 850–3900)
MCH: 31.3 pg (ref 27.0–33.0)
MCHC: 33.5 g/dL (ref 32.0–36.0)
MCV: 93.3 fL (ref 80.0–100.0)
MPV: 10.9 fL (ref 7.5–12.5)
Monocytes Relative: 6.3 %
Neutro Abs: 3774 cells/uL (ref 1500–7800)
Neutrophils Relative %: 55.5 %
Platelets: 258 10*3/uL (ref 140–400)
RBC: 4.06 10*6/uL (ref 3.80–5.10)
RDW: 12 % (ref 11.0–15.0)
Total Lymphocyte: 36.4 %
WBC: 6.8 10*3/uL (ref 3.8–10.8)

## 2021-06-21 LAB — LUPUS(12) PANEL
Anti Nuclear Antibody (ANA): NEGATIVE
C3 Complement: 112 mg/dL (ref 83–193)
C4 Complement: 20 mg/dL (ref 15–57)
ENA SM Ab Ser-aCnc: <1 AI
Rheumatoid fact SerPl-aCnc: <14 IU/mL (ref <14)
Ribosomal P Protein Ab: <1 AI
SM/RNP: <1 AI
SSA (Ro) (ENA) Antibody, IgG: <1 AI
SSB (La) (ENA) Antibody, IgG: <1 AI
Scleroderma (Scl-70) (ENA) Antibody, IgG: <1 AI
Thyroperoxidase Ab SerPl-aCnc: <1 IU/mL (ref <9)
ds DNA Ab: 20 IU/mL — ABNORMAL HIGH

## 2021-06-21 LAB — RHEUMATOID FACTOR (IGA, IGG, IGM)
Rheumatoid Factor (IgA): <5 U (ref <=6)
Rheumatoid Factor (IgG): <5 U (ref <=6)
Rheumatoid Factor (IgM): <5 U (ref <=6)

## 2021-06-21 LAB — SEDIMENTATION RATE: Sed Rate: 6 mm/h (ref 0–30)

## 2021-06-21 LAB — CYCLIC CITRUL PEPTIDE ANTIBODY, IGG: Cyclic Citrullin Peptide Ab: <16 UNITS

## 2021-06-21 LAB — CK: Total CK: 460 U/L — ABNORMAL HIGH (ref 29–143)

## 2021-06-21 LAB — URIC ACID: Uric Acid, Serum: 4.7 mg/dL (ref 2.5–7.0)

## 2021-06-28 ENCOUNTER — Telehealth: Payer: Self-pay

## 2021-06-28 DIAGNOSIS — B009 Herpesviral infection, unspecified: Secondary | ICD-10-CM

## 2021-06-28 MED ORDER — VALACYCLOVIR HCL 500 MG PO TABS
1000.0000 mg | ORAL_TABLET | Freq: Every day | ORAL | 0 refills | Status: DC
Start: 2021-06-28 — End: 2021-10-01

## 2021-06-28 NOTE — Telephone Encounter (Signed)
Pt called requesting refill of Valtrex due to HSV outbreak. Per Dr.Duncan pt can have Valtrex '1000mg'$  daily for five days with no refills. Rx sent. Pt was encouraged to make an appointment due to it almost being three years since we've seen her. Pt states she will call at a more convenient time to schedule.  ?

## 2021-07-30 NOTE — Progress Notes (Addendum)
?HPI with pertinent ROS:  ? ?CC: transfer of care ? ?HPI: ?Pleasant 61 year old female presenting today to transfer care to a new PCP and for the following: ? ?Insomnia- using Bellsomra '20mg'$  nightly as needed for sleep. Does not feel like it's helpful and is interested in something else. Has tried Melatonin and other OTC medications for sleep. Does not like Benadryl, reverse effect.  ? ?COPD/Asthma- followed by Pulmonology. Using [place] as prescribed with prn albuterol inhaler. Was exposed to her grandson over [place] who was sick. Since then, has had a strong cough that has not responded to OTC remedies.  Cough is dry, nonproductive, interfering with sleep.  ? ?Mood/Fibromyalgia- taking Cymbalta '30mg'$  nightly as prescribed. Feels like it's working a little but not the best. No interest in increasing her dose.  ? ?Constipation- previously prescribed Linzess 152mg daily as needed. Doing well lately so has not had to take it.  ? ?GERD- taking Omeprazole '40mg'$  daily. Working well with no concerns today.  ? ?HLD- was unable to tolerate Lipitor so was stopped about a month ago. Has not tried other statins/cholesterol medications. ? ?Tobacco use- smokes 1/2ppd cigarettes. Has been smoking for 49 years. Hates smoking but has not been able to quit. Tried Chantix in the past.  ? ?I reviewed the past medical history, family history, social history, surgical history, and allergies today and no changes were needed.  Please see the problem list section below in epic for further details. ? ? ?Physical exam:  ? ?General: Well Developed, well nourished, and in no acute distress.  ?Neuro: Alert and oriented x3.  ?HEENT: Normocephalic, atraumatic.  ?Skin: Warm and dry. ?Cardiac: Regular rate and rhythm, no murmurs rubs or gallops, no lower extremity edema.  ?Respiratory: Clear to auscultation bilaterally. Not using accessory muscles, speaking in full sentences. ? ?Impression and Recommendations:   ? ?1. Encounter to establish  care ?Reviewed available information and discussed care concerns with patient.  ? ?2. Insomnia ?Switching to trazodone 25-50 mg nightly as needed for sleep.  She does still have Belsomra at home so advised to avoid taking these together.  She will reach out to me to let me know if the trazodone is helpful or not and we will reevaluate if needed. ? ?3. Panlobular emphysema ([place] ?4.  Acute cough ?Follow-up pulmonology.  With her current symptoms, we will go ahead and treat for exacerbation with azithromycin 500 mg today then 250 mg daily x4 days.  [facility] ? ?5. Fibromyalgia ?6. Anxiety state ?7. Bipolar disorder in partial remission, most recent episode unspecified type ([place] ?8. Severe episode of recurrent major depressive disorder, without psychotic features ([place] ?9. Stress and adjustment reaction ?Continue Cymbalta 30 mg daily.  Symptoms fairly well managed.  Patient not interested in increasing doses or being on medications that are mind altering. ? ?10. Gastroesophageal reflux disease with esophagitis without hemorrhage ?Continue omeprazole 40 mg daily. ? ?11. Constipation ?Continue Linzess 145 mcg daily as needed. ? ?12. Mixed hyperlipidemia ?Discontinue Lipitor.  Checking lipid panel and CMP today.  Starting pravastatin 10 mg daily to evaluate tolerance.  Plan to recheck labs in 6 weeks to evaluate response. ? ?13. Tobacco abuse ?Discussed smoking cessation.  She has tried several attempts to quit smoking including Chantix however this has been unsuccessful.  Recommend reducing smoking quantity by 1 cigarette/day every few weeks to see if this is better tolerated. ? ?14.  Preventative health care ?Checking CBC. ? ?Return in about 6  months (around 01/30/2022) for chronic disease follow up. ?___________________________________________ ?Clearnce Sorrel, DNP, APRN, FNP-BC ?Primary Care and Sports Medicine ?[place] ?

## 2021-07-31 ENCOUNTER — Encounter: Payer: Self-pay | Admitting: Medical-Surgical

## 2021-07-31 ENCOUNTER — Ambulatory Visit (INDEPENDENT_AMBULATORY_CARE_PROVIDER_SITE_OTHER): Payer: Medicare Other | Admitting: Medical-Surgical

## 2021-07-31 VITALS — BP 138/84 | HR 64 | Resp 20 | Ht 67.0 in | Wt 168.5 lb

## 2021-07-31 DIAGNOSIS — Z23 Encounter for immunization: Secondary | ICD-10-CM

## 2021-07-31 DIAGNOSIS — E782 Mixed hyperlipidemia: Secondary | ICD-10-CM

## 2021-07-31 DIAGNOSIS — F3170 Bipolar disorder, currently in remission, most recent episode unspecified: Secondary | ICD-10-CM

## 2021-07-31 DIAGNOSIS — F411 Generalized anxiety disorder: Secondary | ICD-10-CM

## 2021-07-31 DIAGNOSIS — F332 Major depressive disorder, recurrent severe without psychotic features: Secondary | ICD-10-CM

## 2021-07-31 DIAGNOSIS — F4329 Adjustment disorder with other symptoms: Secondary | ICD-10-CM

## 2021-07-31 DIAGNOSIS — K2100 Gastro-esophageal reflux disease with esophagitis, without bleeding: Secondary | ICD-10-CM

## 2021-07-31 DIAGNOSIS — Z7689 Persons encountering health services in other specified circumstances: Secondary | ICD-10-CM

## 2021-07-31 DIAGNOSIS — R051 Acute cough: Secondary | ICD-10-CM

## 2021-07-31 DIAGNOSIS — G4700 Insomnia, unspecified: Secondary | ICD-10-CM | POA: Diagnosis not present

## 2021-07-31 DIAGNOSIS — Z720 Tobacco use: Secondary | ICD-10-CM

## 2021-07-31 DIAGNOSIS — Z0000 Encounter for general adult medical examination without abnormal findings: Secondary | ICD-10-CM

## 2021-07-31 DIAGNOSIS — M797 Fibromyalgia: Secondary | ICD-10-CM

## 2021-07-31 DIAGNOSIS — J431 Panlobular emphysema: Secondary | ICD-10-CM | POA: Diagnosis not present

## 2021-07-31 DIAGNOSIS — K5909 Other constipation: Secondary | ICD-10-CM

## 2021-07-31 MED ORDER — PRAVASTATIN SODIUM 10 MG PO TABS
10.0000 mg | ORAL_TABLET | Freq: Every day | ORAL | 3 refills | Status: DC
Start: 2021-07-31 — End: 2021-09-15

## 2021-07-31 MED ORDER — BENZONATATE 200 MG PO CAPS: 200.0000 mg | ORAL_CAPSULE | Freq: Three times a day (TID) | ORAL | 0 refills | Status: DC | PRN

## 2021-07-31 MED ORDER — DULOXETINE HCL 30 MG PO CPEP: 30.0000 mg | ORAL_CAPSULE | Freq: Every day | ORAL | 3 refills | Status: DC

## 2021-07-31 MED ORDER — OMEPRAZOLE 40 MG PO CPDR: 40.0000 mg | DELAYED_RELEASE_CAPSULE | Freq: Every day | ORAL | 3 refills | Status: AC

## 2021-07-31 MED ORDER — TRAZODONE HCL 50 MG PO TABS: 25.0000 mg | ORAL_TABLET | Freq: Every evening | ORAL | 3 refills | Status: DC | PRN

## 2021-07-31 MED ORDER — ONDANSETRON 8 MG PO TBDP: 8.0000 mg | ORAL_TABLET | Freq: Three times a day (TID) | ORAL | 0 refills | Status: DC | PRN

## 2021-07-31 MED ORDER — AZITHROMYCIN 250 MG PO TABS: ORAL_TABLET | ORAL | 0 refills | Status: AC

## 2021-07-31 NOTE — Patient Instructions (Signed)
Weight loss tips and tricks:  1.  Make sure to drink at least 64 ounces of water every day. 2.  Avoid alcohol. 3.  Avoid eating within 3 hours of going to bed. 4.  Cut out sugary drinks such as sweet tea, regular sodas, energy drinks, etc. 5.  Make sure you are getting enough sleep every night. 6.  Start making changes by cutting back portion sizes by 1/3. 7.  Keep a food diary to help identify areas for improvement and promote awareness of bad habits. 8.  Increase your activity.  Choose something you like to do that is fun for you so you are more likely to stick with it. 9.  Do not forget your protein! 10.  Measure your neck, upper arms, waist, hips, and thighs and write those measurements down somewhere before you start your weight loss journey.  Changes in your measurements will tell a far more accurate story than the number on a scale. 11.  As you go, pay attention to how your clothes fit! 12.  Weigh yourself as often or as little as you need to.  Some folks do better weighing every day while others do better with once a week. 13.  Do not get discouraged!  Weight loss efforts are meant to be lifestyle changes.  Once you stop a medication (if you are taking 1), your behaviors and habits will determine if you maintain your weight loss or not.  For those on medications:  1.  Take your medication as prescribed every day first thing in the morning. 2.  Common side effects include nausea and constipation.  To combat this, increased your daily water consumption.  Consider adding in a stool softener (available OTC) if needed.  Also increase fiber intake with vegetables and fruits. 3.  If you have any side effects or concerns while taking medication, please do not hesitate to reach out to us here at the office. 4.  While on weight loss medications, we do require you to follow-up every 4 weeks with an in office visit.  Refills will not be called in early on controlled substances.  Good luck on your  weight loss journey!  Have faith in your self and you will reach your goals!  

## 2021-07-31 NOTE — Addendum Note (Signed)
Addended by: Beverlee Nims on: 07/31/2021 09:26 AM ? ? Modules accepted: Orders ? ?

## 2021-08-01 LAB — COMPLETE METABOLIC PANEL WITH GFR
AG Ratio: 1.8 (calc) (ref 1.0–2.5)
ALT: 20 U/L (ref 6–29)
AST: 26 U/L (ref 10–35)
Albumin: 4.5 g/dL (ref 3.6–5.1)
Alkaline phosphatase (APISO): 83 U/L (ref 37–153)
BUN: 19 mg/dL (ref 7–25)
CO2: 28 mmol/L (ref 20–32)
Calcium: 9.5 mg/dL (ref 8.6–10.4)
Chloride: 105 mmol/L (ref 98–110)
Creat: 0.94 mg/dL (ref 0.50–1.05)
Globulin: 2.5 g/dL (calc) (ref 1.9–3.7)
Glucose, Bld: 94 mg/dL (ref 65–99)
Potassium: 4.1 mmol/L (ref 3.5–5.3)
Sodium: 142 mmol/L (ref 135–146)
Total Bilirubin: 0.7 mg/dL (ref 0.2–1.2)
Total Protein: 7 g/dL (ref 6.1–8.1)
eGFR: 69 mL/min/{1.73_m2} (ref >=60)

## 2021-08-01 LAB — CBC WITH DIFFERENTIAL/PLATELET
Absolute Monocytes: 429 cells/uL (ref 200–950)
Basophils Absolute: 32 cells/uL (ref 0–200)
Basophils Relative: 0.6 %
Eosinophils Absolute: 48 cells/uL (ref 15–500)
Eosinophils Relative: 0.9 %
HCT: 39.9 % (ref 35.0–45.0)
Hemoglobin: 13.4 g/dL (ref 11.7–15.5)
Lymphs Abs: 1776 cells/uL (ref 850–3900)
MCH: 32.3 pg (ref 27.0–33.0)
MCHC: 33.6 g/dL (ref 32.0–36.0)
MCV: 96.1 fL (ref 80.0–100.0)
MPV: 10.9 fL (ref 7.5–12.5)
Monocytes Relative: 8.1 %
Neutro Abs: 3016 cells/uL (ref 1500–7800)
Neutrophils Relative %: 56.9 %
Platelets: 285 10*3/uL (ref 140–400)
RBC: 4.15 10*6/uL (ref 3.80–5.10)
RDW: 13 % (ref 11.0–15.0)
Total Lymphocyte: 33.5 %
WBC: 5.3 10*3/uL (ref 3.8–10.8)

## 2021-08-01 LAB — LIPID PANEL
Cholesterol: 250 mg/dL — ABNORMAL HIGH (ref <200)
HDL: 61 mg/dL (ref >=50)
LDL Cholesterol (Calc): 164 mg/dL (calc) — ABNORMAL HIGH
Non-HDL Cholesterol (Calc): 189 mg/dL (calc) — ABNORMAL HIGH (ref <130)
Total CHOL/HDL Ratio: 4.1 (calc) (ref <5.0)
Triglycerides: 124 mg/dL (ref <150)

## 2021-08-01 NOTE — Addendum Note (Signed)
Addended bySamuel Bouche on: 08/01/2021 07:15 AM ? ? Modules accepted: Orders ? ?

## 2021-09-04 ENCOUNTER — Telehealth: Payer: Self-pay | Admitting: General Practice

## 2021-09-04 NOTE — Telephone Encounter (Signed)
Transition Care Management Unsuccessful Follow-up Telephone Call  Date of discharge and from where:  09/04/21 from [facility]  Attempts:  1st Attempt  Reason for unsuccessful TCM follow-up call:  Left voice message

## 2021-09-05 NOTE — Telephone Encounter (Signed)
Transition Care Management Unsuccessful Follow-up Telephone Call  Date of discharge and from where:  09/04/21 from [facility]  Attempts:  2nd Attempt  Reason for unsuccessful TCM follow-up call:  Left voice message

## 2021-09-06 ENCOUNTER — Ambulatory Visit: Payer: Medicare Other

## 2021-09-06 NOTE — Telephone Encounter (Signed)
Transition Care Management Unsuccessful Follow-up Telephone Call  Date of discharge and from where:  09/04/21 from [facility]  Attempts:  3rd Attempt  Reason for unsuccessful TCM follow-up call:  Left voice message

## 2021-09-15 ENCOUNTER — Encounter: Payer: Self-pay | Admitting: Medical-Surgical

## 2021-09-15 ENCOUNTER — Telehealth (INDEPENDENT_AMBULATORY_CARE_PROVIDER_SITE_OTHER): Payer: Medicare Other | Admitting: Medical-Surgical

## 2021-09-15 DIAGNOSIS — G4700 Insomnia, unspecified: Secondary | ICD-10-CM | POA: Diagnosis not present

## 2021-09-15 MED ORDER — TRAZODONE HCL 100 MG PO TABS: 100.0000 mg | ORAL_TABLET | Freq: Every evening | ORAL | 1 refills | Status: DC | PRN

## 2021-09-15 NOTE — Progress Notes (Signed)
Virtual Visit via Video Note  I connected with Erin [name] on 09/15/21 at 11:10 AM EDT by a video enabled telemedicine application and verified that I am speaking with the correct person using two identifiers.   I discussed the limitations of evaluation and management by telemedicine and the availability of in person appointments. The patient expressed understanding and agreed to proceed.  Patient location: home Provider locations: office  Subjective:    CC: Insomnia  HPI: 61 year old female presenting via MyChart video visit with complaints of continued sleep issues.  She previously took Belsomra 20 mg daily but felt the medication was not helpful.  She was switched to trazodone and has been taking 50 mg nightly.  She complains that she takes the medication at 6 PM so that she should be asleep by 10 PM but she is often still awake at midnight or later.  One night she was up until 3 AM and only managed to get 2 hours of sleep.  She is very frustrated by this.  Has questions about a billing issue as she has gotten a bill for $25 and would like to have this taken care of as she is on a fixed income.  Past medical history, Surgical history, Family history not pertinant except as noted below, Social history, Allergies, and medications have been entered into the medical record, reviewed, and corrections made.   Review of Systems: See HPI for pertinent positives and negatives.   Objective:    General: Speaking clearly in complete sentences without any shortness of breath.  Alert and oriented x3.  Normal judgment. No apparent acute distress.  Impression and Recommendations:    INSOMNIA, CHRONIC Unfortunately, Belsomra was no longer effective.  Trazodone 50 mg nightly does not appear to be effective.  We will increase this to 100 mg nightly.  If this is not helpful, we may need to consider other options.  Mentions gabapentin however she feels this makes you "stupid" and she would like to  avoid this medication at all cost.  Other options may include amitriptyline, nortriptyline, doxepin, or hydroxyzine.   I discussed the assessment and treatment plan with the patient. The patient was provided an opportunity to ask questions and all were answered. The patient agreed with the plan and demonstrated an understanding of the instructions.   The patient was advised to call back or seek an in-person evaluation if the symptoms worsen or if the condition fails to improve as anticipated.  25 minutes of non-face-to-face time was provided during this encounter.  Return for Insomnia follow-up and general care at patient's earliest convenience.Clearnce Sorrel, DNP, APRN, FNP-BC [facility]

## 2021-09-15 NOTE — Assessment & Plan Note (Signed)
Unfortunately, Belsomra was no longer effective.  Trazodone 50 mg nightly does not appear to be effective.  We will increase this to 100 mg nightly.  If this is not helpful, we may need to consider other options.  Mentions gabapentin however she feels this makes you "stupid" and she would like to avoid this medication at all cost.  Other options may include amitriptyline, nortriptyline, doxepin, or hydroxyzine.

## 2021-09-18 ENCOUNTER — Ambulatory Visit (INDEPENDENT_AMBULATORY_CARE_PROVIDER_SITE_OTHER): Payer: Medicare Other | Admitting: Sports Medicine

## 2021-09-18 DIAGNOSIS — M47816 Spondylosis without myelopathy or radiculopathy, lumbar region: Secondary | ICD-10-CM

## 2021-09-18 NOTE — Assessment & Plan Note (Signed)
Pleasant 61 year old female, known lumbar DDD, also bilateral left worse than right trochanteric bursitis. Recently she had a flare in her back pain after trying to lift a bucket. Was seen in the ED and got a shot of steroids. Doing a little better but still has significant pain in the back, worse with sitting, flexion, Valsalva with radiation to the hips. In addition she does have tenderness over the greater trochanteric bursae, left worse than right. I am inclined to think the majority of her discomfort is coming from her back, so we will proceed with a left-sided lumbar epidural, if insufficient improvement of her bursae pain we will do a left greater trochanteric bursa injection.

## 2021-09-18 NOTE — Progress Notes (Signed)
    Procedures performed today:    None.  Independent interpretation of notes and tests performed by another provider:   Lumbar spine MRI personally reviewed, she has degenerative disc disease at multiple levels, worst at L4-L5 with associated facet arthritis bilaterally.  Brief History, Exam, Impression, and Recommendations:    Lumbar spondylosis Pleasant 61 year old female, known lumbar DDD, also bilateral left worse than right trochanteric bursitis. Recently she had a flare in her back pain after trying to lift a bucket. Was seen in the ED and got a shot of steroids. Doing a little better but still has significant pain in the back, worse with sitting, flexion, Valsalva with radiation to the hips. In addition she does have tenderness over the greater trochanteric bursae, left worse than right. I am inclined to think the majority of her discomfort is coming from her back, so we will proceed with a left-sided lumbar epidural, if insufficient improvement of her bursae pain we will do a left greater trochanteric bursa injection.    ___________________________________________ Gwen Her. Dianah Field, M.D., ABFM., CAQSM. Primary Care and [place] In[facility] of [place] [facility]

## 2021-09-20 ENCOUNTER — Ambulatory Visit (INDEPENDENT_AMBULATORY_CARE_PROVIDER_SITE_OTHER): Payer: Medicare Other

## 2021-09-20 DIAGNOSIS — Z122 Encounter for screening for malignant neoplasm of respiratory organs: Secondary | ICD-10-CM | POA: Diagnosis not present

## 2021-09-20 DIAGNOSIS — F17210 Nicotine dependence, cigarettes, uncomplicated: Secondary | ICD-10-CM

## 2021-09-20 DIAGNOSIS — J449 Chronic obstructive pulmonary disease, unspecified: Secondary | ICD-10-CM

## 2021-09-20 NOTE — Progress Notes (Signed)
Established Patient Office Visit  Subjective   Patient ID: Erin [name], female   DOB: 06-27-1960 Age: 61 y.o. MRN: [id number]   Chief Complaint  Patient presents with   Insomnia    HPI 61 year old female presenting today to discuss anxiety.  Notes that her anxiety has always been very high and she has been struggling a lot lately.  Most of her anxiety is related to help and she gets extremely anxious when trying to set up doctors appointments.  She has had liver cancer in the past and due to bad experiences, attending doctors appointments is extremely uncomfortable.  She does note that she is due for follow-up on multiple things over the next few months and she is worried about the level of anxiety there.  Her anxiety is contributing to difficulty sleeping and she has been having significant GI upset including nausea and gagging early in the mornings.  In the past, she has tried multiple medications and at one point reports she was taking 21 different medications that she had to detox herself from.  Previously saw mental health professionals in [place] and reports that she will not go back to them because they overmedicated her.  She did try BuSpar in the past to manage her anxiety but this was not helpful.  She did use Xanax for a few years on an as-needed basis and found this to be the most helpful out of everything.  She currently takes Cymbalta 30 mg daily but unfortunately this does not seem to be helping her much.  She is extremely hesitant to increase the dose since it alters her mind. Notes the trazodone at '100mg'$  still does not help with sleep and thinks her high anxiety is the cause. Not doing counseling due to financial concerns.   Requesting a DNR.  She notes that she has thought about this for a long time and would prefer to be a DNR.  She understands that this means if her heart stops and she stops breathing, there will be no resuscitation efforts attempted.  Also notes that she  would like to donate her body to science rather than having regular burial and funeral.   Objective:    Vitals:   09/21/21 1024  BP: 104/64  Pulse: 70  Resp: 20  Height: '5\' 7"'$  (1.702 m)  Weight: 159 lb 6.4 oz (72.3 kg)  SpO2: 99%  BMI (Calculated): 24.96     Physical Exam Vitals and nursing note reviewed.  Constitutional:      General: She is not in acute distress.    Appearance: Normal appearance. She is not ill-appearing.  HENT:     Head: Normocephalic and atraumatic.  Cardiovascular:     Rate and Rhythm: Normal rate and regular rhythm.  Pulmonary:     Effort: Pulmonary effort is normal. No respiratory distress.  Skin:    General: Skin is warm and dry.  Neurological:     Mental Status: She is alert and oriented to person, place, and time.  Psychiatric:        Mood and Affect: Mood normal.        Behavior: Behavior normal.        Thought Content: Thought content normal.        Judgment: Judgment normal.    No results found for this or any previous visit (from the past 24 hour(s)).     The 10-year ASCVD risk score (Arnett DK, et al., 2019) is: 5.5%   Values used to calculate  the score:     Age: 52 years     Sex: Female     Is Non-Hispanic African American: No     Diabetic: No     Tobacco smoker: Yes     Systolic Blood Pressure: 673 mmHg     Is BP treated: No     HDL Cholesterol: 61 mg/dL     Total Cholesterol: 250 mg/dL   Assessment & Plan:   1. INSOMNIA, CHRONIC Continue trazodone 100 mg nightly as needed for sleep.  2. Stress and adjustment reaction 3. Bipolar disorder in partial remission, most recent episode unspecified type (HCC) Continue Cymbalta 30 mg daily.  Recommend increasing dose however she is hesitant to do this so we will leave this alone.  Her main issue right now is severe anxiety and she has not done well in the past with several medications. Discussed the use of benzodiazepines for severe anxiety and the recommendation for very sparing  use due to the risk of tolerance and dependence.  Over the next 3 months, I will okay the use of clonazepam 0.25-0.5 mg daily as needed but will not continue this past a 74-monthperiod.  If requiring the use of benzodiazepines longer than that, we may need to try to get her in with psychiatry for further evaluation of a maintenance regimen that will work well for her anxiety without causing mind altering side effects.  4. DNR (do not resuscitate) Discussed having a DNR in effect.  She would like to proceed with this so Gold form completed and provided to patient to post in her home.  Copy made and chart updated.  5. Tobacco abuse Recommend smoking cessation.    Return in about 3 months (around 12/22/2021) for mood follow up.  ___________________________________________ JClearnce Sorrel DNP, APRN, FNP-BC [facility]

## 2021-09-21 ENCOUNTER — Encounter: Payer: Self-pay | Admitting: Medical-Surgical

## 2021-09-21 ENCOUNTER — Ambulatory Visit (INDEPENDENT_AMBULATORY_CARE_PROVIDER_SITE_OTHER): Payer: Medicare Other | Admitting: Medical-Surgical

## 2021-09-21 VITALS — BP 104/64 | HR 70 | Resp 20 | Ht 67.0 in | Wt 159.4 lb

## 2021-09-21 DIAGNOSIS — F3170 Bipolar disorder, currently in remission, most recent episode unspecified: Secondary | ICD-10-CM | POA: Diagnosis not present

## 2021-09-21 DIAGNOSIS — G4700 Insomnia, unspecified: Secondary | ICD-10-CM | POA: Diagnosis not present

## 2021-09-21 DIAGNOSIS — F4329 Adjustment disorder with other symptoms: Secondary | ICD-10-CM | POA: Diagnosis not present

## 2021-09-21 DIAGNOSIS — Z66 Do not resuscitate: Secondary | ICD-10-CM | POA: Insufficient documentation

## 2021-09-21 DIAGNOSIS — Z720 Tobacco use: Secondary | ICD-10-CM

## 2021-09-21 MED ORDER — ONDANSETRON 8 MG PO TBDP: 8.0000 mg | ORAL_TABLET | Freq: Three times a day (TID) | ORAL | 0 refills | Status: DC | PRN

## 2021-09-21 MED ORDER — CLONAZEPAM 0.5 MG PO TABS: 0.2500 mg | ORAL_TABLET | Freq: Every day | ORAL | 1 refills | Status: DC | PRN

## 2021-09-25 ENCOUNTER — Ambulatory Visit
Admission: RE | Admit: 2021-09-25 | Discharge: 2021-09-25 | Disposition: A | Discharge status: Home / Self Care | Payer: Medicare Other | Source: Ambulatory Visit | Attending: Sports Medicine | Admitting: Sports Medicine

## 2021-09-25 DIAGNOSIS — M47816 Spondylosis without myelopathy or radiculopathy, lumbar region: Secondary | ICD-10-CM

## 2021-09-25 MED ORDER — METHYLPREDNISOLONE ACETATE 40 MG/ML INJ SUSP (RADIOLOG
80.0000 mg | Freq: Once | INTRAMUSCULAR | Status: AC
  Administered 2021-09-25: 80 mg via EPIDURAL

## 2021-09-25 MED ORDER — IOPAMIDOL (ISOVUE-M 200) INJECTION 41%
1.0000 mL | Freq: Once | INTRAMUSCULAR | Status: AC
  Administered 2021-09-25: 1 mL via EPIDURAL

## 2021-09-25 NOTE — Discharge Instructions (Signed)

## 2021-10-01 ENCOUNTER — Emergency Department (INDEPENDENT_AMBULATORY_CARE_PROVIDER_SITE_OTHER)
Admission: EM | Admit: 2021-10-01 | Discharge: 2021-10-01 | Disposition: A | Discharge status: Home / Self Care | Payer: Medicare Other | Source: Home / Self Care

## 2021-10-01 ENCOUNTER — Other Ambulatory Visit: Payer: Self-pay

## 2021-10-01 DIAGNOSIS — B029 Zoster without complications: Secondary | ICD-10-CM

## 2021-10-01 MED ORDER — TRIAMCINOLONE ACETONIDE 0.025 % EX CREA: 1.0000 | TOPICAL_CREAM | Freq: Three times a day (TID) | CUTANEOUS | 0 refills | Status: DC | PRN

## 2021-10-01 MED ORDER — PREDNISONE 10 MG PO TABS: 10.0000 mg | ORAL_TABLET | Freq: Every day | ORAL | 0 refills | Status: AC

## 2021-10-01 MED ORDER — VALACYCLOVIR HCL 1 G PO TABS
1000.0000 mg | ORAL_TABLET | Freq: Three times a day (TID) | ORAL | 0 refills | Status: AC
Start: 2021-10-01 — End: 2021-10-11

## 2021-10-01 NOTE — ED Triage Notes (Signed)
Pt presents to Urgent Care with c/o rash to R chest, beneath R breast, since this morning. States it is painful and thinks she has shingles.
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
## 2021-10-11 ENCOUNTER — Emergency Department (INDEPENDENT_AMBULATORY_CARE_PROVIDER_SITE_OTHER): Payer: Medicare Other

## 2021-10-11 ENCOUNTER — Emergency Department (INDEPENDENT_AMBULATORY_CARE_PROVIDER_SITE_OTHER)
Admission: RE | Admit: 2021-10-11 | Discharge: 2021-10-11 | Disposition: A | Discharge status: Home / Self Care | Payer: Medicare Other | Source: Ambulatory Visit | Attending: Family Medicine | Admitting: Family Medicine

## 2021-10-11 ENCOUNTER — Telehealth: Payer: Self-pay | Admitting: Emergency Medicine

## 2021-10-11 VITALS — BP 123/83 | HR 69 | Temp 98.9°F | Resp 17 | Ht 67.0 in | Wt 160.0 lb

## 2021-10-11 DIAGNOSIS — S93492A Sprain of other ligament of left ankle, initial encounter: Secondary | ICD-10-CM

## 2021-10-11 DIAGNOSIS — S99912A Unspecified injury of left ankle, initial encounter: Secondary | ICD-10-CM | POA: Diagnosis not present

## 2021-10-11 MED ORDER — OXYCODONE HCL 5 MG PO TABS: 5.0000 mg | ORAL_TABLET | ORAL | 0 refills | Status: DC | PRN

## 2021-10-11 NOTE — Telephone Encounter (Signed)
Follow up call to Erin [name] to confirm appointment w/Dr schmitz on Friiday at 1010. Pt stated she was able to use My Chart at home and had already done the e-checkin

## 2021-10-11 NOTE — ED Triage Notes (Addendum)
Pain to left ankle since Friday  Rolled it stepping off the curb Elevation over the weekend No Ice  Ibuprofen '800mg'$  at 0500 - no relief Pt worried about a  torn ligament  Hurts all the time

## 2021-10-11 NOTE — Discharge Instructions (Signed)
Take the oxycodone as needed severe pain.  Take with food.  Do not drive on oxycodone Wear boot while up.  Wear until you can walk comfortably without support, Limit walking while ankle painful Follow up with sports medicine.  Dr Dianah Field or Dr Raeford Razor
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
## 2021-10-13 ENCOUNTER — Encounter: Payer: Self-pay | Admitting: Family Medicine

## 2021-10-13 ENCOUNTER — Ambulatory Visit: Payer: Self-pay

## 2021-10-13 ENCOUNTER — Ambulatory Visit (HOSPITAL_BASED_OUTPATIENT_CLINIC_OR_DEPARTMENT_OTHER)
Admission: RE | Admit: 2021-10-13 | Discharge: 2021-10-13 | Disposition: A | Discharge status: Home / Self Care | Payer: Medicare Other | Source: Ambulatory Visit | Attending: Family Medicine | Admitting: Family Medicine

## 2021-10-13 ENCOUNTER — Ambulatory Visit (INDEPENDENT_AMBULATORY_CARE_PROVIDER_SITE_OTHER): Payer: Medicare Other | Admitting: Family Medicine

## 2021-10-13 VITALS — BP 120/78 | Ht 67.0 in | Wt 160.0 lb

## 2021-10-13 DIAGNOSIS — S82225A Nondisplaced transverse fracture of shaft of left tibia, initial encounter for closed fracture: Secondary | ICD-10-CM

## 2021-10-13 DIAGNOSIS — S82402A Unspecified fracture of shaft of left fibula, initial encounter for closed fracture: Secondary | ICD-10-CM | POA: Insufficient documentation

## 2021-10-13 DIAGNOSIS — M25572 Pain in left ankle and joints of left foot: Secondary | ICD-10-CM

## 2021-10-13 IMAGING — CT CT CHEST LUNG CANCER SCREENING LOW DOSE W/O CM
2 of 4 series · 15 of 36 positions shown, 18 images · non-contrast
Comparison: 04/29/2020 diagnostic chest CT.

CLINICAL DATA: Forty-five pack-year smoking history. Current
smoker.

EXAM:
CT CHEST WITHOUT CONTRAST LOW-DOSE FOR LUNG CANCER SCREENING
TECHNIQUE: Multidetector CT imaging of the chest was performed following the
standard protocol without IV contrast.

[Series 3: lungs · axial · 0.62mm/px · z∈[-296,-14]mm · 12 of 312 slices shown, 15 images]
[im 15/312  mediastinal]
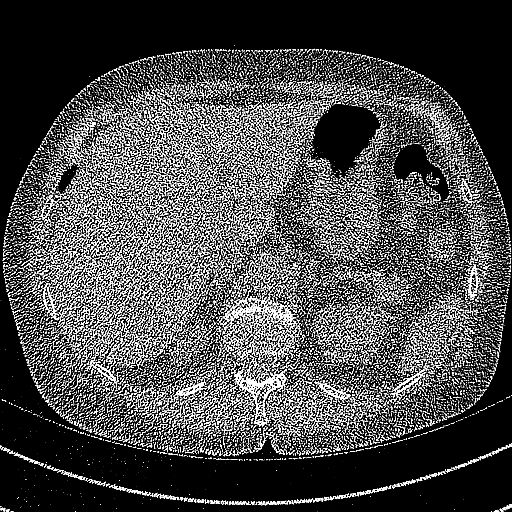
[im 15/312  lung]
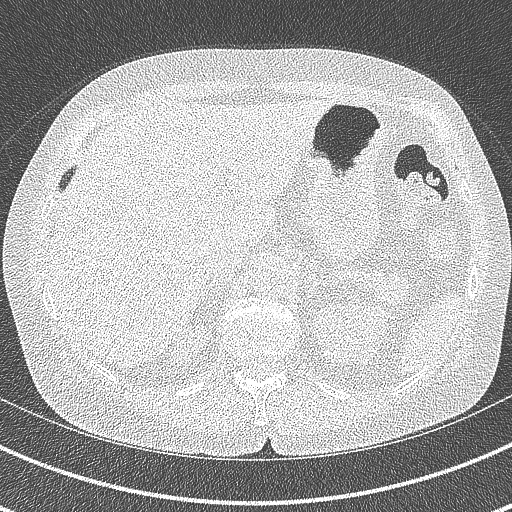
[im 43/312  lung]
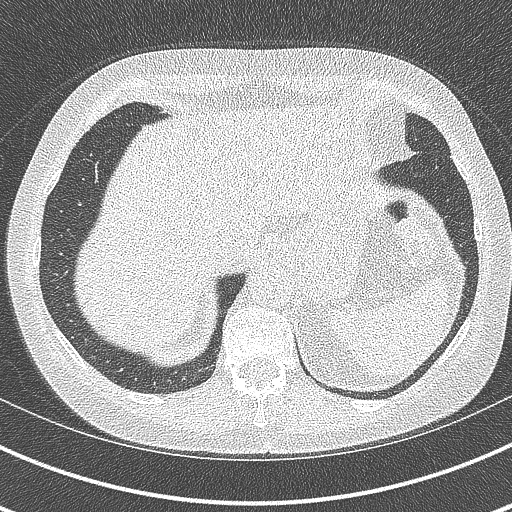
[im 71/312  lung]
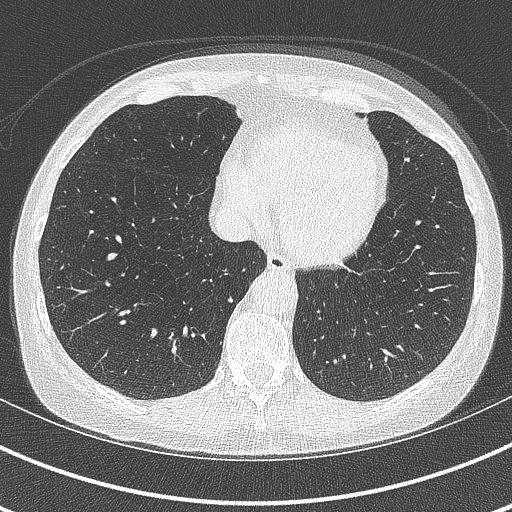
[im 99/312  lung]
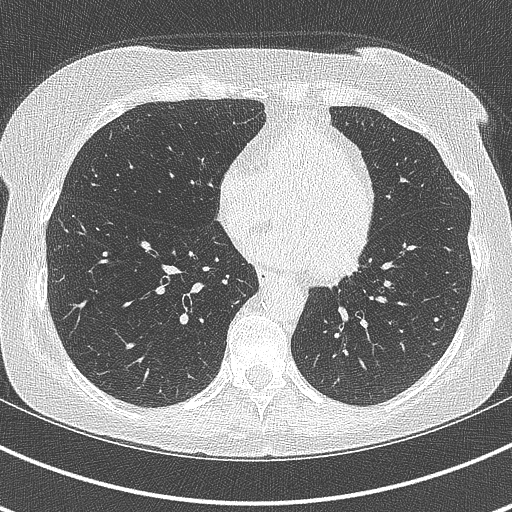
[im 114/312  mediastinal]
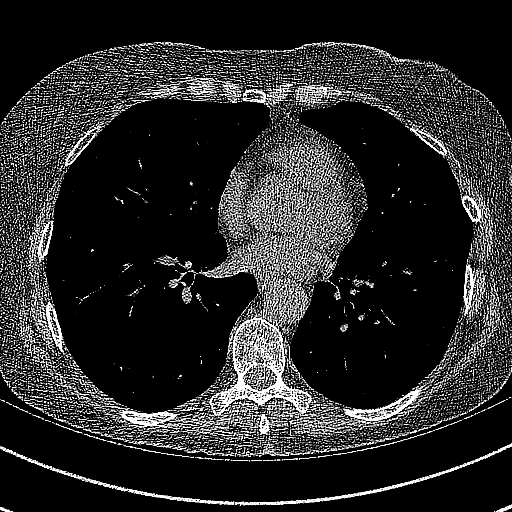
[im 114/312  lung]
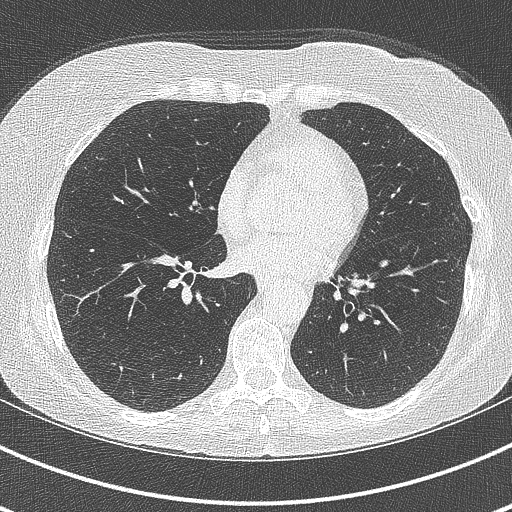
[im 142/312  lung]
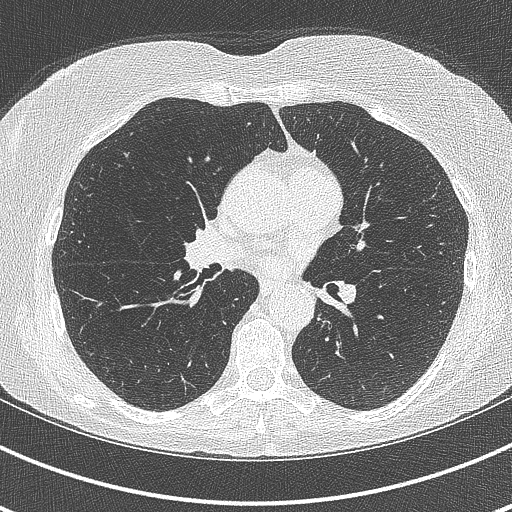
[im 170/312  lung]
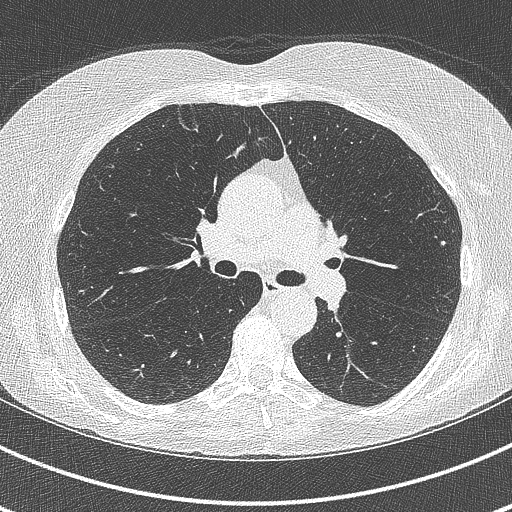
[im 198/312  lung]
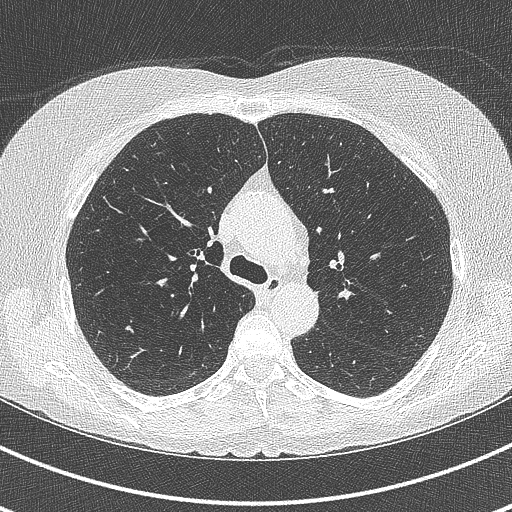
[im 213/312  mediastinal]
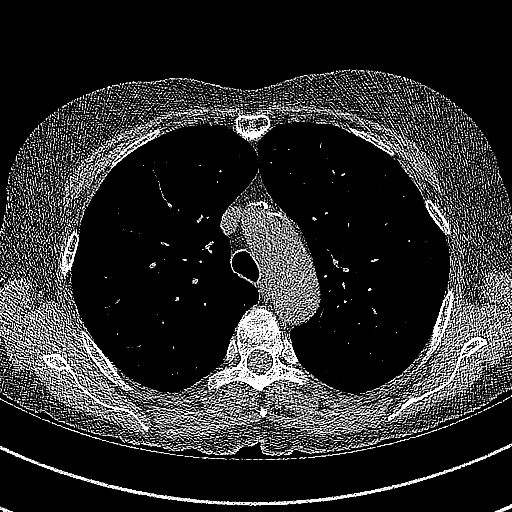
[im 213/312  lung]
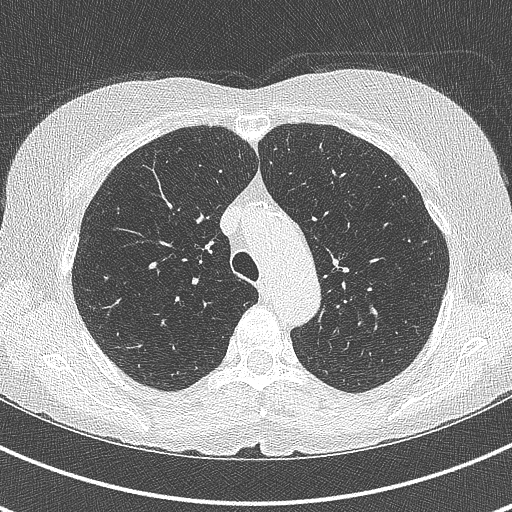
[im 241/312  lung]
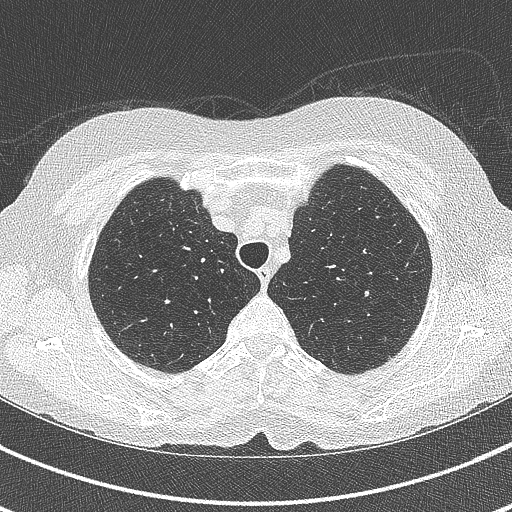
[im 269/312  lung]
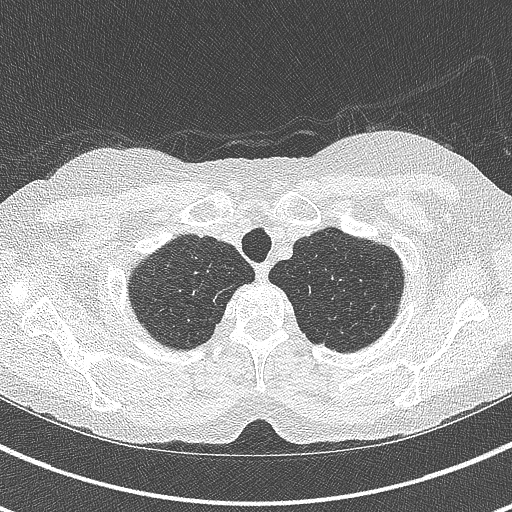
[im 297/312  lung]
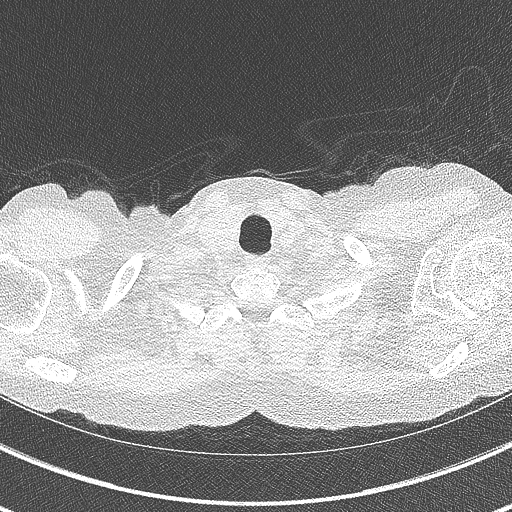

[Series 4: coronal · coronal · 0.62mm/px · 3 of 229 slices shown]
[im 46/229  lung]
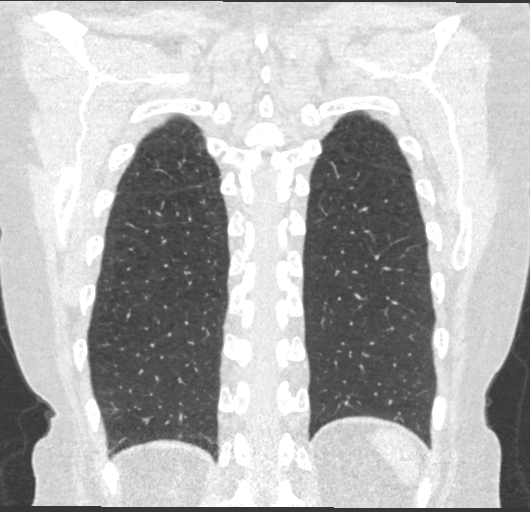
[im 92/229  lung]
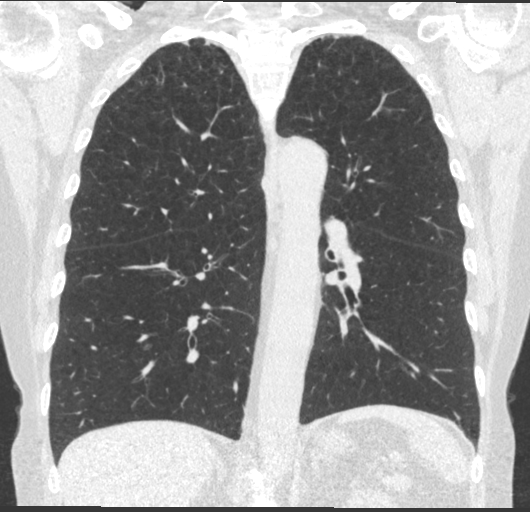
[im 137/229  lung]
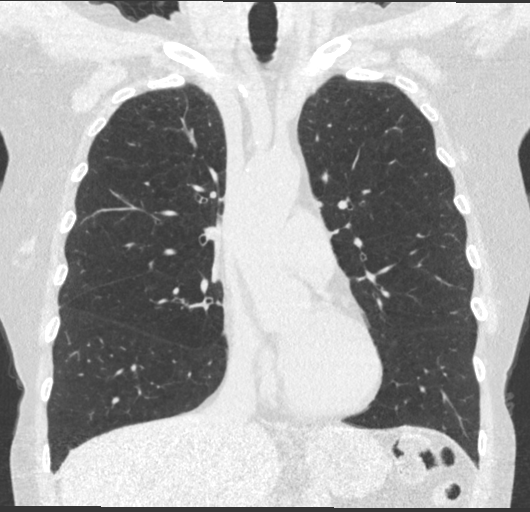

[15 of 36 positions shown; findings below may reference images not displayed]

FINDINGS: Cardiovascular: Aortic atherosclerosis. Normal heart size, without
pericardial effusion.

Mediastinum/Nodes: No mediastinal or definite hilar adenopathy,
given limitations of unenhanced CT.

Lungs/Pleura: No pleural fluid. Mild to moderate bullous emphysema.
Pulmonary nodules of maximally volume derived equivalent diameter
4.0 mm.

Upper Abdomen: Normal imaged portions of the liver, spleen, stomach,
pancreas, adrenal glands, kidneys.

Musculoskeletal: Anterior left fifth nonacute but interval rib
fracture on 36/2.
IMPRESSION: 1. Lung-RADS 2, benign appearance or behavior. Continue annual
screening with low-dose chest CT without contrast in 12 months.
2. Aortic Atherosclerosis (1A0XV-24J.J) and Emphysema (1A0XV-IZF.0).
3. Interval but nonacute anterior left fifth rib fracture.

## 2021-10-13 MED ORDER — OXYCODONE HCL 5 MG PO TABS: 5.0000 mg | ORAL_TABLET | Freq: Four times a day (QID) | ORAL | 0 refills | Status: DC | PRN

## 2021-10-13 NOTE — Progress Notes (Signed)
  Erin [name] - 61 y.o. female MRN [id number]  Date of birth: 03-Oct-1960  SUBJECTIVE:  Including CC & ROS.  No chief complaint on file.   Erin [name] is a 61 y.o. female that is presenting with severe left lower leg pain.  She had an inversion injury about a week ago.  Since that time she had severe pain in the left lower leg.  No history of similar pain. .  Review of the urgent care note from 6/2 shows she was provided Roxicodone. Independent review of the left ankle x-ray from 6/26 shows no acute bony changes  Review of Systems See HPI   HISTORY: Past Medical, Surgical, Social, and Family History Reviewed & Updated per EMR.   Pertinent Historical Findings include:  Past Medical History:  Diagnosis Date   Anxiety    Arthritis    hands and feet   ASCUS (atypical squamous cells of undetermined significance) on Pap smear    Blood transfusion 1983   In [place]   BRCA2 gene mutation positive 2014   Bursitis    Cancer ([place])    liver   Chronic constipation    Condyloma    COPD (chronic obstructive pulmonary disease) ([place])    Depression    per pt h/o depression- no meds currently. Dx of bi-polar depression in EPIC   Headache(784.0)    migraines   Hot flashes, menopausal    HPV (human papilloma virus) infection    Lung nodule, multiple    Osteopenia    Seizures ([place])    unknown reason for seizures- last seizurein Sept 2012   TIA (transient ischemic attack)     Past Surgical History:  Procedure Laterality Date   ABDOMINOPLASTY     LAPAROSCOPY  01/04/2011   Procedure: LAPAROSCOPY OPERATIVE;  Surgeon: Juliene Pina C. Hulan Fray, MD;  Location: [facility];  Service: Gynecology;  Laterality: N/A;   LAPAROSCOPY  06/14   gall bladder and 1/2 liver   SALPINGOOPHORECTOMY  01/04/2011   Procedure: SALPINGO OOPHERECTOMY;  Surgeon: Myra C. Hulan Fray, MD;  Location: [facility];  Service: Gynecology;  Laterality: Bilateral;   TOTAL VAGINAL HYSTERECTOMY  61 yrs old     PHYSICAL EXAM:  VS: BP 120/78 (BP  Location: Left Arm, Patient Position: Sitting)   Ht _0  (1.702 m)   Wt 160 lb (72.6 kg)   BMI 25.06 kg/m  Physical Exam Gen: NAD, alert, cooperative with exam, well-appearing MSK:  Neurovascularly intact     Limited ultrasound: Left lower leg and ankle:  Normal-appearing ankle joint. No changes of the distal fibula. Normal ATFL. Normal insertional peroneal brevis into the base of fifth metatarsal. Normal cuboid. Significant hyperemia over the distal lateral tibia to suggest a nondisplaced fracture in this area.  Summary: Findings most consistent with a nondisplaced distal tibia fracture.  Ultrasound and interpretation by Clearance Coots, MD   ASSESSMENT & PLAN:   Closed nondisplaced transverse fracture of shaft of left tibia Acutely occurring.  Initial injury on 6/23.  Symptoms more consistent with a fracture of the tibia based on the ultrasound amount of pain she is having with the cam walker. -Counseled on home exercise therapy and supportive care -Provided crutches. -Roxicodone. -Would consider updated bone density and consider osteoporosis management. -Could consider further imaging. -Counseled on CAM Walker.

## 2021-10-13 NOTE — Assessment & Plan Note (Signed)
Acutely occurring.  Initial injury on 6/23.  Symptoms more consistent with a fracture of the tibia based on the ultrasound amount of pain she is having with the cam walker. -Counseled on home exercise therapy and supportive care -Provided crutches. -Roxicodone. -Would consider updated bone density and consider osteoporosis management. -Could consider further imaging. -Counseled on CAM Walker.

## 2021-10-13 NOTE — Patient Instructions (Signed)
Nice to meet you Please use the pain medicine as needed. Please use the crutches and stay off of the leg. I will call with the x-ray results. Please send me a message in MyChart with any questions or updates.  Please follow-up with Dr. Darene Lamer as scheduled.   --Dr. Raeford Razor

## 2021-10-18 ENCOUNTER — Telehealth: Payer: Self-pay | Admitting: Family Medicine

## 2021-10-18 NOTE — Telephone Encounter (Signed)
Informed of results.   Rosemarie Ax, MD [facility] 10/18/2021, 10:47 AM

## 2021-10-20 ENCOUNTER — Ambulatory Visit: Payer: Medicare Other | Admitting: Sports Medicine

## 2021-10-20 ENCOUNTER — Ambulatory Visit (INDEPENDENT_AMBULATORY_CARE_PROVIDER_SITE_OTHER): Payer: Medicare Other | Admitting: Sports Medicine

## 2021-10-20 DIAGNOSIS — M25551 Pain in right hip: Secondary | ICD-10-CM | POA: Diagnosis not present

## 2021-10-20 DIAGNOSIS — G8929 Other chronic pain: Secondary | ICD-10-CM

## 2021-10-20 DIAGNOSIS — M25552 Pain in left hip: Secondary | ICD-10-CM

## 2021-10-20 DIAGNOSIS — S82832D Other fracture of upper and lower end of left fibula, subsequent encounter for closed fracture with routine healing: Secondary | ICD-10-CM | POA: Diagnosis not present

## 2021-10-20 MED ORDER — OXYCODONE HCL 5 MG PO TABS
5.0000 mg | ORAL_TABLET | ORAL | 0 refills | Status: DC | PRN
Start: 2021-10-20 — End: 2021-12-20

## 2021-10-20 NOTE — Assessment & Plan Note (Signed)
Pleasant 61 year old female, due to misstep, injured left ankle, was seen in urgent care where x-rays were read as negative, saw Dr. Raeford Razor, it looks like he did not ultrasound and suspected a tibial fracture. Now 2 weeks later she is in a boot, she has no tenderness at the tibia but she does have significant tenderness over the distal fibular shaft, on review of her x-rays she does have some lucency through the distal fibular shaft, I will go ahead and call this a distal fibular fracture, continue boot for at least another month. We will need to hold off on steroid injections for now.

## 2021-10-20 NOTE — Progress Notes (Signed)
    Procedures performed today:    None.  Independent interpretation of notes and tests performed by another provider:   X-rays personally reviewed, I do see a lucency through the distal fibular shaft proximal to the malleolus  Brief History, Exam, Impression, and Recommendations:    Fracture of fibula, left, closed Pleasant 61 year old female, due to misstep, injured left ankle, was seen in urgent care where x-rays were read as negative, saw Dr. Raeford Razor, it looks like he did not ultrasound and suspected a tibial fracture. Now 2 weeks later she is in a boot, she has no tenderness at the tibia but she does have significant tenderness over the distal fibular shaft, on review of her x-rays she does have some lucency through the distal fibular shaft, I will go ahead and call this a distal fibular fracture, continue boot for at least another month. We will need to hold off on steroid injections for now.   Chronic hip pain, bilateral Injury also has chronic bilateral hip pain, known osteoarthritis and bilateral trochanteric bursitis, L4-5 lumbar spinal stenosis all which contributed to hip pain. Last injection was March 2023, we need to hold off on steroid injections with her current fracture. We will do some oxycodone at night to help her through the pain.    ____________________________________________ Gwen Her. Dianah Field, M.D., ABFM., CAQSM., AME. Primary Care and Sports Medicine [facility]  Adjunct Professor of [employer]  Risk manager

## 2021-10-20 NOTE — Assessment & Plan Note (Signed)
Injury also has chronic bilateral hip pain, known osteoarthritis and bilateral trochanteric bursitis, L4-5 lumbar spinal stenosis all which contributed to hip pain. Last injection was March 2023, we need to hold off on steroid injections with her current fracture. We will do some oxycodone at night to help her through the pain.

## 2021-10-22 ENCOUNTER — Other Ambulatory Visit: Payer: Self-pay | Admitting: Medical-Surgical

## 2021-10-26 ENCOUNTER — Encounter: Payer: Self-pay | Admitting: Pulmonary Disease

## 2021-10-26 ENCOUNTER — Ambulatory Visit (INDEPENDENT_AMBULATORY_CARE_PROVIDER_SITE_OTHER): Payer: Medicare Other | Admitting: Pulmonary Disease

## 2021-10-26 VITALS — BP 104/56 | HR 64 | Temp 98.4°F | Ht 67.0 in | Wt 156.8 lb

## 2021-10-26 DIAGNOSIS — J449 Chronic obstructive pulmonary disease, unspecified: Secondary | ICD-10-CM | POA: Diagnosis not present

## 2021-10-26 DIAGNOSIS — Z720 Tobacco use: Secondary | ICD-10-CM | POA: Diagnosis not present

## 2021-10-26 MED ORDER — TRELEGY ELLIPTA 200-62.5-25 MCG/ACT IN AEPB
1.0000 | INHALATION_SPRAY | Freq: Every day | RESPIRATORY_TRACT | 5 refills | Status: DC
Start: 2021-10-26 — End: 2022-11-28

## 2021-10-26 MED ORDER — TRELEGY ELLIPTA 200-62.5-25 MCG/ACT IN AEPB: 1.0000 | INHALATION_SPRAY | Freq: Every day | RESPIRATORY_TRACT | 0 refills | Status: DC

## 2021-10-26 NOTE — Patient Instructions (Signed)
COPD with panlobular emphysema- uncontrolled --STOP Breztri --START Trelegy 200 ONE puff ONCE a day --CONTINUE Albuterol as needed for shortness of breath or wheezing. REFILL --ENCOURAGE regular activity including aerobic activity  Tobacco abuse Patient is an active smoker. Not interested in quitting  Subcentimeter pulmonary nodules-high risk given ongoing tobacco abuse and history of cholangiocarcinoma. We reviewed CT lung screen. Largest nodule 3.3 mm -CONTINUE lung screen program  Follow-up with me in 3 months

## 2021-10-26 NOTE — Progress Notes (Signed)
Synopsis: Referred in October 2020 for COPD by Erin Bouche, NP.   Subjective:   PATIENT ID: Erin [name] GENDER: female DOB: 09/04/1960, MRN: [id number]  Chief Complaint  Patient presents with   Follow-up    Still can't stop smoking. Breathing is about the same.   Ms. Erin [name] is a 61 year old female active smoker with COPD, history of cholangiocarcinoma with liver involvement s/p cholecystectomy and partial liver resection who presents for follow-up.  Synopsis: She was previously followed by Dr. Carlis Abbott since 01/2019. She established care with me in 07/2020. She is compliant on Breztri. Rinses her mouth with each use but concerned about getting thrush. Also states inhaler has metallic taste. At baseline, she reports heavy breathing at rest and exertion. Has occasional cough and wheeze during the day. No nighttime symptoms. She has no limitations in activity with her symptoms. She remains an active smoker and continues to due to stress with her family including her children and grandchildren. She is not interested in quitting smoking. She expresses concern about not receiving her CT lung results. Denies recent exacerbations needing steroids or antibiotics.  11/25/20 She reports that she is having shortness of breath at baseline. She has wheezing with activity. She has social stressors with her daughter and grandson. She currently smokes 1ppd. She is compliant with her Breztri.   02/08/21 She has been compliant with Breztri daily. She has chronic cough with smoking. Has wheezing during the day. Does not awaken her at night. No exacerbations since our last visit. However does use her albuterol three times daily. She has many social stressors. Her apartment complex has recently had many arrests and safety is a concern. She is looking for a new home.  05/24/21 Since our last visit she reports overall her breathing has worsened. She is compliant with her Judithann Sauger and noticed worsening symptoms  when off of it. She uses albuterol three times a day for shortness of breath. Her cough is mainly dry but does have production in morning. She is actively smoking 1/2 ppd. She is planning to start walking daily as the weather improves. She has issues with bilious vomiting and frustrated with her PCP.   10/26/21 Since our last visit she reports breathing is worsened with cleaning and going to the store. She has shortness of breath and wheezing on exertion. Occasional nonproductive cough. She is compliant with Breztri and using albuterol three times a day. She has increased smoking 1ppd due to family issues. She recently broke her left foot and has been limited in activity.  Social Hx: Currently smokes 1 ppd. 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  Past Medical History:  Diagnosis Date   Anxiety    Arthritis    hands and feet   ASCUS (atypical squamous cells of undetermined significance) on Pap smear    Blood transfusion 1983   In [place]   BRCA2 gene mutation positive 2014   Bursitis    Cancer ([place])    liver   Chronic constipation    Condyloma    COPD (chronic obstructive pulmonary disease) ([place])    Depression    per pt h/o depression- no meds currently. Dx of bi-polar depression in EPIC   Headache(784.0)    migraines   Hot flashes, menopausal    HPV (human papilloma virus) infection    Lung nodule, multiple    Osteopenia    Seizures ([place])    unknown reason for seizures- last seizurein  Sept 2012   TIA (transient ischemic attack)      Allergies  Allergen Reactions   Bee Venom Anaphylaxis   Dicyclomine Shortness Of Breath    SHOB, swelling of tongue   Butalbital-Apap-Caffeine Other (See Comments)    Abd. pain   Singulair  [Montelukast Sodium] Other (See Comments)    "Sinus infection for months"   Ciprofloxacin Nausea And Vomiting   Tylenol [Acetaminophen] Other (See Comments)    Can not take due to patient's  liver cancer and resection.   Amoxicillin Diarrhea    Severe diarrhea   Lyrica [Pregabalin] Itching     Immunization History  Administered Date(s) Administered   Influenza,inj,Quad PF,6+ Mos 02/03/2019, 01/01/2020   PFIZER(Purple Top)SARS-COV-2 Vaccination 07/16/2019, 08/10/2019, 01/16/2020   Pneumococcal Polysaccharide-23 02/03/2015   Tdap 05/08/2010, 07/31/2021   Zoster Recombinat (Shingrix) 07/31/2021   Zoster, Live 01/04/2014    Outpatient Medications Prior to Visit  Medication Sig Dispense Refill   albuterol (PROVENTIL HFA) 108 (90 Base) MCG/ACT inhaler Inhale 1-2 puffs into the lungs every 6 (six) hours as needed for wheezing or shortness of breath. 18 g 11   BREZTRI AEROSPHERE 160-9-4.8 MCG/ACT AERO Inhale 2 puffs into the lungs in the morning and at bedtime. 10.7 g 11   clonazePAM (KLONOPIN) 0.5 MG tablet Take 0.5-1 tablets (0.25-0.5 mg total) by mouth daily as needed for anxiety. 20 tablet 1   DULoxetine (CYMBALTA) 30 MG capsule Take 1 capsule (30 mg total) by mouth daily. 90 capsule 3   LINZESS 145 MCG CAPS capsule TAKE 1 CAPSULE(145 MCG) BY MOUTH DAILY 30 capsule 0   omeprazole (PRILOSEC) 40 MG capsule Take 1 capsule (40 mg total) by mouth daily. 90 capsule 3   ondansetron (ZOFRAN ODT) 8 MG disintegrating tablet Take 1 tablet (8 mg total) by mouth every 8 (eight) hours as needed for nausea or vomiting. 24 tablet 0   oxyCODONE (OXY IR/ROXICODONE) 5 MG immediate release tablet Take 1 tablet (5 mg total) by mouth every 4 (four) hours as needed for severe pain. 30 tablet 0   traZODone (DESYREL) 100 MG tablet TAKE 1 TABLET(100 MG) BY MOUTH AT BEDTIME AS NEEDED FOR SLEEP 30 tablet 1   triamcinolone (KENALOG) 0.025 % cream Apply 1 Application topically 3 (three) times daily as needed. 160 g 0   No facility-administered medications prior to visit.    Review of Systems  Constitutional:  Negative for chills, diaphoresis, fever, malaise/fatigue and weight loss.  HENT:  Negative for  congestion.   Respiratory:  Positive for cough, shortness of breath and wheezing. Negative for hemoptysis and sputum production.   Cardiovascular:  Negative for chest pain, palpitations and leg swelling.     Objective:   Vitals:   10/26/21 0916  BP: (!) 104/56  Pulse: 64  Temp: 98.4 F (36.9 C)  TempSrc: Oral  SpO2: 96%  Weight: 156 lb 12.8 oz (71.1 kg)  Height: $Remove'5\' 7"'fUIzxHe$  (1.702 m)   Physical Exam: General: Well-appearing, no acute distress HENT: [place], AT Eyes: EOMI, no scleral icterus Respiratory: Diminished but clear to auscultation bilaterally.  No crackles, wheezing or rales Cardiovascular: RRR, -M/R/G, no JVD Extremities:-Edema,-tenderness Neuro: AAO x4, CNII-XII grossly intact Psych: Normal mood, normal affect  Chest Imaging- films reviewed: CT C-spine 08/04/2015- lung images reviewed- panlobular emphysema, apical scarring  CT chest 03/11/2018-report reviewed-emphysema, apical scarring, small lung nodules, the largest being 5 mm in diameter.  No mediastinal or hilar adenopathy.  Postsurgical changes in the right hepatic lobe and small hypodensities in  the left hepatic lobe-stable.  Per CT report as far back as May 2014 there has been a right upper lobe 5 mm nodule.  CT chest 02/24/2019- severe centrilobular emphysema.  Nodule in RUL, RML, lingula, LLL.  No significant mediastinal or hilar adenopathy.  4 cm ascending thoracic aortic aneurysm.  CT Chest 04/29/20 - moderate centrilobular emphysema. Stable 5m LUL and LLL.  CT Chest Lung Screen 08/24/20 - moderate bullous and centrilobular emphysema.   CT Chest Lung Screen 09/20/21 - Centrilobular and paraseptal emphysema. Unchanged nodules  Pulmonary Functions Testing Results:  [facility](no standards available for comparison of FEV1 and FVC)- both read as moderate obstruction  03/17/2018: FVC 2.37 L FEV1 1.25 L Ratio 53%  04/21/2018: FVC 2.85 L FEV1 1.57 L Ratio 55%  10/20/2018: FVC 3.29 L FEV1 1.89  L Ratio 57%  02/08/21 FVC 3.02 (81%) FEV1 1.59 (55%) Ratio 50  TLC 103% DLCO 55% Interpretation: Moderately severe obstructive defect with air trapping and decreased DLCO  10/30/2016: 6-minute walk test-97% on room air at rest, desaturated to 90% on room air during ambulation and recovered to 93% on room air by the end of the test.  Ambulated 660 feet.  [facility] 2014 liver masses- cholangiocarcinoma  Echocardiogram 08/03/2016: Outside report reviewed LVEF 60 to 65%, no regional wall motion abnormalities, normal diastolic function.  Normal LA, RV, RA.  Mild AR, trace MR    Assessment & Plan:   61year old female active smoker with COPD with emphysema who presents for follow-up. Reports worsening respiratory symptoms with exertion. Discussed trial of Trelegy. Will need to consider nebulizer therapy if this is ineffective. Discussed increasing aerobic activity when able  COPD with panlobular emphysema- uncontrolled --STOP Breztri --START Trelegy 200 ONE puff ONCE a day --CONTINUE Albuterol as needed for shortness of breath or wheezing. REFILL --ENCOURAGE regular activity including aerobic activity  Tobacco abuse Patient is an active smoker. Not interested in quitting  Subcentimeter pulmonary nodules-high risk given ongoing tobacco abuse and history of cholangiocarcinoma. We reviewed CT lung screen. Largest nodule 3.3 mm -CONTINUE lung screen program  Health Maintenance Immunization History  Administered Date(s) Administered   Influenza,inj,Quad PF,6+ Mos 02/03/2019, 01/01/2020   PFIZER(Purple Top)SARS-COV-2 Vaccination 07/16/2019, 08/10/2019, 01/16/2020   Pneumococcal Polysaccharide-23 02/03/2015   Tdap 05/08/2010, 07/31/2021   Zoster Recombinat (Shingrix) 07/31/2021   Zoster, Live 01/04/2014   No orders of the defined types were placed in this encounter.   No orders of the defined types were placed in this encounter.  No follow-ups on file.   I have  spent a total time of 38-minutes on the day of the appointment reviewing prior documentation, coordinating care and discussing medical diagnosis and plan with the patient/family. Past medical history, allergies, medications were reviewed. Pertinent imaging, labs and tests included in this note have been reviewed and interpreted independently by me.  Maccoy Haubner JRodman Pickle MD [facility] 10/26/2021 9:37 AM

## 2021-11-17 ENCOUNTER — Telehealth: Payer: Self-pay | Admitting: Medical-Surgical

## 2021-11-17 ENCOUNTER — Ambulatory Visit (INDEPENDENT_AMBULATORY_CARE_PROVIDER_SITE_OTHER): Payer: Medicare Other | Admitting: Sports Medicine

## 2021-11-17 ENCOUNTER — Ambulatory Visit (INDEPENDENT_AMBULATORY_CARE_PROVIDER_SITE_OTHER): Payer: Medicare Other

## 2021-11-17 DIAGNOSIS — G8929 Other chronic pain: Secondary | ICD-10-CM

## 2021-11-17 DIAGNOSIS — M25551 Pain in right hip: Secondary | ICD-10-CM | POA: Diagnosis not present

## 2021-11-17 DIAGNOSIS — M25552 Pain in left hip: Secondary | ICD-10-CM

## 2021-11-17 DIAGNOSIS — S82832D Other fracture of upper and lower end of left fibula, subsequent encounter for closed fracture with routine healing: Secondary | ICD-10-CM

## 2021-11-17 NOTE — Assessment & Plan Note (Addendum)
Now is 68 weeks post left ankle injury with pain lateral fibula, initially she had some pain at the distal fibular shaft, and I did see a lucency on her x-rays, she has now been in a boot, less pain at the distal fibula but increasing pain at the junction of the middle third and the distal thirds. Considering greater than 6 weeks of conservative treatment, immobilization, with persistent pain we are going to proceed with tib-fib MRI to evaluate for a malunion/nonunion versus stress injury. Okay to discontinue boot for now.

## 2021-11-17 NOTE — Progress Notes (Signed)
    Procedures performed today:    Procedure: Real-time Ultrasound Guided injection of the left greater trochanteric bursa Device: Samsung HS60  Verbal informed consent obtained.  Time-out conducted.  Noted no overlying erythema, induration, or other signs of local infection.  Skin prepped in a sterile fashion.  Local anesthesia: Topical Ethyl chloride.  With sterile technique and under real time ultrasound guidance:  Noted normal-appearing hip abductors, 1 cc Kenalog 40, 2 cc lidocaine, 2 cc bupivacaine injected easily Completed without difficulty  Advised to call if fevers/chills, erythema, induration, drainage, or persistent bleeding.  Images permanently stored and available for review in PACS.  Impression: Technically successful ultrasound guided injection.   Procedure: Real-time Ultrasound Guided injection of the right greater trochanteric bursa Device: Samsung HS60  Verbal informed consent obtained.  Time-out conducted.  Noted no overlying erythema, induration, or other signs of local infection.  Skin prepped in a sterile fashion.  Local anesthesia: Topical Ethyl chloride.  With sterile technique and under real time ultrasound guidance:  Noted normal-appearing hip abductors, 1 cc Kenalog 40, 2 cc lidocaine, 2 cc bupivacaine injected easily Completed without difficulty  Advised to call if fevers/chills, erythema, induration, drainage, or persistent bleeding.  Images permanently stored and available for review in PACS.  Impression: Technically successful ultrasound guided injection.  Independent interpretation of notes and tests performed by another provider:   None.  Brief History, Exam, Impression, and Recommendations:    Chronic hip pain, bilateral Injury is a pleasant 61 year old female with chronic bilateral multifactorial hip pain with known hip osteoarthritis, bilateral trochanteric bursitis, as well as L4-L5 lumbar spinal stenosis all contributing to the pain, last  injection March 2023, her fracture is healed, so we will proceed with bilateral injections.  Fracture of fibula, left, closed Now is 68 weeks post left ankle injury with pain lateral fibula, initially she had some pain at the distal fibular shaft, and I did see a lucency on her x-rays, she has now been in a boot, less pain at the distal fibula but increasing pain at the junction of the middle third and the distal thirds. Considering greater than 6 weeks of conservative treatment, immobilization, with persistent pain we are going to proceed with tib-fib MRI to evaluate for a malunion/nonunion versus stress injury. Okay to discontinue boot for now.  Chronic process with exacerbation and pharmacologic intervention  ____________________________________________ Gwen Her. Dianah Field, M.D., ABFM., CAQSM., AME. Primary Care and Sports Medicine [place] [facility]  Adjunct Professor of [place] of [facility] of Medicine  Risk manager

## 2021-11-17 NOTE — Assessment & Plan Note (Signed)
Injury is a pleasant 61 year old female with chronic bilateral multifactorial hip pain with known hip osteoarthritis, bilateral trochanteric bursitis, as well as L4-L5 lumbar spinal stenosis all contributing to the pain, last injection March 2023, her fracture is healed, so we will proceed with bilateral injections.

## 2021-11-17 NOTE — Telephone Encounter (Signed)
Patient would like to switch care to Dr. Elmo Putt. She states she would like to have a physician not a NP as her provider. Is the switch ok  - CF

## 2021-11-18 ENCOUNTER — Ambulatory Visit (INDEPENDENT_AMBULATORY_CARE_PROVIDER_SITE_OTHER): Payer: Medicare Other

## 2021-11-18 DIAGNOSIS — S82832D Other fracture of upper and lower end of left fibula, subsequent encounter for closed fracture with routine healing: Secondary | ICD-10-CM

## 2021-11-21 ENCOUNTER — Other Ambulatory Visit: Payer: Self-pay | Admitting: Medical-Surgical

## 2021-11-22 ENCOUNTER — Ambulatory Visit (INDEPENDENT_AMBULATORY_CARE_PROVIDER_SITE_OTHER): Payer: Medicare Other | Admitting: Sports Medicine

## 2021-11-22 ENCOUNTER — Telehealth: Payer: Self-pay

## 2021-11-22 DIAGNOSIS — S82832D Other fracture of upper and lower end of left fibula, subsequent encounter for closed fracture with routine healing: Secondary | ICD-10-CM | POA: Diagnosis not present

## 2021-11-22 NOTE — Telephone Encounter (Signed)
Erin [name] called and reports a problem with the cast. She states the cast is rubbing against her 5 th toe. She states she needs a new cast placed. No openings until Monday. She would like to be worked in. Please advise.

## 2021-11-22 NOTE — Progress Notes (Signed)
    Procedures performed today:    I applied a short leg walking cast  Independent interpretation of notes and tests performed by another provider:   None.  Brief History, Exam, Impression, and Recommendations:    Fracture of fibula, left, closed Chasiti returns, she is a 61 year old female, at the last visit on the fourth she was 6 to 8 weeks post left ankle injury with pain lateral fibula, x-rays were read as negative, I did see a lucency through the fibula.  We obtained an MRI that confirms transverse fracture through the fibula and stress injury tibia, she has been in a boot intermittently. Transitioning to a short leg walking cast. She will wear this for a month.    ____________________________________________ Gwen Her. Dianah Field, M.D., ABFM., CAQSM., AME. Primary Care and Sports Medicine  [facility]  Adjunct Professor of [place] of [facility] of Medicine  Risk manager

## 2021-11-22 NOTE — Assessment & Plan Note (Signed)
Erin [name], she is a 61 year old female, at the last visit on the fourth she was 6 to 8 weeks post left ankle injury with pain lateral fibula, x-rays were read as negative, I did see a lucency through the fibula.  We obtained an MRI that confirms transverse fracture through the fibula and stress injury tibia, she has been in a boot intermittently. Transitioning to a short leg walking cast. She will wear this for a month.

## 2021-11-22 NOTE — Telephone Encounter (Signed)
Just have her stick some gauze in there or put a Band-Aid over the fifth toe and give it some time.  I am not working her in when I tried to discourage her from wearing a cast and just do a boot anyway throughout the whole appointment.

## 2021-11-23 NOTE — Telephone Encounter (Signed)
Patient advised. She states she will fix it herself and hung up the phone.

## 2021-11-25 ENCOUNTER — Other Ambulatory Visit: Payer: Self-pay | Admitting: Medical-Surgical

## 2021-12-01 ENCOUNTER — Other Ambulatory Visit: Payer: Self-pay

## 2021-12-01 MED ORDER — DULOXETINE HCL 30 MG PO CPEP: 30.0000 mg | ORAL_CAPSULE | Freq: Every day | ORAL | 3 refills | Status: DC

## 2021-12-06 ENCOUNTER — Encounter: Payer: Self-pay | Admitting: General Practice

## 2021-12-19 NOTE — Progress Notes (Unsigned)
     Established patient visit   Patient: Erin [name]   DOB: 03-12-1961   61 y.o. Female  MRN: [id number] Visit Date: 12/20/2021  Today's healthcare provider: Owens Loffler, DO   No chief complaint on file.   SUBJECTIVE   No chief complaint on file.  HPI    Review of Systems     No outpatient medications have been marked as taking for the 12/20/21 encounter (Appointment) with Owens Loffler, DO.    OBJECTIVE    There were no vitals taken for this visit.  Physical Exam   {Show previous labs (optional):23736}    ASSESSMENT & PLAN    Problem List Items Addressed This Visit   None   No follow-ups on file.      No orders of the defined types were placed in this encounter.   No orders of the defined types were placed in this encounter.    Owens Loffler, DO  [facility] [phone] (phone) [phone] (fax)  [place]

## 2021-12-20 ENCOUNTER — Ambulatory Visit (INDEPENDENT_AMBULATORY_CARE_PROVIDER_SITE_OTHER): Payer: Medicare Other | Admitting: Family Medicine

## 2021-12-20 VITALS — BP 147/88 | HR 69 | Ht 67.0 in | Wt 148.0 lb

## 2021-12-20 DIAGNOSIS — F411 Generalized anxiety disorder: Secondary | ICD-10-CM | POA: Diagnosis not present

## 2021-12-20 DIAGNOSIS — Z23 Encounter for immunization: Secondary | ICD-10-CM | POA: Diagnosis not present

## 2021-12-20 DIAGNOSIS — Z0000 Encounter for general adult medical examination without abnormal findings: Secondary | ICD-10-CM

## 2021-12-20 MED ORDER — ESCITALOPRAM OXALATE 5 MG PO TABS: 5.0000 mg | ORAL_TABLET | Freq: Every day | ORAL | 3 refills | Status: DC

## 2021-12-20 MED ORDER — ESCITALOPRAM OXALATE 10 MG PO TABS: 10.0000 mg | ORAL_TABLET | Freq: Every day | ORAL | 2 refills | Status: DC

## 2021-12-20 NOTE — Assessment & Plan Note (Addendum)
-   thoroughly discussed anxiety and depression and is very uncontrolled - she is seeing therapy and says it is helping - discussed lexapro '5mg'$  and pt is open to starting this. She has worked hard to lose weight and has been doing well. Discussed that lexapro is weight neutral and would be a good option for her  - discussed that lexapro can interact with trazodone and cymbalta and gave side effects for pt to be aware of. Discussed with pharmacist and it would be best to start her at the '5mg'$  lexapro dosing.  Encouraged her to take lexapro in the am since she takes the trazodone '150mg'$  at night.  - will do a video visit in one month to check efficacy of lexapro for anxiety.  - if lexapro does not work at next visit will consider getting off cymbalta and lexapro and starting effexor

## 2021-12-20 NOTE — Addendum Note (Signed)
Addended by: Owens Loffler on: 12/20/2021 09:51 AM   Modules accepted: Orders

## 2021-12-20 NOTE — Addendum Note (Signed)
Addended by: Cline Crock on: 12/20/2021 03:46 PM   Modules accepted: Orders

## 2021-12-27 ENCOUNTER — Telehealth: Payer: Self-pay

## 2021-12-27 ENCOUNTER — Ambulatory Visit: Payer: Medicare Other | Admitting: Medical-Surgical

## 2021-12-27 NOTE — Telephone Encounter (Signed)
Pt called stating that the bag over her cast tore while she was in the shower and her cast got completely wet.  She is scheduled to RTC on 01/03/22.  Does she need to be seen sooner d/t getting her cast wet?  Please advise.  Charyl Bigger, CMA

## 2021-12-27 NOTE — Telephone Encounter (Signed)
If it got wet and if it still somewhat wet then yes probably good idea to take it off and put a new one on.  If it has completely dried out then it is okay.

## 2021-12-29 ENCOUNTER — Ambulatory Visit (INDEPENDENT_AMBULATORY_CARE_PROVIDER_SITE_OTHER): Payer: Medicare Other | Admitting: Sports Medicine

## 2021-12-29 DIAGNOSIS — S82832D Other fracture of upper and lower end of left fibula, subsequent encounter for closed fracture with routine healing: Secondary | ICD-10-CM

## 2021-12-29 NOTE — Telephone Encounter (Signed)
Pt states that the heel of her cast is "mushy".  Per Dr. Darene Lamer, pt to come to the office ASAP and he will remove cast early.  Pt informed.  She will be here at [facility] [place], CMA

## 2021-12-29 NOTE — Progress Notes (Signed)
    Procedures performed today:    None.  Independent interpretation of notes and tests performed by another provider:   None.  Brief History, Exam, Impression, and Recommendations:    Fracture of fibula, left, closed Erin [name] returns, she is a 61 year old female, she continued to have pain over her fibula, x-rays were negative, ultimately we obtained an MRI that confirmed a transverse fracture through the fibula and a stress fracture of the tibia. She had intermittently been in the boot so we placed a walking cast, she returns today completely pain-free and eager to get the cast off. She has been in the cast now for almost 6 weeks. Cast is removed, she is nontender over the fracture sites, she should do some gentle range of motion exercises and return to see me as needed.   ____________________________________________ Gwen Her. Dianah Field, M.D., ABFM., CAQSM., AME. Primary Care and Sports Medicine [place] [facility]  Adjunct Professor of [place] of [facility] of Medicine  Risk manager

## 2021-12-29 NOTE — Assessment & Plan Note (Signed)
Erin [name] returns, she is a 61 year old female, she continued to have pain over her fibula, x-rays were negative, ultimately we obtained an MRI that confirmed a transverse fracture through the fibula and a stress fracture of the tibia. She had intermittently been in the boot so we placed a walking cast, she returns today completely pain-free and eager to get the cast off. She has been in the cast now for almost 6 weeks. Cast is removed, she is nontender over the fracture sites, she should do some gentle range of motion exercises and return to see me as needed.

## 2022-01-01 ENCOUNTER — Ambulatory Visit: Payer: Medicare Other | Admitting: Sports Medicine

## 2022-01-03 ENCOUNTER — Ambulatory Visit: Payer: Medicare Other | Admitting: Sports Medicine

## 2022-01-15 ENCOUNTER — Encounter: Payer: Self-pay | Admitting: Sports Medicine

## 2022-01-15 ENCOUNTER — Ambulatory Visit (INDEPENDENT_AMBULATORY_CARE_PROVIDER_SITE_OTHER): Payer: Medicare Other

## 2022-01-15 ENCOUNTER — Ambulatory Visit (INDEPENDENT_AMBULATORY_CARE_PROVIDER_SITE_OTHER): Payer: Medicare Other | Admitting: Sports Medicine

## 2022-01-15 DIAGNOSIS — S82832D Other fracture of upper and lower end of left fibula, subsequent encounter for closed fracture with routine healing: Secondary | ICD-10-CM | POA: Diagnosis not present

## 2022-01-15 DIAGNOSIS — S83207A Unspecified tear of unspecified meniscus, current injury, left knee, initial encounter: Secondary | ICD-10-CM

## 2022-01-15 NOTE — Progress Notes (Signed)
    Procedures performed today:    Procedure: Real-time Ultrasound Guided injection of the left knee Device: Samsung HS60  Verbal informed consent obtained.  Time-out conducted.  Noted no overlying erythema, induration, or other signs of local infection.  Skin prepped in a sterile fashion.  Local anesthesia: Topical Ethyl chloride.  With sterile technique and under real time ultrasound guidance: Mild effusion, 1 cc Kenalog 40, 2 cc lidocaine, 2 cc bupivacaine injected easily Completed without difficulty  Advised to call if fevers/chills, erythema, induration, drainage, or persistent bleeding.  Images permanently stored and available for review in PACS.  Impression: Technically successful ultrasound guided injection.  Independent interpretation of notes and tests performed by another provider:   None.  Brief History, Exam, Impression, and Recommendations:    Fracture of fibula, left, closed Erin [name] returns, she continues to do well after an MRI confirmed a fracture of her fibula and a stress fracture through the tibia. We did 6 weeks of cast immobilization and she continues to do well.   Acute meniscal tear of left knee MRI did show suspicion for a radial tear of the medial meniscus, she was doing well from a knee standpoint until taking a misstep and almost falling recently at the grocery store. Now she has tenderness medial joint line. I do suspect it is coming from her medial meniscus, overall good motion and good strength. I injected her left knee today, we will do a reaction knee brace, return to see me in about 6 weeks.    ____________________________________________ Gwen Her. Dianah Field, M.D., ABFM., CAQSM., AME. Primary Care and Sports Medicine [place] [facility]  Adjunct Professor of [place] of [facility] of Medicine  Risk manager

## 2022-01-15 NOTE — Assessment & Plan Note (Signed)
MRI did show suspicion for a radial tear of the medial meniscus, she was doing well from a knee standpoint until taking a misstep and almost falling recently at the grocery store. Now she has tenderness medial joint line. I do suspect it is coming from her medial meniscus, overall good motion and good strength. I injected her left knee today, we will do a reaction knee brace, return to see me in about 6 weeks.

## 2022-01-15 NOTE — Assessment & Plan Note (Signed)
Erin [name] returns, she continues to do well after an MRI confirmed a fracture of her fibula and a stress fracture through the tibia. We did 6 weeks of cast immobilization and she continues to do well.

## 2022-01-17 ENCOUNTER — Telehealth: Payer: Self-pay | Admitting: General Practice

## 2022-01-17 ENCOUNTER — Telehealth: Payer: Medicare Other | Admitting: Family Medicine

## 2022-01-17 ENCOUNTER — Other Ambulatory Visit: Payer: Self-pay

## 2022-01-17 MED ORDER — TRAZODONE HCL 100 MG PO TABS: ORAL_TABLET | ORAL | 0 refills | Status: DC

## 2022-01-17 NOTE — Telephone Encounter (Signed)
Transition Care Management Follow-up Telephone Call Date of discharge and from where: 01/15/22 from [facility] How have you been since you were released from the hospital? Patient had OV with Dr. Darene Lamer on 01/16/22. Any questions or concerns? No

## 2022-01-22 ENCOUNTER — Encounter: Payer: Self-pay | Admitting: Family Medicine

## 2022-01-22 ENCOUNTER — Telehealth (INDEPENDENT_AMBULATORY_CARE_PROVIDER_SITE_OTHER): Payer: Medicare Other | Admitting: Family Medicine

## 2022-01-22 VITALS — Wt 140.0 lb

## 2022-01-22 DIAGNOSIS — R239 Unspecified skin changes: Secondary | ICD-10-CM

## 2022-01-22 DIAGNOSIS — E782 Mixed hyperlipidemia: Secondary | ICD-10-CM | POA: Diagnosis not present

## 2022-01-22 DIAGNOSIS — F411 Generalized anxiety disorder: Secondary | ICD-10-CM

## 2022-01-22 NOTE — Assessment & Plan Note (Signed)
-   ordered Lipid panel  - last draw was 6 months ago and pt is unable to tolerate statins due to myalgias. Will continue to monitor levels believe she has genetic component since she admits to a low cholesterol diet.

## 2022-01-22 NOTE — Assessment & Plan Note (Signed)
-   pt says she is feeling better on the lexapro '5mg'$  and would like to stay at this dose - repeat GAD7: 10 and PHQ9:8 but patient says she has always been wanting to stay away from people.  - since she notes personal improvement I will keep her on '5mg'$  lexapro

## 2022-01-22 NOTE — Assessment & Plan Note (Signed)
-   on video visit, pt says that she was talking to her sister in [place] and she noticed her own skin was "yellow" compared to her sisters. Video was blurry so I have ordered labs to rule out causes of jaundice such as CMP, hepatitis panel.  - will schedule a follow up with patient once blood work results to discuss

## 2022-01-22 NOTE — Progress Notes (Signed)
Established patient visit   Patient: Erin [name]   DOB: 28-Jun-1960   61 y.o. Female  MRN: [id number] Visit Date: 01/22/2022  Today's healthcare provider: Owens Loffler, DO   Chief Complaint  Patient presents with   Anxiety   I connected with  Erin [name] on 01/22/22 by a video and audio enabled telemedicine application and verified that I am speaking with the correct person using two identifiers.  Patient Location: Home  Provider Location: Office/Clinic  I discussed the limitations of evaluation and management by telemedicine. The patient expressed understanding and agreed to proceed.  SUBJECTIVE    Chief Complaint  Patient presents with   Anxiety   HPI  Pt presents via audio visual telemedicine application for follow up. She was placed on lexapro '5mg'$  for GAD that was uncontrolled. She is also on cymbalta and trazodone for sleep and pain. She has not noted any serotonin syndrome side effects. She says that the lexapro '5mg'$  has been working well for her and she would like to keep things where they are.   She mentions she thinks her skin has yellowed in comparison to her sisters who she was on the phone with and is concerned about this.    Review of Systems  Constitutional:  Negative for activity change, fatigue and fever.  Respiratory:  Negative for cough and shortness of breath.   Cardiovascular:  Negative for chest pain.  Gastrointestinal:  Negative for abdominal pain.  Genitourinary:  Negative for difficulty urinating.       Current Meds  Medication Sig   albuterol (PROVENTIL HFA) 108 (90 Base) MCG/ACT inhaler Inhale 1-2 puffs into the lungs every 6 (six) hours as needed for wheezing or shortness of breath.   clonazePAM (KLONOPIN) 0.5 MG tablet Take 0.5-1 tablets (0.25-0.5 mg total) by mouth daily as needed for anxiety.   DULoxetine (CYMBALTA) 30 MG capsule Take 1 capsule (30 mg total) by mouth daily.   escitalopram (LEXAPRO) 5 MG tablet Take 1 tablet (5 mg  total) by mouth daily.   Fluticasone-Umeclidin-Vilant (TRELEGY ELLIPTA) 200-62.5-25 MCG/ACT AEPB Inhale 1 puff into the lungs daily at 2 PM.   LINZESS 145 MCG CAPS capsule TAKE 1 CAPSULE(145 MCG) BY MOUTH DAILY   omeprazole (PRILOSEC) 40 MG capsule Take 1 capsule (40 mg total) by mouth daily.   ondansetron (ZOFRAN ODT) 8 MG disintegrating tablet Take 1 tablet (8 mg total) by mouth every 8 (eight) hours as needed for nausea or vomiting.   traZODone (DESYREL) 100 MG tablet TAKE 1 TABLET(100 MG) BY MOUTH AT BEDTIME AS NEEDED FOR SLEEP   traZODone (DESYREL) 50 MG tablet TAKE 1/2 TO 1 TABLET(25 TO 50 MG) BY MOUTH AT BEDTIME AS NEEDED FOR SLEEP   triamcinolone (KENALOG) 0.025 % cream Apply 1 Application topically 3 (three) times daily as needed.    OBJECTIVE    Wt 140 lb (63.5 kg)   BMI 21.93 kg/m   Physical Exam Vitals reviewed.  Constitutional:      Appearance: She is well-developed.  HENT:     Head: Normocephalic and atraumatic.  Eyes:     Conjunctiva/sclera: Conjunctivae normal.  Cardiovascular:     Rate and Rhythm: Normal rate.  Pulmonary:     Effort: Pulmonary effort is normal.     Comments: Breathing without difficulty on exam Skin:    General: Skin is dry.     Coloration: Skin is not pale.  Neurological:     Mental Status: She is alert and oriented  to person, place, and time.  Psychiatric:        Behavior: Behavior normal.       ASSESSMENT & PLAN    Problem List Items Addressed This Visit       Other   Hyperlipidemia    - ordered Lipid panel  - last draw was 6 months ago and pt is unable to tolerate statins due to myalgias. Will continue to monitor levels believe she has genetic component since she admits to a low cholesterol diet.       Relevant Orders   Lipid panel   Generalized anxiety disorder - Primary    - pt says she is feeling better on the lexapro '5mg'$  and would like to stay at this dose - repeat GAD7: 10 and PHQ9:8 but patient says she has always been  wanting to stay away from people.  - since she notes personal improvement I will keep her on '5mg'$  lexapro      Recent skin changes    - on video visit, pt says that she was talking to her sister in [place] and she noticed her own skin was "yellow" compared to her sisters. Video was blurry so I have ordered labs to rule out causes of jaundice such as CMP, hepatitis panel.  - will schedule a follow up with patient once blood work results to discuss      Relevant Orders   COMPLETE METABOLIC PANEL WITH GFR   Hepatitis panel, acute    No follow-ups on file.      No orders of the defined types were placed in this encounter.   Orders Placed This Encounter  Procedures   Lipid panel    Order Specific Question:   Has the patient fasted?    Answer:   No    Order Specific Question:   Release to patient    Answer:   Immediate   COMPLETE METABOLIC PANEL WITH GFR   Hepatitis panel, acute    Order Specific Question:   Release to patient    Answer:   Immediate     Owens Loffler, [facility] [phone] (phone) [phone] (fax)  [place]

## 2022-01-24 DIAGNOSIS — F411 Generalized anxiety disorder: Secondary | ICD-10-CM | POA: Insufficient documentation

## 2022-01-29 ENCOUNTER — Ambulatory Visit: Payer: Medicare Other | Admitting: Pulmonary Disease

## 2022-01-30 ENCOUNTER — Ambulatory Visit: Payer: Medicare Other | Admitting: Medical-Surgical

## 2022-02-16 ENCOUNTER — Other Ambulatory Visit: Payer: Self-pay | Admitting: Family Medicine

## 2022-02-16 DIAGNOSIS — G4700 Insomnia, unspecified: Secondary | ICD-10-CM

## 2022-02-26 ENCOUNTER — Ambulatory Visit: Payer: Medicare Other | Admitting: Sports Medicine

## 2022-03-01 ENCOUNTER — Ambulatory Visit (HOSPITAL_BASED_OUTPATIENT_CLINIC_OR_DEPARTMENT_OTHER): Payer: Medicare Other | Admitting: Pulmonary Disease

## 2022-03-01 ENCOUNTER — Ambulatory Visit: Payer: Medicare Other | Admitting: Primary Care

## 2022-03-16 ENCOUNTER — Other Ambulatory Visit: Payer: Self-pay | Admitting: Family Medicine

## 2022-03-16 DIAGNOSIS — G4700 Insomnia, unspecified: Secondary | ICD-10-CM

## 2022-03-18 ENCOUNTER — Other Ambulatory Visit: Payer: Self-pay | Admitting: Family Medicine

## 2022-03-18 DIAGNOSIS — G4700 Insomnia, unspecified: Secondary | ICD-10-CM

## 2022-03-19 ENCOUNTER — Encounter: Payer: Self-pay | Admitting: Primary Care

## 2022-03-19 ENCOUNTER — Ambulatory Visit (INDEPENDENT_AMBULATORY_CARE_PROVIDER_SITE_OTHER): Payer: Medicare Other | Admitting: Primary Care

## 2022-03-19 VITALS — BP 126/64 | HR 71 | Temp 98.1°F | Ht 67.0 in | Wt 135.4 lb

## 2022-03-19 DIAGNOSIS — J431 Panlobular emphysema: Secondary | ICD-10-CM | POA: Diagnosis not present

## 2022-03-19 DIAGNOSIS — R911 Solitary pulmonary nodule: Secondary | ICD-10-CM

## 2022-03-19 NOTE — Assessment & Plan Note (Signed)
-   Stable; Patient has small 26m RML nodule. Following with lung cancer screening program. Due for repeat CT chest in June 2024.

## 2022-03-19 NOTE — Patient Instructions (Addendum)
Recommendations: - Continue to use [employer] - take two puffs every morning and evening - Use Albuterol 2 puffs every 4-6 hours for breakthrough shortness of breath and wheezing - Stay as active as possible - Continue to work on smoking cessation efforts, cut back and pick quit date if able - Due for repeat CT chest in June 2024 for lung cancer screening - Due for PCV 15 or PCV 20 pneumonia vaccine at pharmacy   Follow-up: - New patient visit 6 months with Dr. Shearon Stalls (COPD, former Loanne Drilling pt)

## 2022-03-19 NOTE — Assessment & Plan Note (Addendum)
-   Overall stable but has moderate symptom burden. Not acutely exacerbated. Still smoking and not ready to quit. She did not tolerate Trelegy 237mg. Continue to use Breztri Aerosphere two puffs every morning and evening and Albuterol 2 puffs every 4-6 hours for breakthrough shortness of breath and wheezing. Advised she stay as active as possible and continue to work on smoking cessation efforts. Due for PCV 15 or PCV 20 pneumonia vaccine, she left visit before being able to receive vaccine or after visit summary.

## 2022-03-19 NOTE — Progress Notes (Signed)
_0  ID: Erin [name], female    DOB: [birth year], 61 y.o.   MRN: [id number]  Chief Complaint  Patient presents with   Follow-up    Taking Breztri.  Cannot tolerate Trelegy d/t vomiting.  Pt states doing well.    Referring provider: Owens Loffler, DO  HPI: 61 year old female, current everyday smoker.  Past medical history significant for COPD.  Patient of Dr. Loanne Drilling, last seen in July 2023.  03/19/2022- Interim hx   Follow-up for COPD/emphysema  She is alright, at her baseline Breathing has been a little more labored last couple of months  She experiences shortness of breath at rest and with activities She did not tolerate Trelegy 236mg; currently on BSunGardShe uses albuterol 2-3 times a day with relief  She has an occasional cough with mucus production  She is still smoking, not ready to quit. She has cut back.  Following with lung cancer screening program. She has a small right middle lobe nodule measuring 3.325m needs repeat LDCT in June 2024 Patient is due for PCV 15 or PCV 20 vaccine  Allergies  Allergen Reactions   Bee Venom Anaphylaxis   Dicyclomine Shortness Of Breath    SHOB, swelling of tongue   Butalbital-Apap-Caffeine Other (See Comments)    Abd. pain   Singulair  [Montelukast Sodium] Other (See Comments)    "Sinus infection for months"   Ciprofloxacin Nausea And Vomiting   Tylenol [Acetaminophen] Other (See Comments)    Can not take due to patient's liver cancer and resection.   Amoxicillin Diarrhea    Severe diarrhea   Lyrica [Pregabalin] Itching    Immunization History  Administered Date(s) Administered   Influenza,inj,Quad PF,6+ Mos 02/03/2019, 01/01/2020, 12/20/2021   PFIZER(Purple Top)SARS-COV-2 Vaccination 07/16/2019, 08/10/2019, 01/16/2020   Pneumococcal Polysaccharide-23 02/03/2015   Tdap 05/08/2010, 07/31/2021   Zoster Recombinat (Shingrix) 07/31/2021, 12/20/2021   Zoster, Live 01/04/2014    Past Medical History:   Diagnosis Date   Anxiety    Arthritis    hands and feet   ASCUS (atypical squamous cells of undetermined significance) on Pap smear    Blood transfusion 1983   [place]   Bursitis    Cancer ([place]   liver   Chronic constipation    Condyloma    COPD (chronic obstructive pulmonary disease) ([place]   Depression    per pt h/o depression- no meds currently. Dx of bi-polar depression in [facility]   Headache(784.0)    migraines   Hot flashes, menopausal    HPV (human papilloma virus) infection    Lung nodule, multiple    Osteopenia    Seizures ([place]   unknown reason for seizures- last seizurein Sept 2012   TIA (transient ischemic attack)     Tobacco History: Social History   Tobacco Use  Smoking Status Every Day   Packs/day: 1.00   Years: 40.00   Total pack years: 40.00   Types: Cigarettes  Smokeless Tobacco Never  Tobacco Comments   0.5 pack a day   Ready to quit: Not Answered Counseling given: Not Answered Tobacco comments: 0.5 pack a day   Outpatient Medications Prior to Visit  Medication Sig Dispense Refill   albuterol (PROVENTIL HFA) 108 (90 Base) MCG/ACT inhaler Inhale 1-2 puffs into the lungs every 6 (six) hours as needed for wheezing or shortness of breath. 18 g 11   Budeson-Glycopyrrol-Formoterol (BREZTRI AEROSPHERE) 160-9-4.8 MCG/ACT AERO Inhale 2 puffs into the lungs  2 (two) times daily.     clonazePAM (KLONOPIN) 0.5 MG tablet Take 0.5-1 tablets (0.25-0.5 mg total) by mouth daily as needed for anxiety. 20 tablet 1   escitalopram (LEXAPRO) 5 MG tablet TAKE 1 TABLET(5 MG) BY MOUTH DAILY 30 tablet 3   LINZESS 145 MCG CAPS capsule TAKE 1 CAPSULE(145 MCG) BY MOUTH DAILY 30 capsule 0   omeprazole (PRILOSEC) 40 MG capsule Take 1 capsule (40 mg total) by mouth daily. 90 capsule 3   ondansetron (ZOFRAN ODT) 8 MG disintegrating tablet Take 1 tablet (8 mg total) by mouth every 8 (eight) hours as needed for nausea or vomiting. 24 tablet  0   traZODone (DESYREL) 100 MG tablet TAKE 1 TABLET(100 MG) BY MOUTH AT BEDTIME AS NEEDED FOR SLEEP 30 tablet 0   traZODone (DESYREL) 50 MG tablet TAKE 1/2 TO 1 TABLET(25 TO 50 MG) BY MOUTH AT BEDTIME AS NEEDED FOR SLEEP 30 tablet 0   triamcinolone (KENALOG) 0.025 % cream Apply 1 Application topically 3 (three) times daily as needed. 160 g 0   Fluticasone-Umeclidin-Vilant (TRELEGY ELLIPTA) 200-62.5-25 MCG/ACT AEPB Inhale 1 puff into the lungs daily at 2 PM. (Patient not taking: Reported on 03/19/2022) 60 each 5   No facility-administered medications prior to visit.   Review of Systems  Review of Systems  Constitutional: Negative.   HENT: Negative.  Negative for congestion.   Respiratory:  Positive for cough and shortness of breath. Negative for chest tightness and wheezing.   Cardiovascular: Negative.  Negative for leg swelling.   Physical Exam  BP 126/64 (BP Location: Right Arm, Patient Position: Sitting, Cuff Size: Normal)   Pulse 71   Temp 98.1 F (36.7 C) (Oral)   Ht _0  (1.702 m)   Wt 135 lb 6.4 oz (61.4 kg)   SpO2 96%   BMI 21.21 kg/m  Physical Exam Constitutional:      Appearance: Normal appearance.  HENT:     Head: Normocephalic and atraumatic.     Mouth/Throat:     Mouth: Mucous membranes are moist.     Pharynx: Oropharynx is clear.  Cardiovascular:     Rate and Rhythm: Normal rate and regular rhythm.  Pulmonary:     Effort: Pulmonary effort is normal. No respiratory distress.     Breath sounds: Normal breath sounds. No stridor. No wheezing, rhonchi or rales.     Comments: CTA; O2 96% RA. No overt dyspnea noted Musculoskeletal:        General: No swelling. Normal range of motion.  Neurological:     General: No focal deficit present.     Mental Status: She is alert and oriented to person, place, and time. Mental status is at baseline.  Psychiatric:        Mood and Affect: Mood normal.        Behavior: Behavior normal.        Thought Content: Thought content  normal.        Judgment: Judgment normal.      Lab Results:  CBC    Component Value Date/Time   WBC 5.3 07/31/2021 0000   RBC 4.15 07/31/2021 0000   HGB 13.4 07/31/2021 0000   HGB 13.7 04/29/2020 0752   HCT 39.9 07/31/2021 0000   PLT 285 07/31/2021 0000   PLT 276 04/29/2020 0752   MCV 96.1 07/31/2021 0000   MCH 32.3 07/31/2021 0000   MCHC 33.6 07/31/2021 0000   RDW 13.0 07/31/2021 0000   LYMPHSABS 1,776 07/31/2021 0000  MONOABS 0.4 04/29/2020 0752   EOSABS 48 07/31/2021 0000   BASOSABS 32 07/31/2021 0000    BMET    Component Value Date/Time   NA 142 07/31/2021 0000   K 4.1 07/31/2021 0000   CL 105 07/31/2021 0000   CO2 28 07/31/2021 0000   GLUCOSE 94 07/31/2021 0000   BUN 19 07/31/2021 0000   CREATININE 0.94 07/31/2021 0000   CALCIUM 9.5 07/31/2021 0000   GFRNONAA >60 04/29/2020 0752   GFRNONAA 74 08/18/2019 0817   GFRAA 86 08/18/2019 0817    BNP No results found for: "BNP"  ProBNP    Component Value Date/Time   PROBNP 15.0 12/17/2007 0004    Imaging: No results found.   Assessment & Plan:   COPD (chronic obstructive pulmonary disease) (HCC) - Overall stable but has moderate symptom burden. Not acutely exacerbated. Still smoking and not ready to quit. She did not tolerate Trelegy 223mg. Continue to use Breztri Aerosphere two puffs every morning and evening and Albuterol 2 puffs every 4-6 hours for breakthrough shortness of breath and wheezing. Advised she stay as active as possible and continue to work on smoking cessation efforts. Due for PCV 15 or PCV 20 pneumonia vaccine, she left visit before being able to receive.   Pulmonary nodule - Stable; Patient has small 396mRML nodule. Following with lung cancer screening program. Due for repeat CT chest in June 2024.    ElMartyn EhrichNP 03/19/2022

## 2022-03-20 ENCOUNTER — Other Ambulatory Visit: Payer: Self-pay | Admitting: Family Medicine

## 2022-03-20 DIAGNOSIS — G4700 Insomnia, unspecified: Secondary | ICD-10-CM

## 2022-04-13 ENCOUNTER — Other Ambulatory Visit: Payer: Self-pay | Admitting: Family Medicine

## 2022-04-13 DIAGNOSIS — G4700 Insomnia, unspecified: Secondary | ICD-10-CM

## 2022-04-23 ENCOUNTER — Ambulatory Visit (INDEPENDENT_AMBULATORY_CARE_PROVIDER_SITE_OTHER): Payer: Medicare Other | Admitting: Sports Medicine

## 2022-04-23 ENCOUNTER — Ambulatory Visit (INDEPENDENT_AMBULATORY_CARE_PROVIDER_SITE_OTHER): Payer: Medicare Other

## 2022-04-23 DIAGNOSIS — M47816 Spondylosis without myelopathy or radiculopathy, lumbar region: Secondary | ICD-10-CM

## 2022-04-23 MED ORDER — TRIAMCINOLONE ACETONIDE 40 MG/ML IJ SUSP
80.0000 mg | Freq: Once | INTRAMUSCULAR | Status: AC
  Administered 2022-04-23: 80 mg via INTRAMUSCULAR

## 2022-04-23 NOTE — Progress Notes (Signed)
    Procedures performed today:    Procedure: Real-time Ultrasound Guided injection of the right greater trochanteric bursa Device: Samsung HS60  Verbal informed consent obtained.  Time-out conducted.  Noted no overlying erythema, induration, or other signs of local infection.  Skin prepped in a sterile fashion.  Local anesthesia: Topical Ethyl chloride.  With sterile technique and under real time ultrasound guidance: Noted synovitis deep to the hip abductor tendons, 1 cc Kenalog 40, 2 cc lidocaine, 2 cc bupivacaine injected easily Completed without difficulty  Advised to call if fevers/chills, erythema, induration, drainage, or persistent bleeding.  Images permanently stored and available for review in PACS.  Impression: Technically successful ultrasound guided injection.  Procedure: Real-time Ultrasound Guided injection of the left greater trochanteric bursa Device: Samsung HS60  Verbal informed consent obtained.  Time-out conducted.  Noted no overlying erythema, induration, or other signs of local infection.  Skin prepped in a sterile fashion.  Local anesthesia: Topical Ethyl chloride.  With sterile technique and under real time ultrasound guidance: Noted synovitis deep to the hip abductor tendons, 1 cc Kenalog 40, 2 cc lidocaine, 2 cc bupivacaine injected easily Completed without difficulty  Advised to call if fevers/chills, erythema, induration, drainage, or persistent bleeding.  Images permanently stored and available for review in PACS.  Impression: Technically successful ultrasound guided injection.  Independent interpretation of notes and tests performed by another provider:   None.  Brief History, Exam, Impression, and Recommendations:    Lumbar spondylosis Injury is a pleasant 62 year old female, she has multifactorial hip and back pain, known lumbar DDD, she also has bilateral trochanteric bursitis, has not had a greater trochanteric bursa injection in almost a year,  today we did both trochanteric bursae as she has tenderness to palpation over the greater trochanters bilaterally. She also has a flare of her back pain, she did really well after L4-L5 interlaminar epidural so we will order this as well. Return to see me as needed.    ____________________________________________ Gwen Her. Dianah Field, M.D., ABFM., CAQSM., AME. Primary Care and Sports Medicine [place]  Adjunct Professor of [employer]  Risk manager

## 2022-04-23 NOTE — Assessment & Plan Note (Signed)
Injury is a pleasant 62 year old female, she has multifactorial hip and back pain, known lumbar DDD, she also has bilateral trochanteric bursitis, has not had a greater trochanteric bursa injection in almost a year, today we did both trochanteric bursae as she has tenderness to palpation over the greater trochanters bilaterally. She also has a flare of her back pain, she did really well after L4-L5 interlaminar epidural so we will order this as well. Return to see me as needed.

## 2022-05-01 ENCOUNTER — Other Ambulatory Visit: Payer: Self-pay | Admitting: Family Medicine

## 2022-05-01 DIAGNOSIS — G4700 Insomnia, unspecified: Secondary | ICD-10-CM

## 2022-05-03 ENCOUNTER — Other Ambulatory Visit: Payer: Medicare Other

## 2022-05-07 ENCOUNTER — Other Ambulatory Visit: Payer: Self-pay

## 2022-05-07 ENCOUNTER — Telehealth: Payer: Self-pay

## 2022-05-07 DIAGNOSIS — G4700 Insomnia, unspecified: Secondary | ICD-10-CM

## 2022-05-07 MED ORDER — TRAZODONE HCL 150 MG PO TABS: 150.0000 mg | ORAL_TABLET | Freq: Every day | ORAL | 3 refills | Status: DC

## 2022-05-07 NOTE — Telephone Encounter (Signed)
Sent!

## 2022-05-09 ENCOUNTER — Other Ambulatory Visit: Payer: Self-pay | Admitting: Pulmonary Disease

## 2022-05-09 ENCOUNTER — Ambulatory Visit
Admission: RE | Admit: 2022-05-09 | Discharge: 2022-05-09 | Disposition: A | Discharge status: Home / Self Care | Payer: 59 | Source: Ambulatory Visit | Attending: Sports Medicine | Admitting: Sports Medicine

## 2022-05-09 DIAGNOSIS — M47816 Spondylosis without myelopathy or radiculopathy, lumbar region: Secondary | ICD-10-CM

## 2022-05-09 MED ORDER — METHYLPREDNISOLONE ACETATE 40 MG/ML INJ SUSP (RADIOLOG
80.0000 mg | Freq: Once | INTRAMUSCULAR | Status: AC
  Administered 2022-05-09: 80 mg via EPIDURAL

## 2022-05-09 MED ORDER — IOPAMIDOL (ISOVUE-M 200) INJECTION 41%
1.0000 mL | Freq: Once | INTRAMUSCULAR | Status: AC
  Administered 2022-05-09: 1 mL via EPIDURAL

## 2022-05-09 NOTE — Discharge Instructions (Signed)

## 2022-05-17 ENCOUNTER — Other Ambulatory Visit: Payer: Self-pay | Admitting: Family Medicine

## 2022-05-17 DIAGNOSIS — G4700 Insomnia, unspecified: Secondary | ICD-10-CM

## 2022-05-21 ENCOUNTER — Encounter (INDEPENDENT_AMBULATORY_CARE_PROVIDER_SITE_OTHER): Payer: Self-pay

## 2022-05-21 ENCOUNTER — Encounter: Payer: Self-pay | Admitting: Sports Medicine

## 2022-05-25 NOTE — Progress Notes (Unsigned)
Last Mammogram: 01/15/22- negative Last Pap Smear:  11/21/11 Last Colon Screening;  scheduled for Feb 2024 Seat Belts:   yes Sun Screen:   no Dental Check Up:  yes Brush & Floss:  yes  Urinary incont-Yes Per pt vaginal warts

## 2022-05-28 ENCOUNTER — Encounter: Payer: Self-pay | Admitting: Obstetrics & Gynecology

## 2022-05-28 ENCOUNTER — Ambulatory Visit (INDEPENDENT_AMBULATORY_CARE_PROVIDER_SITE_OTHER): Payer: 59 | Admitting: Obstetrics & Gynecology

## 2022-05-28 VITALS — BP 124/81 | HR 86 | Ht 67.0 in | Wt 129.0 lb

## 2022-05-28 DIAGNOSIS — N6325 Unspecified lump in the left breast, overlapping quadrants: Secondary | ICD-10-CM

## 2022-05-28 DIAGNOSIS — R3915 Urgency of urination: Secondary | ICD-10-CM

## 2022-05-28 DIAGNOSIS — Z01419 Encounter for gynecological examination (general) (routine) without abnormal findings: Secondary | ICD-10-CM

## 2022-05-28 DIAGNOSIS — R102 Pelvic and perineal pain: Secondary | ICD-10-CM | POA: Diagnosis not present

## 2022-05-28 DIAGNOSIS — R634 Abnormal weight loss: Secondary | ICD-10-CM

## 2022-05-28 MED ORDER — CLOTRIMAZOLE-BETAMETHASONE 1-0.05 % EX CREA: 1.0000 | TOPICAL_CREAM | Freq: Two times a day (BID) | CUTANEOUS | 0 refills | Status: DC

## 2022-05-28 NOTE — Progress Notes (Signed)
Subjective:     Erin [name] is a 62 y.o. female here for a routine exam.  Current complaints: stress and urge incontinence, right sided pelvic pain where ovary used to be, unintentional weight loss, early satiety, brown spots (Warts) on perineum.    Gynecologic History No LMP recorded. Patient has had a hysterectomy. Contraception: post menopausal status Last Mammogram: 01/15/22- negative Last Pap Smear:  11/21/11 Last Colon Screening;  scheduled for Feb 2024 Seat Belts:   yes Sun Screen:   no Dental Check Up:  yes Brush & Floss:  yes  Urinary incont-Yes Per pt vaginal warts    Obstetric History OB History  Gravida Para Term Preterm AB Living  4 2 2   2 2  $ SAB IAB Ectopic Multiple Live Births               # Outcome Date GA Lbr Len/2nd Weight Sex Delivery Anes PTL Lv  4 AB           3 AB           2 Term           1 Term              The following portions of the patient's history were reviewed and updated as appropriate: allergies, current medications, past family history, past medical history, past social history, past surgical history, and problem list.  Review of Systems Pertinent items noted in HPI and remainder of comprehensive ROS otherwise negative.    Objective:     Vitals:   05/28/22 0943  BP: 124/81  Pulse: 86  Weight: 129 lb (58.5 kg)  Height: 5' 7"$  (1.702 m)   Vitals:  WNL General appearance: alert, cooperative and no distress  HEENT: Normocephalic, without obvious abnormality, atraumatic Eyes: negative Throat: lips, mucosa, and tongue normal; teeth and gums normal  Respiratory: Clear to auscultation bilaterally  CV: Regular rate and rhythm  Breasts:  Normal appearance, left sided nodule below nipple central--pt states it has been there a long time. , no nipple retraction or dimpling  GI: Soft, non-tender; bowel sounds normal; no masses,  no organomegaly  GU: External Genitalia:  [name] V, brown macules and 2 raided.  ?SK vs warts.   Urethra:   No prolapse   Vagina: Pink, normal rugae, no blood or discharge  Cervix: Surgically absent  Uterus:  Surgically absent  Adnexa: Surgically absent  Musculoskeletal: No edema, redness or tenderness in the calves or thighs  Skin: No lesions or rash  Lymphatic: Axillary adenopathy: none     Psychiatric: Normal mood and behavior        Assessment:    Healthy female exam.  Mixed urinary incontinence Vulvar fissure and macules Right pelvic pain and early satiety Left breast mass   Plan:    1.  Referral to urogyn for mixed incontinence.  UA, Ucx and cytology sent.  PVR is 4 cc.  Still feels urge to void after emptying bladder. Refer to urogyn. 2.  Early satiety, unexpected weight loss, diarrhea (?from magnesium).  Given BRCA 2 positive (s/p [id number]) will get CT of abdomen pelvis 3.  Vulvar macules--will return for freezing with liquid nitrogen.  This will help with SK and warts.  Consider biopsy. 4.  Vulvar fissure right between labia minora and majora.  Will try clortrimazole.  If not better, will biopsy at next visit.  5.  Diagnostic mammogram of left breast--[facility] films at [facility].

## 2022-05-28 NOTE — Progress Notes (Deleted)
error 

## 2022-05-29 LAB — COMPREHENSIVE METABOLIC PANEL
ALT: 12 IU/L (ref 0–32)
AST: 19 IU/L (ref 0–40)
Albumin/Globulin Ratio: 2 (ref 1.2–2.2)
Albumin: 4.5 g/dL (ref 3.9–4.9)
Alkaline Phosphatase: 98 IU/L (ref 44–121)
BUN/Creatinine Ratio: 11 — ABNORMAL LOW (ref 12–28)
BUN: 9 mg/dL (ref 8–27)
Bilirubin Total: 0.3 mg/dL (ref 0.0–1.2)
CO2: 22 mmol/L (ref 20–29)
Calcium: 9.8 mg/dL (ref 8.7–10.3)
Chloride: 107 mmol/L — ABNORMAL HIGH (ref 96–106)
Creatinine, Ser: 0.81 mg/dL (ref 0.57–1.00)
Globulin, Total: 2.3 g/dL (ref 1.5–4.5)
Glucose: 98 mg/dL (ref 70–99)
Potassium: 4.4 mmol/L (ref 3.5–5.2)
Sodium: 147 mmol/L — ABNORMAL HIGH (ref 134–144)
Total Protein: 6.8 g/dL (ref 6.0–8.5)
eGFR: 82 mL/min/{1.73_m2} (ref >59)

## 2022-05-29 LAB — URINALYSIS, ROUTINE W REFLEX MICROSCOPIC
Bilirubin, UA: NEGATIVE
Glucose, UA: NEGATIVE
Ketones, UA: NEGATIVE
Nitrite, UA: NEGATIVE
RBC, UA: NEGATIVE
Specific Gravity, UA: 1.024 (ref 1.005–1.030)
Urobilinogen, Ur: 1 mg/dL (ref 0.2–1.0)
pH, UA: 6 (ref 5.0–7.5)

## 2022-05-29 LAB — MICROSCOPIC EXAMINATION
Bacteria, UA: NONE SEEN
Casts: NONE SEEN /lpf
RBC, Urine: NONE SEEN /hpf (ref 0–2)
WBC, UA: NONE SEEN /hpf (ref 0–5)

## 2022-05-29 LAB — SPECIMEN STATUS REPORT

## 2022-05-31 LAB — URINE CULTURE: Organism ID, Bacteria: NO GROWTH

## 2022-05-31 LAB — SPECIMEN STATUS REPORT

## 2022-06-11 ENCOUNTER — Telehealth: Payer: Self-pay | Admitting: Family Medicine

## 2022-06-11 NOTE — Telephone Encounter (Signed)
Contacted Selenia Noone to schedule their annual wellness visit. Appointment made for 06/25/22 at [place] [facility] Direct Dial: [phone]

## 2022-06-12 ENCOUNTER — Encounter: Payer: Self-pay | Admitting: Obstetrics & Gynecology

## 2022-06-25 ENCOUNTER — Ambulatory Visit (INDEPENDENT_AMBULATORY_CARE_PROVIDER_SITE_OTHER): Payer: 59 | Admitting: Family Medicine

## 2022-06-25 DIAGNOSIS — Z0000 Encounter for general adult medical examination without abnormal findings: Secondary | ICD-10-CM | POA: Diagnosis not present

## 2022-06-25 NOTE — Patient Instructions (Signed)
[place] Maintenance Summary and Written Plan of Care  Ms. Erin [name] ,  Thank you for allowing me to perform your Medicare Annual Wellness Visit and for your ongoing commitment to your health.   Health Maintenance & Immunization History Health Maintenance  Topic Date Due   COVID-19 Vaccine (4 - 2023-24 season) 07/11/2022 (Originally 12/15/2021)   Hepatitis C Screening  06/25/2023 (Originally 05/25/1978)   HIV Screening  06/25/2023 (Originally 05/26/1975)   Lung Cancer Screening  09/21/2022   Medicare Annual Wellness (AWV)  06/25/2023   MAMMOGRAM  06/13/2024   COLONOSCOPY (Pts 45-98yr Insurance coverage will need to be confirmed)  10/26/2028   DTaP/Tdap/Td (3 - Td or Tdap) 08/01/2031   INFLUENZA VACCINE  Completed   Zoster Vaccines- Shingrix  Completed   HPV VACCINES  Aged Out   Immunization History  Administered Date(s) Administered   Influenza,inj,Quad PF,6+ Mos 02/03/2019, 01/01/2020, 12/20/2021   PFIZER(Purple Top)SARS-COV-2 Vaccination 07/16/2019, 08/10/2019, 01/16/2020   Pneumococcal Polysaccharide-23 02/03/2015   Tdap 05/08/2010, 07/31/2021   Zoster Recombinat (Shingrix) 07/31/2021, 12/20/2021   Zoster, Live 01/04/2014    These are the patient goals that we discussed:  Goals Addressed               This Visit's Progress     Patient Stated (pt-stated)        Patient states she would like to be more active.         This is a list of Health Maintenance Items that are overdue or due now: Hep C screening and HIV screening    Orders/Referrals Placed Today: No orders of the defined types were placed in this encounter.  (Contact our referral department at [phone] you have not spoken with someone about your referral appointment within the next 5 days)    Follow-up Plan Follow-up with WOwens Loffler DO as planned Medicare wellness visit in one year. Patient will access AVS on my chart.     Health Maintenance,  Female Adopting a healthy lifestyle and getting preventive care are important in promoting health and wellness. Ask your health care provider about: The right schedule for you to have regular tests and exams. Things you can do on your own to prevent diseases and keep yourself healthy. What should I know about diet, weight, and exercise? Eat a healthy diet  Eat a diet that includes plenty of vegetables, fruits, low-fat dairy products, and lean protein. Do not eat a lot of foods that are high in solid fats, added sugars, or sodium. Maintain a healthy weight Body mass index (BMI) is used to identify weight problems. It estimates body fat based on height and weight. Your health care provider can help determine your BMI and help you achieve or maintain a healthy weight. Get regular exercise Get regular exercise. This is one of the most important things you can do for your health. Most adults should: Exercise for at least 150 minutes each week. The exercise should increase your heart rate and make you sweat (moderate-intensity exercise). Do strengthening exercises at least twice a week. This is in addition to the moderate-intensity exercise. Spend less time sitting. Even light physical activity can be beneficial. Watch cholesterol and blood lipids Have your blood tested for lipids and cholesterol at 62years of age, then have this test every 5 years. Have your cholesterol levels checked more often if: Your lipid or cholesterol levels are high. You are older than 62years of age. You are at high risk  for heart disease. What should I know about cancer screening? Depending on your health history and family history, you may need to have cancer screening at various ages. This may include screening for: Breast cancer. Cervical cancer. Colorectal cancer. Skin cancer. Lung cancer. What should I know about heart disease, diabetes, and high blood pressure? Blood pressure and heart disease High blood  pressure causes heart disease and increases the risk of stroke. This is more likely to develop in people who have high blood pressure readings or are overweight. Have your blood pressure checked: Every 3-5 years if you are 46-70 years of age. Every year if you are 5 years old or older. Diabetes Have regular diabetes screenings. This checks your fasting blood sugar level. Have the screening done: Once every three years after age 22 if you are at a normal weight and have a low risk for diabetes. More often and at a younger age if you are overweight or have a high risk for diabetes. What should I know about preventing infection? Hepatitis B If you have a higher risk for hepatitis B, you should be screened for this virus. Talk with your health care provider to find out if you are at risk for hepatitis B infection. Hepatitis C Testing is recommended for: Everyone born from [birth year] through 1965. Anyone with known risk factors for hepatitis C. Sexually transmitted infections (STIs) Get screened for STIs, including gonorrhea and chlamydia, if: You are sexually active and are younger than 62 years of age. You are older than 62 years of age and your health care provider tells you that you are at risk for this type of infection. Your sexual activity has changed since you were last screened, and you are at increased risk for chlamydia or gonorrhea. Ask your health care provider if you are at risk. Ask your health care provider about whether you are at high risk for HIV. Your health care provider may recommend a prescription medicine to help prevent HIV infection. If you choose to take medicine to prevent HIV, you should first get tested for HIV. You should then be tested every 3 months for as long as you are taking the medicine. Pregnancy If you are about to stop having your period (premenopausal) and you may become pregnant, seek counseling before you get pregnant. Take 400 to 800 micrograms (mcg) of folic  acid every day if you become pregnant. Ask for birth control (contraception) if you want to prevent pregnancy. Osteoporosis and menopause Osteoporosis is a disease in which the bones lose minerals and strength with aging. This can result in bone fractures. If you are 55 years old or older, or if you are at risk for osteoporosis and fractures, ask your health care provider if you should: Be screened for bone loss. Take a calcium or vitamin D supplement to lower your risk of fractures. Be given hormone replacement therapy (HRT) to treat symptoms of menopause. Follow these instructions at home: Alcohol use Do not drink alcohol if: Your health care provider tells you not to drink. You are pregnant, may be pregnant, or are planning to become pregnant. If you drink alcohol: Limit how much you have to: 0-1 drink a day. Know how much alcohol is in your drink. In the [place] one drink equals one 12 oz bottle of beer (355 mL), one 5 oz glass of wine (148 mL), or one 1 oz glass of hard liquor (44 mL). Lifestyle Do not use any products that contain nicotine or tobacco. These  products include cigarettes, chewing tobacco, and vaping devices, such as e-cigarettes. If you need help quitting, ask your health care provider. Do not use street drugs. Do not share needles. Ask your health care provider for help if you need support or information about quitting drugs. General instructions Schedule regular health, dental, and eye exams. Stay current with your vaccines. Tell your health care provider if: You often feel depressed. You have ever been abused or do not feel safe at home. Summary Adopting a healthy lifestyle and getting preventive care are important in promoting health and wellness. Follow your health care provider's instructions about healthy diet, exercising, and getting tested or screened for diseases. Follow your health care provider's instructions on monitoring your cholesterol and blood  pressure. This information is not intended to replace advice given to you by your health care provider. Make sure you discuss any questions you have with your health care provider. Document Revised: 08/22/2020 Document Reviewed: 08/22/2020 Elsevier Patient Education  [employer].

## 2022-06-25 NOTE — Progress Notes (Signed)
MEDICARE ANNUAL WELLNESS VISIT  06/25/2022  Telephone Visit Disclaimer This Medicare AWV was conducted by telephone due to national recommendations for restrictions regarding the COVID-19 Pandemic (e.g. social distancing).  I verified, using two identifiers, that I am speaking with Erin [name] or their authorized healthcare agent. I discussed the limitations, risks, security, and privacy concerns of performing an evaluation and management service by telephone and the potential availability of an in-person appointment in the future. The patient expressed understanding and agreed to proceed.  Location of Patient: Home Location of Provider (nurse):  In the office.  Subjective:    Erin [name] is a 62 y.o. female patient of Mel Almond, Erika S, DO who had a Medicare Annual Wellness Visit today via telephone. Erin [name] is Disabled and lives alone. she has 2 children. she reports that she is socially active and does interact with friends/family regularly. she is minimally physically active and enjoys watching television.  Patient Care Team: Owens Loffler, DO as PCP - General (Family Medicine) Silverio Decamp, MD as Consulting Physician (Sports Medicine)     06/25/2022    8:53 AM 09/21/2021   11:31 AM 11/28/2020   10:51 AM 04/29/2020    9:41 AM 03/16/2019    8:55 AM 01/04/2011   11:34 AM 01/02/2011    3:09 PM  Advanced Directives  Does Patient Have a Medical Advance Directive? Yes Yes No No No  Patient does not have advance directive  Type of Advance Directive Living will;Healthcare Power of Attorney Out of facility DNR (pink MOST or yellow form)       Does patient want to make changes to medical advance directive? No - Patient declined Yes (Inpatient - patient defers changing a medical advance directive at this time - Information given)       Copy of [place] in Chart? Yes - validated most recent copy scanned in chart (See row information)        Would patient like  information on creating a medical advance directive?   No - Patient declined No - Patient declined     Pre-existing out of facility DNR order (yellow form or pink MOST form)  Yellow form placed in chart (order not valid for inpatient use)    No     Hospital Utilization Over the Past 12 Months: # of hospitalizations or ER visits: 0 # of surgeries: 0  Review of Systems    Patient reports that her overall health is unchanged compared to last year.  History obtained from chart review and the patient  Patient Reported Readings (BP, Pulse, CBG, Weight, etc) none  Pain Assessment Pain : 0-10 Pain Score: 5  Pain Type: Chronic pain Pain Location: Generalized Pain Descriptors / Indicators: Constant Pain Onset: More than a month ago Pain Frequency: Constant     Current Medications & Allergies (verified) Allergies as of 06/25/2022       Reactions   Bee Venom Anaphylaxis   Dicyclomine Shortness Of Breath   SHOB, swelling of tongue   Butalbital-apap-caffeine Other (See Comments)   Abd. pain   Singulair  [montelukast Sodium] Other (See Comments)   "Sinus infection for months"   Ciprofloxacin Nausea And Vomiting   Tylenol [acetaminophen] Other (See Comments)   Can not take due to patient's liver cancer and resection.   Amoxicillin Diarrhea   Severe diarrhea   Lyrica [pregabalin] Itching        Medication List        Accurate as  of June 25, 2022  9:10 AM. If you have any questions, ask your nurse or doctor.          albuterol 108 (90 Base) MCG/ACT inhaler Commonly known as: VENTOLIN HFA USE 1 TO 2 INHALATIONS BY MOUTH  EVERY 6 HOURS AS NEEDED FOR  WHEEZING OR SHORTNESS OF BREATH   Breztri Aerosphere 160-9-4.8 MCG/ACT Aero Generic drug: Budeson-Glycopyrrol-Formoterol USE 2 INHALATIONS BY MOUTH INTO  THE LUNGS IN THE MORNING AND AT  BEDTIME   clonazePAM 0.5 MG tablet Commonly known as: KLONOPIN Take 0.5-1 tablets (0.25-0.5 mg total) by mouth daily as needed for  anxiety.   clotrimazole-betamethasone cream Commonly known as: Lotrisone Apply 1 Application topically 2 (two) times daily.   escitalopram 5 MG tablet Commonly known as: LEXAPRO TAKE 1 TABLET(5 MG) BY MOUTH DAILY   Linzess 145 MCG Caps capsule Generic drug: linaclotide TAKE 1 CAPSULE(145 MCG) BY MOUTH DAILY   Magnesium 400 MG Tabs Take by mouth 2 (two) times daily.   omeprazole 40 MG capsule Commonly known as: PRILOSEC Take 1 capsule (40 mg total) by mouth daily.   ondansetron 8 MG disintegrating tablet Commonly known as: Zofran ODT Take 1 tablet (8 mg total) by mouth every 8 (eight) hours as needed for nausea or vomiting.   traZODone 150 MG tablet Commonly known as: DESYREL Take 1 tablet (150 mg total) by mouth at bedtime.   Trelegy Ellipta 200-62.5-25 MCG/ACT Aepb Generic drug: Fluticasone-Umeclidin-Vilant Inhale 1 puff into the lungs daily at 2 PM.   triamcinolone 0.025 % cream Commonly known as: KENALOG Apply 1 Application topically 3 (three) times daily as needed.        History (reviewed): Past Medical History:  Diagnosis Date   Allergy    Anxiety    Arthritis    hands and feet   ASCUS (atypical squamous cells of undetermined significance) on Pap smear    Blood transfusion 1983   In [place]   BRCA2 gene mutation positive 2014   Bursitis    Cancer ([place])    liver   Chronic constipation    Condyloma    COPD (chronic obstructive pulmonary disease) (HCC)    Depression    per pt h/o depression- no meds currently. Dx of bi-polar depression in EPIC   Emphysema of lung ([place])    GERD (gastroesophageal reflux disease)    Headache(784.0)    migraines   Hot flashes, menopausal    HPV (human papilloma virus) infection    Lung nodule, multiple    Osteopenia    Seizures ([place])    unknown reason for seizures- last seizurein Sept 2012   Stroke [facility])    TIA (transient ischemic attack)    Past Surgical History:  Procedure Laterality Date   ABDOMINAL  HYSTERECTOMY     ABDOMINOPLASTY     BREAST SURGERY     CHOLECYSTECTOMY     HERNIA REPAIR     LAPAROSCOPY  01/04/2011   Procedure: LAPAROSCOPY OPERATIVE;  Surgeon: Juliene Pina C. Hulan Fray, MD;  Location: [facility];  Service: Gynecology;  Laterality: N/A;   LAPAROSCOPY  09/14/2012   gall bladder and 1/2 liver   SALPINGOOPHORECTOMY  01/04/2011   Procedure: SALPINGO OOPHERECTOMY;  Surgeon: Myra C. Hulan Fray, MD;  Location: [facility];  Service: Gynecology;  Laterality: Bilateral;   TOTAL VAGINAL HYSTERECTOMY  62 yrs old   Family History  Problem Relation Age of Onset   Breast cancer Mother        postmenopausal   Depression Mother  Drug abuse Mother    Drug abuse Father    Lung cancer Father    Alcohol abuse Daughter    Breast cancer Daughter    Drug abuse Sister    Breast cancer Maternal Aunt    Ovarian cancer Neg Hx    Uterine cancer Neg Hx    Social History   Socioeconomic History   Marital status: Divorced    Spouse name: Not on file   Number of children: 2   Years of education: 12th grade   Highest education level: 12th grade  Occupational History   Occupation: Disabled.  Tobacco Use   Smoking status: Every Day    Packs/day: 1.00    Years: 40.00    Total pack years: 40.00    Types: Cigarettes   Smokeless tobacco: Never   Tobacco comments:    0.5 pack a day  Vaping Use   Vaping Use: Never used  Substance and Sexual Activity   Alcohol use: No   Drug use: Not Currently    Types: Cocaine, Marijuana    Comment: history of use   Sexual activity: Not Currently    Partners: Male  Other Topics Concern   Not on file  Social History Narrative   Lives alone with her cat. She has two children. She enjoys watching t.v.   Social Determinants of Health   Financial Resource Strain: Low Risk  (06/24/2022)   Overall Financial Resource Strain (CARDIA)    Difficulty of Paying Living Expenses: Not hard at all  Food Insecurity: No Food Insecurity (06/24/2022)   Hunger Vital Sign    Worried  About Running Out of Food in the Last Year: Never true    Ran Out of Food in the Last Year: Never true  Transportation Needs: No Transportation Needs (06/24/2022)   PRAPARE - Hydrologist (Medical): No    Lack of Transportation (Non-Medical): No  Physical Activity: Inactive (06/24/2022)   Exercise Vital Sign    Days of Exercise per Week: 0 days    Minutes of Exercise per Session: 0 min  Stress: Stress Concern Present (06/24/2022)   [place]    Feeling of Stress : Rather much  Social Connections: Socially Isolated (06/25/2022)   Social Connection and Isolation Panel [NHANES]    Frequency of Communication with Friends and Family: More than three times a week    Frequency of Social Gatherings with Friends and Family: Once a week    Attends Religious Services: Never    Marine scientist or Organizations: No    Attends Archivist Meetings: Never    Marital Status: Divorced    Activities of Daily Living    06/24/2022    9:40 AM  In your present state of health, do you have any difficulty performing the following activities:  Hearing? 0  Vision? 0  Difficulty concentrating or making decisions? 0  Walking or climbing stairs? 1  Dressing or bathing? 0  Doing errands, shopping? 0  Preparing Food and eating ? N  Using the Toilet? N  In the past six months, have you accidently leaked urine? Y  Do you have problems with loss of bowel control? N  Managing your Medications? N  Managing your Finances? N  Housekeeping or managing your Housekeeping? N    Patient Education/ Literacy How often do you need to have someone help you when you read instructions, pamphlets, or other written materials  from your doctor or pharmacy?: 1 - Never What is the last grade level you completed in school?: 12th grade  Exercise Current Exercise Habits: The patient does not participate in regular  exercise at present, Exercise limited by: None identified  Diet Patient reports consuming 2 meals a day and 1 snack(s) a day Patient reports that her primary diet is: Regular Patient reports that she does have regular access to food.   Depression Screen    06/25/2022    8:49 AM 01/22/2022   10:21 AM 01/22/2022   10:20 AM 12/20/2021    9:09 AM 12/20/2021    9:08 AM 12/20/2021    8:48 AM 09/21/2021   11:08 AM  PHQ 2/9 Scores  PHQ - 2 Score 0 '1 6 1 1 1 '$ 0  PHQ- 9 Score  '9  5   6     '$ Fall Risk    06/25/2022    8:48 AM 06/24/2022    9:40 AM 12/20/2021    8:47 AM 09/21/2021   11:07 AM 07/31/2021    8:19 AM  Fall Risk   Falls in the past year? '1 1 1 '$ 0 0  Number falls in past yr: 1 0 0 0 0  Injury with Fall? '1 1 1 '$ 0 0  Risk for fall due to : History of fall(s);Impaired balance/gait  History of fall(s) No Fall Risks No Fall Risks  Follow up Falls evaluation completed;Education provided;Falls prevention discussed  Falls evaluation completed;Education provided Falls evaluation completed Falls evaluation completed     Objective:  Erin [name] seemed alert and oriented and she participated appropriately during our telephone visit.  Blood Pressure Weight BMI  BP Readings from Last 3 Encounters:  05/28/22 124/81  05/09/22 122/84  03/19/22 126/64   Wt Readings from Last 3 Encounters:  05/28/22 129 lb (58.5 kg)  03/19/22 135 lb 6.4 oz (61.4 kg)  01/22/22 140 lb (63.5 kg)   BMI Readings from Last 1 Encounters:  05/28/22 20.20 kg/m    *Unable to obtain current vital signs, weight, and BMI due to telephone visit type  Hearing/Vision  Ameera did not seem to have difficulty with hearing/understanding during the telephone conversation Reports that she has had a formal eye exam by an eye care professional within the past year Reports that she has not had a formal hearing evaluation within the past year *Unable to fully assess hearing and vision during telephone visit type  Cognitive  Function:    06/25/2022    8:54 AM 11/28/2020   11:13 AM  6CIT Screen  What Year? 0 points 0 points  What month? 0 points 0 points  What time? 0 points 0 points  Count back from 20 0 points 0 points  Months in reverse 4 points 0 points  Repeat phrase 0 points 0 points  Total Score 4 points 0 points   (Normal:0-7, Significant for Dysfunction: >8)  Normal Cognitive Function Screening: Yes   Immunization & Health Maintenance Record Immunization History  Administered Date(s) Administered   Influenza,inj,Quad PF,6+ Mos 02/03/2019, 01/01/2020, 12/20/2021   PFIZER(Purple Top)SARS-COV-2 Vaccination 07/16/2019, 08/10/2019, 01/16/2020   Pneumococcal Polysaccharide-23 02/03/2015   Tdap 05/08/2010, 07/31/2021   Zoster Recombinat (Shingrix) 07/31/2021, 12/20/2021   Zoster, Live 01/04/2014    Health Maintenance  Topic Date Due   COVID-19 Vaccine (4 - 2023-24 season) 07/11/2022 (Originally 12/15/2021)   Hepatitis C Screening  06/25/2023 (Originally [birth year]/25/1978)   HIV Screening  06/25/2023 (Originally 05/26/1975)   Lung Cancer Screening  09/21/2022  Medicare Annual Wellness (AWV)  06/25/2023   MAMMOGRAM  06/13/2024   COLONOSCOPY (Pts 45-49yr Insurance coverage will need to be confirmed)  10/26/2028   DTaP/Tdap/Td (3 - Td or Tdap) 08/01/2031   INFLUENZA VACCINE  Completed   Zoster Vaccines- Shingrix  Completed   HPV VACCINES  Aged Out       Assessment  This is a routine wellness examination for [employer]  Health Maintenance: Due or Overdue There are no preventive care reminders to display for this patient.   AGibson Rampdoes not need a referral for Community Assistance: Care Management:   no Social Work:    no Prescription Assistance:  no Nutrition/Diabetes Education:  no   Plan:  Personalized Goals  Goals Addressed               This Visit's Progress     Patient Stated (pt-stated)        Patient states she would like to be more active.       Personalized  Health Maintenance & Screening Recommendations  Hep C screening and HIV screening  Lung Cancer Screening Recommended: Yes; up to date (Low Dose CT Chest recommended if Age 62-80years, 30 pack-year currently smoking OR have quit w/in past 15 years) Hepatitis C Screening recommended: no HIV Screening recommended: no  Advanced Directives: Written information was not prepared per patient's request.  Referrals & Orders No orders of the defined types were placed in this encounter.   Follow-up Plan Follow-up with WOwens Loffler DO as planned Medicare wellness visit in one year. Patient will access AVS on my chart.   I have personally reviewed and noted the following in the patient's chart:   Medical and social history Use of alcohol, tobacco or illicit drugs  Current medications and supplements Functional ability and status Nutritional status Physical activity Advanced directives List of other physicians Hospitalizations, surgeries, and ER visits in previous 12 months Vitals Screenings to include cognitive, depression, and falls Referrals and appointments  In addition, I have reviewed and discussed with AGibson Rampcertain preventive protocols, quality metrics, and best practice recommendations. A written personalized care plan for preventive services as well as general preventive health recommendations is available and can be mailed to the patient at her request.      BTinnie Gens RN BSN  06/25/2022

## 2022-06-27 ENCOUNTER — Encounter: Payer: Self-pay | Admitting: Family Medicine

## 2022-06-27 ENCOUNTER — Ambulatory Visit (INDEPENDENT_AMBULATORY_CARE_PROVIDER_SITE_OTHER): Payer: 59 | Admitting: Family Medicine

## 2022-06-27 VITALS — BP 117/71 | HR 67 | Ht 67.0 in | Wt 129.1 lb

## 2022-06-27 DIAGNOSIS — Z8781 Personal history of (healed) traumatic fracture: Secondary | ICD-10-CM | POA: Diagnosis not present

## 2022-06-27 DIAGNOSIS — E559 Vitamin D deficiency, unspecified: Secondary | ICD-10-CM | POA: Diagnosis not present

## 2022-06-27 DIAGNOSIS — F332 Major depressive disorder, recurrent severe without psychotic features: Secondary | ICD-10-CM

## 2022-06-27 DIAGNOSIS — F411 Generalized anxiety disorder: Secondary | ICD-10-CM

## 2022-06-27 MED ORDER — CLONAZEPAM 0.5 MG PO TABS: 0.2500 mg | ORAL_TABLET | Freq: Every day | ORAL | 1 refills | Status: DC | PRN

## 2022-06-27 MED ORDER — ESCITALOPRAM OXALATE 10 MG PO TABS: 10.0000 mg | ORAL_TABLET | Freq: Every day | ORAL | 3 refills | Status: DC

## 2022-06-27 MED ORDER — CLONAZEPAM 0.5 MG PO TABS: 0.2500 mg | ORAL_TABLET | Freq: Every day | ORAL | 0 refills | Status: DC | PRN

## 2022-06-27 NOTE — Assessment & Plan Note (Signed)
-   wrote letter for patient about needing a new apartment  - increased lexapro to 10mg   - gave 20 tablets of klonopin for patient to use as needed. She rarely uses them and last had refill in June  - discussed continuing therapy

## 2022-06-27 NOTE — Progress Notes (Signed)
Established patient visit   Patient: Erin [name]   DOB: 17-May-1960   62 y.o. Female  MRN: [id number] Visit Date: 06/27/2022  Today's healthcare provider: Owens Loffler, DO   Chief Complaint  Patient presents with   Follow-up    Med refill on klonopin  Wants letter to move into a new apartment with lighting due to depression     SUBJECTIVE    Chief Complaint  Patient presents with   Follow-up    Med refill on klonopin  Wants letter to move into a new apartment with lighting due to depression    HPI HPI     Follow-up    Additional comments: Med refill on klonopin  Wants letter to move into a new apartment with lighting due to depression       Last edited by Myrlene Broker, [place] on 06/27/2022  2:06 PM.      Pt presents for follow up.   MDD and anxiety - doing better with starting therapy  - would like letter for apartment complex to switch apartments to help with better sunlight exposure to decrease MDD symptoms  - would also like to increase lexapro  - pt does need a refill on klonipin she only uses it as needed. Last refill was in August and for 20 tabs and she has 2 left.    Review of Systems  Constitutional:  Negative for activity change, fatigue and fever.  Respiratory:  Negative for cough and shortness of breath.   Cardiovascular:  Negative for chest pain.  Gastrointestinal:  Negative for abdominal pain.  Genitourinary:  Negative for difficulty urinating.       Current Meds  Medication Sig   albuterol (VENTOLIN HFA) 108 (90 Base) MCG/ACT inhaler USE 1 TO 2 INHALATIONS BY MOUTH  EVERY 6 HOURS AS NEEDED FOR  WHEEZING OR SHORTNESS OF BREATH   BREZTRI AEROSPHERE 160-9-4.8 MCG/ACT AERO USE 2 INHALATIONS BY MOUTH INTO  THE LUNGS IN THE MORNING AND AT  BEDTIME   clotrimazole-betamethasone (LOTRISONE) cream Apply 1 Application topically 2 (two) times daily.   escitalopram (LEXAPRO) 10 MG tablet Take 1 tablet (10 mg total) by mouth daily.    Fluticasone-Umeclidin-Vilant (TRELEGY ELLIPTA) 200-62.5-25 MCG/ACT AEPB Inhale 1 puff into the lungs daily at 2 PM.   LINZESS 145 MCG CAPS capsule TAKE 1 CAPSULE(145 MCG) BY MOUTH DAILY   Magnesium 400 MG TABS Take by mouth 2 (two) times daily.   omeprazole (PRILOSEC) 40 MG capsule Take 1 capsule (40 mg total) by mouth daily.   ondansetron (ZOFRAN ODT) 8 MG disintegrating tablet Take 1 tablet (8 mg total) by mouth every 8 (eight) hours as needed for nausea or vomiting.   traZODone (DESYREL) 150 MG tablet Take 1 tablet (150 mg total) by mouth at bedtime.   triamcinolone (KENALOG) 0.025 % cream Apply 1 Application topically 3 (three) times daily as needed.   [DISCONTINUED] clonazePAM (KLONOPIN) 0.5 MG tablet Take 0.5-1 tablets (0.25-0.5 mg total) by mouth daily as needed for anxiety.   [DISCONTINUED] escitalopram (LEXAPRO) 5 MG tablet TAKE 1 TABLET(5 MG) BY MOUTH DAILY    OBJECTIVE    BP 117/71 (BP Location: Left Arm, Patient Position: Sitting, Cuff Size: Normal)   Pulse 67   Ht '5\' 7"'$  (1.702 m)   Wt 129 lb 1.9 oz (58.6 kg)   SpO2 97%   BMI 20.22 kg/m   Physical Exam Vitals and nursing note reviewed.  Constitutional:      General: She is  not in acute distress.    Appearance: Normal appearance.  HENT:     Head: Normocephalic and atraumatic.     Right Ear: External ear normal.     Left Ear: External ear normal.     Nose: Nose normal.  Eyes:     Conjunctiva/sclera: Conjunctivae normal.  Cardiovascular:     Rate and Rhythm: Normal rate and regular rhythm.  Pulmonary:     Effort: Pulmonary effort is normal.     Breath sounds: Normal breath sounds.  Neurological:     General: No focal deficit present.     Mental Status: She is alert and oriented to person, place, and time.  Psychiatric:        Mood and Affect: Mood normal.        Behavior: Behavior normal.        Thought Content: Thought content normal.        Judgment: Judgment normal.       ASSESSMENT & PLAN    Problem  List Items Addressed This Visit       Other   Severe episode of recurrent major depressive disorder, without psychotic features ([name]) - Primary    - wrote letter for patient about needing a new apartment  - increased lexapro to '10mg'$   - gave 20 tablets of klonopin for patient to use as needed. She rarely uses them and last had refill in June  - discussed continuing therapy      Relevant Medications   escitalopram (LEXAPRO) 10 MG tablet   Generalized anxiety disorder   Relevant Medications   escitalopram (LEXAPRO) 10 MG tablet   History of fracture due to fall   Vitamin D deficiency    - ordered vit d labs  - pt had hx of tibial fracture, ordering vit d level       Relevant Orders   Vitamin D (25 hydroxy)    Return in about 4 weeks (around 07/25/2022) for video visit.      Meds ordered this encounter  Medications   DISCONTD: clonazePAM (KLONOPIN) 0.5 MG tablet    Sig: Take 0.5-1 tablets (0.25-0.5 mg total) by mouth daily as needed for anxiety.    Dispense:  20 tablet    Refill:  1   escitalopram (LEXAPRO) 10 MG tablet    Sig: Take 1 tablet (10 mg total) by mouth daily.    Dispense:  30 tablet    Refill:  3   clonazePAM (KLONOPIN) 0.5 MG tablet    Sig: Take 0.5-1 tablets (0.25-0.5 mg total) by mouth daily as needed for anxiety.    Dispense:  20 tablet    Refill:  0    Orders Placed This Encounter  Procedures   Vitamin D (25 hydroxy)     Owens Loffler, DO  [place] [facility] [phone] (phone) [phone] (fax)  [place]

## 2022-06-27 NOTE — Assessment & Plan Note (Signed)
-   ordered vit d labs  - pt had hx of tibial fracture, ordering vit d level

## 2022-06-28 ENCOUNTER — Other Ambulatory Visit: Payer: Self-pay | Admitting: Family Medicine

## 2022-06-28 DIAGNOSIS — E559 Vitamin D deficiency, unspecified: Secondary | ICD-10-CM

## 2022-06-28 LAB — VITAMIN D 25 HYDROXY (VIT D DEFICIENCY, FRACTURES): Vit D, 25-Hydroxy: 27 ng/mL — ABNORMAL LOW (ref 30–100)

## 2022-06-28 MED ORDER — VITAMIN D3 25 MCG (1000 UT) PO CAPS: 1000.0000 [IU] | ORAL_CAPSULE | Freq: Every day | ORAL | 0 refills | Status: AC

## 2022-07-16 ENCOUNTER — Ambulatory Visit: Payer: Self-pay

## 2022-07-16 ENCOUNTER — Ambulatory Visit (INDEPENDENT_AMBULATORY_CARE_PROVIDER_SITE_OTHER): Payer: 59 | Admitting: Sports Medicine

## 2022-07-16 ENCOUNTER — Ambulatory Visit (INDEPENDENT_AMBULATORY_CARE_PROVIDER_SITE_OTHER): Payer: 59

## 2022-07-16 ENCOUNTER — Telehealth: Payer: Self-pay | Admitting: Sports Medicine

## 2022-07-16 DIAGNOSIS — S73192A Other sprain of left hip, initial encounter: Secondary | ICD-10-CM | POA: Insufficient documentation

## 2022-07-16 DIAGNOSIS — M25551 Pain in right hip: Secondary | ICD-10-CM | POA: Diagnosis not present

## 2022-07-16 DIAGNOSIS — M47816 Spondylosis without myelopathy or radiculopathy, lumbar region: Secondary | ICD-10-CM

## 2022-07-16 DIAGNOSIS — S73192S Other sprain of left hip, sequela: Secondary | ICD-10-CM | POA: Diagnosis not present

## 2022-07-16 DIAGNOSIS — G8929 Other chronic pain: Secondary | ICD-10-CM

## 2022-07-16 DIAGNOSIS — S73192D Other sprain of left hip, subsequent encounter: Secondary | ICD-10-CM

## 2022-07-16 DIAGNOSIS — M25552 Pain in left hip: Secondary | ICD-10-CM | POA: Diagnosis not present

## 2022-07-16 NOTE — Assessment & Plan Note (Addendum)
Melitza has chronic bilateral hip pain, initially suspected to be greater trochanteric bursitis, she has had several greater trochanteric bursa injections, she also has some lumbar spondylitic processes, she had an L4-L5 epidural which seem to help significantly. Today her pain is predominately left hip, she does use the C sign to describe her pain, and she does have some discomfort with internal rotation of the hip. She had a trochanteric bursa injection bilateral January 8 of this year, so we will not do any more greater trochanteric bursa injections, today we placed medication in her left hip joint. I would like to get x-rays and an MRI of her left hip as well.  Update: No relief and MRI does confirm labral injury but also I think the dominant finding is gluteus medius tearing with significant edema, she has been through multiple injections, therapy, I would like orthopedic surgery to weigh in.

## 2022-07-16 NOTE — Telephone Encounter (Signed)
She did really well after an epidural, so I am going to order repeat epidural at like to see her back a month after she gets the shot.

## 2022-07-16 NOTE — Telephone Encounter (Signed)
Patient called stated can't walk her sciatica hurts she is  requesting to see you asap I can't overbook and I told her that I would let you know .

## 2022-07-16 NOTE — Progress Notes (Addendum)
    Procedures performed today:    Procedure: Real-time Ultrasound Guided injection of the left hip joint Device: Samsung HS60  Verbal informed consent obtained.  Time-out conducted.  Noted no overlying erythema, induration, or other signs of local infection.  Skin prepped in a sterile fashion.  Local anesthesia: Topical Ethyl chloride.  With sterile technique and under real time ultrasound guidance: Mild arthritic joint noted, 1 cc Kenalog 40, 2 cc lidocaine, 2 cc bupivacaine injected easily Completed without difficulty  Advised to call if fevers/chills, erythema, induration, drainage, or persistent bleeding.  Images permanently stored and available for review in PACS.  Impression: Technically successful ultrasound guided injection.  Independent interpretation of notes and tests performed by another provider:   None.  Brief History, Exam, Impression, and Recommendations:    Labral tear of left hip joint Erin [name] has chronic bilateral hip pain, initially suspected to be greater trochanteric bursitis, she has had several greater trochanteric bursa injections, she also has some lumbar spondylitic processes, she had an L4-L5 epidural which seem to help significantly. Today her pain is predominately left hip, she does use the C sign to describe her pain, and she does have some discomfort with internal rotation of the hip. She had a trochanteric bursa injection bilateral January 8 of this year, so we will not do any more greater trochanteric bursa injections, today we placed medication in her left hip joint. I would like to get x-rays and an MRI of her left hip as well.  Update: No relief and MRI does confirm labral injury but also I think the dominant finding is gluteus medius tearing with significant edema, she has been through multiple injections, therapy, I would like orthopedic surgery to weigh in.  Chronic hip pain, bilateral Injury has chronic hip pain, she has had this for several  years, she has been through physical therapy, multiple injections, historically this resembles a trochanteric bursitis. Unfortunately continues to have discomfort, we did a hip MRI and hip joint injection that did show some labral injury but also significant gluteus medius tearing, at this point I would like orthopedic surgery to weigh in.    ____________________________________________ Erin [name]. Erin [name], M.D., ABFM., CAQSM., AME. Primary Care and Sports Medicine [facility]  Adjunct Professor of Family Medicine  [place] [employer]  Restaurant manager, fast food

## 2022-07-21 ENCOUNTER — Ambulatory Visit (INDEPENDENT_AMBULATORY_CARE_PROVIDER_SITE_OTHER): Payer: 59

## 2022-07-21 DIAGNOSIS — M25552 Pain in left hip: Secondary | ICD-10-CM | POA: Diagnosis not present

## 2022-07-21 DIAGNOSIS — S73192S Other sprain of left hip, sequela: Secondary | ICD-10-CM

## 2022-07-24 MED ORDER — HYDROCODONE-ACETAMINOPHEN 10-325 MG PO TABS: 1.0000 | ORAL_TABLET | Freq: Three times a day (TID) | ORAL | 0 refills | Status: DC | PRN

## 2022-07-24 NOTE — Addendum Note (Signed)
Addended by: Monica Becton on: 07/24/2022 05:31 PM   Modules accepted: Orders

## 2022-07-24 NOTE — Assessment & Plan Note (Signed)
Injury has chronic hip pain, she has had this for several years, she has been through physical therapy, multiple injections, historically this resembles a trochanteric bursitis. Unfortunately continues to have discomfort, we did a hip MRI and hip joint injection that did show some labral injury but also significant gluteus medius tearing, at this point I would like orthopedic surgery to weigh in.

## 2022-07-25 NOTE — Progress Notes (Unsigned)
     Established patient visit   Patient: Erin [name]   DOB: [birth year]   62 y.o. Female  MRN: [id number] Visit Date: 07/26/2022  Today's healthcare provider: Charlton Amor, DO   No chief complaint on file.   SUBJECTIVE   No chief complaint on file.  I connected with  Erin [name] on 07/25/22 by a video and audio enabled telemedicine application and verified that I am speaking with the correct person using two identifiers.  Patient Location: Home  Provider Location: Office/Clinic  I discussed the limitations of evaluation and management by telemedicine. The patient expressed understanding and agreed to proceed.  HPI  Pt presents via telehealth for lexapro follow up. She is on lexapro 10mg   Review of Systems     No outpatient medications have been marked as taking for the 07/26/22 encounter (Appointment) with Charlton Amor, DO.    OBJECTIVE    There were no vitals taken for this visit.  Physical Exam       ASSESSMENT & PLAN    Problem List Items Addressed This Visit   None   No follow-ups on file.      No orders of the defined types were placed in this encounter.   No orders of the defined types were placed in this encounter.    Charlton Amor, DO  [facility] Primary Care & Sports Medicine at [facility] [phone] (phone) [phone] (fax)  [facility]

## 2022-07-26 ENCOUNTER — Encounter: Payer: Self-pay | Admitting: Family Medicine

## 2022-07-26 ENCOUNTER — Telehealth (INDEPENDENT_AMBULATORY_CARE_PROVIDER_SITE_OTHER): Payer: 59 | Admitting: Family Medicine

## 2022-07-26 VITALS — Ht 67.0 in | Wt 129.2 lb

## 2022-07-26 DIAGNOSIS — F332 Major depressive disorder, recurrent severe without psychotic features: Secondary | ICD-10-CM

## 2022-07-26 DIAGNOSIS — F411 Generalized anxiety disorder: Secondary | ICD-10-CM | POA: Diagnosis not present

## 2022-07-26 MED ORDER — ESCITALOPRAM OXALATE 5 MG PO TABS
5.0000 mg | ORAL_TABLET | Freq: Every day | ORAL | 3 refills | Status: DC
Start: 2022-07-26 — End: 2022-09-16

## 2022-07-26 NOTE — Assessment & Plan Note (Signed)
-   pt decreased lexapro to 5mg   - continue lexapro 5mg   - denies suicidal and homicidal ideation

## 2022-07-30 ENCOUNTER — Encounter: Payer: Self-pay | Admitting: *Deleted

## 2022-08-03 ENCOUNTER — Ambulatory Visit (INDEPENDENT_AMBULATORY_CARE_PROVIDER_SITE_OTHER): Payer: 59 | Admitting: Family Medicine

## 2022-08-03 ENCOUNTER — Other Ambulatory Visit: Payer: Self-pay | Admitting: Sports Medicine

## 2022-08-03 ENCOUNTER — Encounter: Payer: Self-pay | Admitting: Family Medicine

## 2022-08-03 ENCOUNTER — Ambulatory Visit (INDEPENDENT_AMBULATORY_CARE_PROVIDER_SITE_OTHER): Payer: 59

## 2022-08-03 VITALS — BP 114/79 | HR 71 | Temp 98.0°F | Ht 67.0 in | Wt 127.1 lb

## 2022-08-03 DIAGNOSIS — G8929 Other chronic pain: Secondary | ICD-10-CM

## 2022-08-03 DIAGNOSIS — R051 Acute cough: Secondary | ICD-10-CM | POA: Insufficient documentation

## 2022-08-03 DIAGNOSIS — J441 Chronic obstructive pulmonary disease with (acute) exacerbation: Secondary | ICD-10-CM | POA: Insufficient documentation

## 2022-08-03 DIAGNOSIS — J329 Chronic sinusitis, unspecified: Secondary | ICD-10-CM | POA: Diagnosis not present

## 2022-08-03 DIAGNOSIS — R059 Cough, unspecified: Secondary | ICD-10-CM

## 2022-08-03 DIAGNOSIS — E559 Vitamin D deficiency, unspecified: Secondary | ICD-10-CM

## 2022-08-03 DIAGNOSIS — J40 Bronchitis, not specified as acute or chronic: Secondary | ICD-10-CM

## 2022-08-03 DIAGNOSIS — R062 Wheezing: Secondary | ICD-10-CM

## 2022-08-03 DIAGNOSIS — R5383 Other fatigue: Secondary | ICD-10-CM | POA: Insufficient documentation

## 2022-08-03 MED ORDER — METHYLPREDNISOLONE 4 MG PO TBPK
ORAL_TABLET | ORAL | 0 refills | Status: DC
Start: 2022-08-03 — End: 2022-08-08

## 2022-08-03 MED ORDER — DOXYCYCLINE HYCLATE 100 MG PO TABS
100.0000 mg | ORAL_TABLET | Freq: Two times a day (BID) | ORAL | 0 refills | Status: AC
Start: 2022-08-03 — End: 2022-08-10

## 2022-08-03 MED ORDER — HYDROCODONE-ACETAMINOPHEN 10-325 MG PO TABS: 1.0000 | ORAL_TABLET | Freq: Three times a day (TID) | ORAL | 0 refills | Status: DC | PRN

## 2022-08-03 MED ORDER — HYDROCOD POLI-CHLORPHE POLI ER 10-8 MG/5ML PO SUER
5.0000 mL | Freq: Two times a day (BID) | ORAL | 0 refills | Status: DC | PRN
Start: 2022-08-03 — End: 2022-08-24

## 2022-08-03 NOTE — Progress Notes (Signed)
Established patient visit   Patient: Erin [name]   DOB: Jul 26, 1960   62 y.o. Female  MRN: [id number] Visit Date: 08/03/2022  Today's healthcare provider: Charlton Amor, DO   Chief Complaint  Patient presents with   Cough    Starting 2-3 days ago. She has not taken anything for pain.    Chills    She denies fever.     SUBJECTIVE    Chief Complaint  Patient presents with   Cough    Starting 2-3 days ago. She has not taken anything for pain.    Chills    She denies fever.    Cough Associated symptoms include wheezing. Pertinent negatives include no chest pain, fever or shortness of breath.   Patient says she has had a few days of increasing cough as well as sputum production.  She feels like nothing is helping.  She also notes weeks to months of increased fatigue.  Review of Systems  Constitutional:  Negative for activity change, fatigue and fever.  Respiratory:  Positive for cough and wheezing. Negative for shortness of breath.   Cardiovascular:  Negative for chest pain.  Gastrointestinal:  Negative for abdominal pain.  Genitourinary:  Negative for difficulty urinating.       Current Meds  Medication Sig   albuterol (VENTOLIN HFA) 108 (90 Base) MCG/ACT inhaler USE 1 TO 2 INHALATIONS BY MOUTH  EVERY 6 HOURS AS NEEDED FOR  WHEEZING OR SHORTNESS OF BREATH   BREZTRI AEROSPHERE 160-9-4.8 MCG/ACT AERO USE 2 INHALATIONS BY MOUTH INTO  THE LUNGS IN THE MORNING AND AT  BEDTIME   chlorpheniramine-HYDROcodone (TUSSIONEX) 10-8 MG/5ML Take 5 mLs by mouth every 12 (twelve) hours as needed for cough (cough, will cause drowsiness.).   Cholecalciferol (VITAMIN D3) 25 MCG (1000 UT) capsule Take 1 capsule (1,000 Units total) by mouth daily.   clonazePAM (KLONOPIN) 0.5 MG tablet Take 0.5-1 tablets (0.25-0.5 mg total) by mouth daily as needed for anxiety.   clotrimazole-betamethasone (LOTRISONE) cream Apply 1 Application topically 2 (two) times daily.   doxycycline (VIBRA-TABS) 100 MG  tablet Take 1 tablet (100 mg total) by mouth 2 (two) times daily for 7 days.   escitalopram (LEXAPRO) 5 MG tablet Take 1 tablet (5 mg total) by mouth daily.   Fluticasone-Umeclidin-Vilant (TRELEGY ELLIPTA) 200-62.5-25 MCG/ACT AEPB Inhale 1 puff into the lungs daily at 2 PM.   LINZESS 145 MCG CAPS capsule TAKE 1 CAPSULE(145 MCG) BY MOUTH DAILY   Magnesium 400 MG TABS Take by mouth 2 (two) times daily.   methylPREDNISolone (MEDROL DOSEPAK) 4 MG TBPK tablet As instructed on pill pack   omeprazole (PRILOSEC) 40 MG capsule Take 1 capsule (40 mg total) by mouth daily.   ondansetron (ZOFRAN ODT) 8 MG disintegrating tablet Take 1 tablet (8 mg total) by mouth every 8 (eight) hours as needed for nausea or vomiting.   traZODone (DESYREL) 150 MG tablet Take 1 tablet (150 mg total) by mouth at bedtime.   triamcinolone (KENALOG) 0.025 % cream Apply 1 Application topically 3 (three) times daily as needed.    OBJECTIVE    BP 114/79   Pulse 71   Temp 98 F (36.7 C) (Oral)   Ht  (1.702 m)   Wt 127 lb 1.9 oz (57.7 kg)   SpO2 97%   BMI 19.91 kg/m   Physical Exam Vitals and nursing note reviewed.  Constitutional:      General: She is not in acute distress.    Appearance:  She is ill-appearing.  HENT:     Head: Normocephalic and atraumatic.     Right Ear: External ear normal.     Left Ear: External ear normal.     Nose: Nose normal.  Eyes:     Conjunctiva/sclera: Conjunctivae normal.  Cardiovascular:     Rate and Rhythm: Normal rate and regular rhythm.  Pulmonary:     Effort: Pulmonary effort is normal.     Breath sounds: Wheezing present.     Comments: Cough  Neurological:     General: No focal deficit present.     Mental Status: She is alert and oriented to person, place, and time.  Psychiatric:        Mood and Affect: Mood normal.        Behavior: Behavior normal.        Thought Content: Thought content normal.        Judgment: Judgment normal.         ASSESSMENT & PLAN     Problem List Items Addressed This Visit       Respiratory   COPD with acute exacerbation    Patient presents with increase in sputum production as well as cough.  She does have a history of COPD and this is likely a COPD exacerbation.  She has noticed an increase in wheezing.  Have gone ahead and given doxycycline as well as Medrol Dosepak to help decrease some inflammation.  Will also give patient Desenex to help her get some relief. - Have ordered chest x-ray to rule out pneumonia.      Relevant Medications   chlorpheniramine-HYDROcodone (TUSSIONEX) 10-8 MG/5ML   methylPREDNISolone (MEDROL DOSEPAK) 4 MG TBPK tablet   doxycycline (VIBRA-TABS) 100 MG tablet   RESOLVED: Sinobronchitis    Patient notes increase in cough as well as sputum production.      Relevant Medications   chlorpheniramine-HYDROcodone (TUSSIONEX) 10-8 MG/5ML   methylPREDNISolone (MEDROL DOSEPAK) 4 MG TBPK tablet   doxycycline (VIBRA-TABS) 100 MG tablet     Other   Vitamin D deficiency - Primary    She notes feeling additional fatigue has been feeling this way for a few weeks now.  I will go ahead and check her vitamin D level.  She is on a supplement but I want to make sure she is on an appropriate supplement and her body is absorbing the vitamin D appropriately.      Relevant Orders   Vitamin D (25 hydroxy)   Fatigue    Patient notes severe fatigue.  This could be related to recent illness however patient says this has been going on for months now.  I have ordered a vitamin D, vitamin B12 as well as TSH.      Relevant Orders   Vitamin D (25 hydroxy)   Vitamin B12   TSH + free T4   Acute cough   Relevant Medications   chlorpheniramine-HYDROcodone (TUSSIONEX) 10-8 MG/5ML   Other Relevant Orders   DG Chest 2 View    No follow-ups on file.      Meds ordered this encounter  Medications   chlorpheniramine-HYDROcodone (TUSSIONEX) 10-8 MG/5ML    Sig: Take 5 mLs by mouth every 12 (twelve) hours as needed  for cough (cough, will cause drowsiness.).    Dispense:  120 mL    Refill:  0   methylPREDNISolone (MEDROL DOSEPAK) 4 MG TBPK tablet    Sig: As instructed on pill pack    Dispense:  21 tablet  Refill:  0   doxycycline (VIBRA-TABS) 100 MG tablet    Sig: Take 1 tablet (100 mg total) by mouth 2 (two) times daily for 7 days.    Dispense:  14 tablet    Refill:  0    Orders Placed This Encounter  Procedures   DG Chest 2 View    Standing Status:   Future    Standing Expiration Date:   08/03/2023    Order Specific Question:   Preferred imaging location?    Answer:   [facility]    Order Specific Question:   Reason for exam:    Answer:   wheezing    Order Specific Question:   Release to patient    Answer:   Immediate   Vitamin D (25 hydroxy)   Vitamin B12   TSH + free T4     Charlton Amor, DO  [facility] Primary Care & Sports Medicine at [facility] [phone] (phone) [phone] (fax)  [facility]

## 2022-08-03 NOTE — Assessment & Plan Note (Signed)
She notes feeling additional fatigue has been feeling this way for a few weeks now.  I will go ahead and check her vitamin D level.  She is on a supplement but I want to make sure she is on an appropriate supplement and her body is absorbing the vitamin D appropriately.

## 2022-08-03 NOTE — Assessment & Plan Note (Signed)
Patient notes increase in cough as well as sputum production.

## 2022-08-03 NOTE — Assessment & Plan Note (Signed)
Patient notes severe fatigue.  This could be related to recent illness however patient says this has been going on for months now.  I have ordered a vitamin D, vitamin B12 as well as TSH.

## 2022-08-03 NOTE — Assessment & Plan Note (Signed)
Patient presents with increase in sputum production as well as cough.  She does have a history of COPD and this is likely a COPD exacerbation.  She has noticed an increase in wheezing.  Have gone ahead and given doxycycline as well as Medrol Dosepak to help decrease some inflammation.  Will also give patient Desenex to help her get some relief. - Have ordered chest x-ray to rule out pneumonia.

## 2022-08-03 NOTE — Patient Instructions (Signed)
Take Antibiotic: doxycycline Steroid: medrol dose pack to help with inflammation  Cough medicine: tussionex   Get chest xray done

## 2022-08-04 LAB — TSH+FREE T4: TSH W/REFLEX TO FT4: 1.02 mIU/L (ref 0.40–4.50)

## 2022-08-04 LAB — VITAMIN B12: Vitamin B-12: 177 pg/mL — ABNORMAL LOW (ref 200–1100)

## 2022-08-04 LAB — VITAMIN D 25 HYDROXY (VIT D DEFICIENCY, FRACTURES): Vit D, 25-Hydroxy: 31 ng/mL (ref 30–100)

## 2022-08-07 ENCOUNTER — Telehealth: Payer: Self-pay

## 2022-08-07 NOTE — Telephone Encounter (Signed)
Patient scheduled as a nurse visit for initial B12 injection tomorrow morning. How often did you want her to return for injections? Every 14  or 30 days ? Thank you :)

## 2022-08-08 ENCOUNTER — Ambulatory Visit (INDEPENDENT_AMBULATORY_CARE_PROVIDER_SITE_OTHER): Payer: 59 | Admitting: Family Medicine

## 2022-08-08 VITALS — BP 125/67 | HR 70

## 2022-08-08 DIAGNOSIS — D510 Vitamin B12 deficiency anemia due to intrinsic factor deficiency: Secondary | ICD-10-CM

## 2022-08-08 MED ORDER — CYANOCOBALAMIN 1000 MCG/ML IJ SOLN
1000.0000 ug | Freq: Once | INTRAMUSCULAR | Status: AC
Start: 2022-08-08 — End: 2022-08-08
  Administered 2022-08-08: 1000 ug via INTRAMUSCULAR

## 2022-08-08 NOTE — Progress Notes (Signed)
   Established Patient Office Visit  Subjective   Patient ID: Erin [name], female    DOB: 07-11-1960  Age: 62 y.o. MRN: [id number]  Chief Complaint  Patient presents with   Pernicious Anemia    HPI  Jalon Blackwelder is here for a vitamin B 12 injection.    ROS    Objective:     BP 125/67   Pulse 70   SpO2 97%    Physical Exam   No results found for any visits on 08/08/22.    The ASCVD Risk score (Arnett DK, et al., 2019) failed to calculate for the following reasons:   The patient has a prior MI or stroke diagnosis    Assessment & Plan:  Pernicious anemia - Patient tolerated injection well without complications. Patient advised to schedule next injection 30 days from today.    Problem List Items Addressed This Visit   None Visit Diagnoses     Pernicious anemia    -  Primary   Relevant Medications   cyanocobalamin (VITAMIN B12) injection 1,000 mcg (Completed) (Start on 08/08/2022  9:00 AM)       Return in about 1 month (around 09/07/2022) for B12 injection. Earna Coder, Janalyn Harder, CMA  Medical screening examination/treatment was performed by qualified clinical staff member and as supervising physician I was immediately available for consultation/collaboration. I have reviewed documentation and agree with assessment and plan.  Colbert Coyer. Tamera Punt, DO

## 2022-08-24 ENCOUNTER — Ambulatory Visit (INDEPENDENT_AMBULATORY_CARE_PROVIDER_SITE_OTHER): Payer: 59 | Admitting: Sports Medicine

## 2022-08-24 DIAGNOSIS — S73192S Other sprain of left hip, sequela: Secondary | ICD-10-CM

## 2022-08-24 DIAGNOSIS — M47816 Spondylosis without myelopathy or radiculopathy, lumbar region: Secondary | ICD-10-CM

## 2022-08-24 MED ORDER — OXYCODONE-ACETAMINOPHEN 5-325 MG PO TABS: 1.0000 | ORAL_TABLET | Freq: Three times a day (TID) | ORAL | 0 refills | Status: DC | PRN

## 2022-08-24 NOTE — Assessment & Plan Note (Signed)
Erin [name] has chronic bilateral hip pain, she has had multiple injections with ultrasound guidance around the hip abductor's, more recently she localized her pain more in the groin, MR arthrography did confirm labral tear. It also showed some gluteus medius tearing with significant edema. This has been fairly chronic, she did see Dr. Christell Constant with [facility] who suggested she see Dr. Jordan Likes for discussion of addressing the tendon. Erin [name] is very frustrated, she is in pain and she is not happy that her appointment is not until June. I am going to help control her pain, we will bump her up to low-dose oxycodone, she understands that this is a short-term supply and we will see if we can get her in any sooner.

## 2022-08-24 NOTE — Progress Notes (Signed)
    Procedures performed today:    None.  Independent interpretation of notes and tests performed by another provider:   None.  Brief History, Exam, Impression, and Recommendations:    Labral tear of left hip joint Erin [name] has chronic bilateral hip pain, she has had multiple injections with ultrasound guidance around the hip abductor's, more recently she localized her pain more in the groin, MR arthrography did confirm labral tear. It also showed some gluteus medius tearing with significant edema. This has been fairly chronic, she did see Dr. Christell Constant with [facility] who suggested she see Dr. Jordan Likes for discussion of addressing the tendon. Erin [name] is very frustrated, she is in pain and she is not happy that her appointment is not until June. I am going to help control her pain, we will bump her up to low-dose oxycodone, she understands that this is a short-term supply and we will see if we can get her in any sooner.  Lumbar spondylosis She also has significant back pain worsened with her hip abductor problem, she has trouble getting to [facility] for injections but has done well with L4-L5 interlaminar epidurals, I am hoping we can switch her more locally and Dr. Laurian Brim can do her epidurals from now on, I think she is a candidate for another 1 now.    ____________________________________________ Ihor Austin. Benjamin Stain, M.D., ABFM., CAQSM., AME. Primary Care and Sports Medicine [place] [facility]  Adjunct Professor of Family Medicine  [place]  Restaurant manager, fast food

## 2022-08-24 NOTE — Assessment & Plan Note (Signed)
She also has significant back pain worsened with her hip abductor problem, she has trouble getting to [facility] for injections but has done well with L4-L5 interlaminar epidurals, I am hoping we can switch her more locally and Dr. Laurian Brim can do her epidurals from now on, I think she is a candidate for another 1 now.

## 2022-09-07 ENCOUNTER — Ambulatory Visit (INDEPENDENT_AMBULATORY_CARE_PROVIDER_SITE_OTHER): Payer: 59 | Admitting: Medical-Surgical

## 2022-09-07 VITALS — BP 125/71 | HR 86 | Ht 67.0 in | Wt 123.0 lb

## 2022-09-07 DIAGNOSIS — D510 Vitamin B12 deficiency anemia due to intrinsic factor deficiency: Secondary | ICD-10-CM

## 2022-09-07 MED ORDER — CYANOCOBALAMIN 1000 MCG/ML IJ SOLN
1000.0000 ug | Freq: Once | INTRAMUSCULAR | Status: AC
Start: 2022-09-07 — End: 2022-09-07
  Administered 2022-09-07: 1000 ug via INTRAMUSCULAR

## 2022-09-07 NOTE — Progress Notes (Signed)
Agree with documentation as below.  ___________________________________________ Marlayna Bannister L. Jovane Foutz, DNP, APRN, FNP-BC Primary Care and Sports Medicine [facility]  

## 2022-09-07 NOTE — Progress Notes (Signed)
Patient is here for a vitamin B12 injection. Denies GI problems or dizziness.   Patient tolerated injection well without complications. Pt is advised to schedule next injection in 30 days.  Location: RD  Pt sates she fell yesterday. Tripping with the dogs. Hit her head, injured left hip, and lower back pain. Pt was offered a Provider visit. Declined visit. Advised pt to see Urgent Care over the weekend, if necessary. Pt agreed to treatment plan.

## 2022-09-16 ENCOUNTER — Other Ambulatory Visit: Payer: Self-pay | Admitting: Family Medicine

## 2022-09-19 ENCOUNTER — Telehealth: Payer: Self-pay

## 2022-09-19 NOTE — Transitions of Care (Post Inpatient/ED Visit) (Signed)
09/19/2022  Name: Erin [name] MRN: [id number] DOB: 05-18-1960  Today's TOC FU Call Status: Today's TOC FU Call Status:: Successful TOC FU Call Competed TOC FU Call Complete Date: 09/19/22  Transition Care Management Follow-up Telephone Call Date of Discharge: 09/18/22 Discharge Facility: Other ([facility]) Name of Other ([facility]) Discharge Facility: [facility] Type of Discharge: Inpatient Admission Primary Inpatient Discharge Diagnosis:: tendon tear How have you been since you were released from the hospital?: Better Any questions or concerns?: No  Items Reviewed: Did you receive and understand the discharge instructions provided?: Yes Medications obtained,verified, and reconciled?: Yes (Medications Reviewed) Any new allergies since your discharge?: No Dietary orders reviewed?: Yes Do you have support at home?: No  Medications Reviewed Today: Medications Reviewed Today     Reviewed by Karena Addison, LPN (Licensed Practical Nurse) on 09/19/22 at [phone]  Med List Status: <None>   Medication Order Taking? Sig Documenting Provider Last Dose Status Informant  albuterol (VENTOLIN HFA) 108 (90 Base) MCG/ACT inhaler [id number] Yes USE 1 TO 2 INHALATIONS BY MOUTH  EVERY 6 HOURS AS NEEDED FOR  WHEEZING OR SHORTNESS OF Kandis Ban, MD Taking Active   BREZTRI AEROSPHERE 160-9-4.8 MCG/ACT Sandrea Matte [id number] Yes USE 2 INHALATIONS BY MOUTH INTO  THE LUNGS IN THE MORNING AND AT  BEDTIME Luciano Cutter, MD Taking Active   Cholecalciferol (VITAMIN D3) 25 MCG (1000 UT) capsule [id number] Yes Take 1 capsule (1,000 Units total) by mouth daily. Charlton Amor, DO Taking Active   clonazePAM (KLONOPIN) 0.5 MG tablet [id number] Yes Take 0.5-1 tablets (0.25-0.5 mg total) by mouth daily as needed for anxiety. Charlton Amor, DO Taking Active   clotrimazole-betamethasone (LOTRISONE) cream [id number] Yes Apply 1 Application topically 2 (two) times daily. Lesly Dukes, MD Taking  Active   escitalopram (LEXAPRO) 5 MG tablet [id number] Yes TAKE 1 TABLET(5 MG) BY MOUTH DAILY Charlton Amor, DO Taking Active   Fluticasone-Umeclidin-Vilant (TRELEGY ELLIPTA) 200-62.5-25 MCG/ACT AEPB [id number] Yes Inhale 1 puff into the lungs daily at 2 PM. Luciano Cutter, MD Taking Active   LINZESS 145 MCG CAPS capsule [id number] Yes TAKE 1 CAPSULE(145 MCG) BY MOUTH DAILY Clayborne Dana, NP Taking Active Self  Magnesium 400 MG TABS [id number] Yes Take by mouth 2 (two) times daily. [provider] Taking Active   omeprazole (PRILOSEC) 40 MG capsule [id number] Yes Take 1 capsule (40 mg total) by mouth daily. Christen Butter, NP Taking Active   ondansetron (ZOFRAN ODT) 8 MG disintegrating tablet [id number] Yes Take 1 tablet (8 mg total) by mouth every 8 (eight) hours as needed for nausea or vomiting. Christen Butter, NP Taking Active   oxyCODONE-acetaminophen (PERCOCET/ROXICET) 5-325 MG tablet [id number] Yes Take 1 tablet by mouth every 8 (eight) hours as needed. Monica Becton, MD Taking Active   traZODone (DESYREL) 150 MG tablet [id number] Yes Take 1 tablet (150 mg total) by mouth at bedtime. Monica Becton, MD Taking Active   triamcinolone (KENALOG) 0.025 % cream [id number] Yes Apply 1 Application topically 3 (three) times daily as needed. Bing Neighbors, NP Taking Active             Home Care and Equipment/Supplies: Were Home Health Services Ordered?: NA Any new equipment or medical supplies ordered?: NA  Functional Questionnaire: Do you need assistance with bathing/showering or dressing?: Yes Do you need assistance with meal preparation?: Yes Do you need assistance with eating?: No Do you have difficulty maintaining continence: No Do you need assistance with getting  out of bed/getting out of a chair/moving?: No Do you have difficulty managing or taking your medications?: No  Follow up appointments reviewed: PCP Follow-up appointment confirmed?: NA Specialist  Hospital Follow-up appointment confirmed?: Yes Date of Specialist follow-up appointment?: 10/02/22 Follow-Up Specialty Provider:: surgeon Do you need transportation to your follow-up appointment?: No Do you understand care options if your condition(s) worsen?: Yes-patient verbalized understanding    SIGNATURE Karena Addison, LPN [facility] Nurse Health Advisor Direct Dial [phone]

## 2022-10-04 ENCOUNTER — Ambulatory Visit: Payer: 59

## 2022-11-28 ENCOUNTER — Encounter: Payer: Self-pay | Admitting: Family Medicine

## 2022-11-28 ENCOUNTER — Ambulatory Visit (INDEPENDENT_AMBULATORY_CARE_PROVIDER_SITE_OTHER): Payer: 59 | Admitting: Family Medicine

## 2022-11-28 VITALS — BP 115/75 | HR 74 | Resp 18 | Ht 67.0 in | Wt 124.2 lb

## 2022-11-28 DIAGNOSIS — G4700 Insomnia, unspecified: Secondary | ICD-10-CM

## 2022-11-28 DIAGNOSIS — F411 Generalized anxiety disorder: Secondary | ICD-10-CM

## 2022-11-28 MED ORDER — CLONAZEPAM 0.5 MG PO TABS
0.2500 mg | ORAL_TABLET | Freq: Every day | ORAL | 0 refills | Status: DC | PRN
Start: 2022-11-28 — End: 2022-11-28

## 2022-11-28 MED ORDER — HYDROXYZINE HCL 50 MG PO TABS: 50.0000 mg | ORAL_TABLET | Freq: Every day | ORAL | 3 refills | Status: DC

## 2022-11-28 MED ORDER — CLONAZEPAM 0.5 MG PO TABS
0.2500 mg | ORAL_TABLET | Freq: Every day | ORAL | 0 refills | Status: DC | PRN
Start: 2022-11-28 — End: 2023-01-29

## 2022-11-28 NOTE — Assessment & Plan Note (Signed)
Did do a refill of Klonopin.  Patient seldomly uses this medicine and has not had a refill in 6 months.  Will go ahead and refill today.

## 2022-11-28 NOTE — Progress Notes (Signed)
Established patient visit   Patient: Erin [name]   DOB: 10-27-1960   62 y.o. Female  MRN: [id number] Visit Date: 11/28/2022  Today's healthcare provider: Charlton Amor, DO   Chief Complaint  Patient presents with   Medication Problem    Pt comes in today with complaints of her trazadone not working pt has been taking 200mg  x1wk    SUBJECTIVE    Chief Complaint  Patient presents with   Medication Problem    Pt comes in today with complaints of her trazadone not working pt has been taking 200mg  x1wk   HPI  Pt presents for discussion on insomnia.  She said that she has been taking trazodone and it has not been helping.  She does have a family that took hydroxyzine 50 mg and has noted good improvement with sleep as well as anxiety.  Patient does need a refill on her Klonopin.  She has not had refills in some months now.  Review of Systems  Constitutional:  Negative for activity change, fatigue and fever.  Respiratory:  Negative for cough and shortness of breath.   Cardiovascular:  Negative for chest pain.  Gastrointestinal:  Negative for abdominal pain.  Genitourinary:  Negative for difficulty urinating.       Current Meds  Medication Sig   albuterol (VENTOLIN HFA) 108 (90 Base) MCG/ACT inhaler USE 1 TO 2 INHALATIONS BY MOUTH  EVERY 6 HOURS AS NEEDED FOR  WHEEZING OR SHORTNESS OF BREATH   BREZTRI AEROSPHERE 160-9-4.8 MCG/ACT AERO USE 2 INHALATIONS BY MOUTH INTO  THE LUNGS IN THE MORNING AND AT  BEDTIME   Cholecalciferol (VITAMIN D3) 25 MCG (1000 UT) capsule Take 1 capsule (1,000 Units total) by mouth daily.   clotrimazole-betamethasone (LOTRISONE) cream Apply 1 Application topically 2 (two) times daily.   escitalopram (LEXAPRO) 5 MG tablet TAKE 1 TABLET(5 MG) BY MOUTH DAILY   gabapentin (NEURONTIN) 100 MG capsule Take 100 mg by mouth 3 (three) times daily.   hydrOXYzine (ATARAX) 50 MG tablet Take 1 tablet (50 mg total) by mouth at bedtime.   LINZESS 145 MCG CAPS capsule  TAKE 1 CAPSULE(145 MCG) BY MOUTH DAILY   Magnesium 400 MG TABS Take by mouth 2 (two) times daily.   omeprazole (PRILOSEC) 40 MG capsule Take 1 capsule (40 mg total) by mouth daily.   ondansetron (ZOFRAN ODT) 8 MG disintegrating tablet Take 1 tablet (8 mg total) by mouth every 8 (eight) hours as needed for nausea or vomiting.   traZODone (DESYREL) 150 MG tablet Take 1 tablet (150 mg total) by mouth at bedtime. (Patient taking differently: Take 150 mg by mouth at bedtime. Pt taking 200mg )   triamcinolone (KENALOG) 0.025 % cream Apply 1 Application topically 3 (three) times daily as needed.   [DISCONTINUED] clonazePAM (KLONOPIN) 0.5 MG tablet Take 0.5-1 tablets (0.25-0.5 mg total) by mouth daily as needed for anxiety.    OBJECTIVE    BP 115/75 (BP Location: Right Arm, Patient Position: Sitting, Cuff Size: Small)   Pulse 74   Resp 18   Ht 5\' 7"  (1.702 m)   Wt 124 lb 4 oz (56.4 kg)   SpO2 96%   BMI 19.46 kg/m   Physical Exam Vitals and nursing note reviewed.  Constitutional:      General: She is not in acute distress.    Appearance: Normal appearance.  HENT:     Head: Normocephalic and atraumatic.     Right Ear: External ear normal.  Left Ear: External ear normal.     Nose: Nose normal.  Eyes:     Conjunctiva/sclera: Conjunctivae normal.  Cardiovascular:     Rate and Rhythm: Normal rate and regular rhythm.  Pulmonary:     Effort: Pulmonary effort is normal.     Breath sounds: Normal breath sounds.  Neurological:     General: No focal deficit present.     Mental Status: She is alert and oriented to person, place, and time.  Psychiatric:        Mood and Affect: Mood normal.        Behavior: Behavior normal.        Thought Content: Thought content normal.        Judgment: Judgment normal.          ASSESSMENT & PLAN    Problem List Items Addressed This Visit       Other   INSOMNIA, CHRONIC - Primary    Patient presents with concerns of insomnia.  Have gone ahead  and done hydroxyzine 50 mg to see if this helps.  Did discuss side effects with patient that include drowsiness.  Patient is aware.      Relevant Medications   hydrOXYzine (ATARAX) 50 MG tablet   Generalized anxiety disorder    Did do a refill of Klonopin.  Patient seldomly uses this medicine and has not had a refill in 6 months.  Will go ahead and refill today.      Relevant Medications   hydrOXYzine (ATARAX) 50 MG tablet   clonazePAM (KLONOPIN) 0.5 MG tablet    Return in about 6 months (around 05/31/2023).      Meds ordered this encounter  Medications   DISCONTD: clonazePAM (KLONOPIN) 0.5 MG tablet    Sig: Take 0.5-1 tablets (0.25-0.5 mg total) by mouth daily as needed for anxiety.    Dispense:  20 tablet    Refill:  0   hydrOXYzine (ATARAX) 50 MG tablet    Sig: Take 1 tablet (50 mg total) by mouth at bedtime.    Dispense:  30 tablet    Refill:  3   clonazePAM (KLONOPIN) 0.5 MG tablet    Sig: Take 0.5-1 tablets (0.25-0.5 mg total) by mouth daily as needed for anxiety.    Dispense:  20 tablet    Refill:  0    No orders of the defined types were placed in this encounter.    Charlton Amor, DO  [facility] Primary Care & Sports Medicine at [facility] [phone] (phone) [phone] (fax)  [facility]

## 2022-11-28 NOTE — Assessment & Plan Note (Signed)
Patient presents with concerns of insomnia.  Have gone ahead and done hydroxyzine 50 mg to see if this helps.  Did discuss side effects with patient that include drowsiness.  Patient is aware.

## 2022-12-19 ENCOUNTER — Ambulatory Visit: Payer: 59 | Admitting: Internal Medicine

## 2022-12-22 ENCOUNTER — Other Ambulatory Visit: Payer: Self-pay | Admitting: Family Medicine

## 2022-12-22 DIAGNOSIS — F411 Generalized anxiety disorder: Secondary | ICD-10-CM

## 2023-01-09 ENCOUNTER — Other Ambulatory Visit: Payer: Self-pay | Admitting: Family Medicine

## 2023-01-22 ENCOUNTER — Other Ambulatory Visit: Payer: Self-pay | Admitting: Sports Medicine

## 2023-01-24 NOTE — Telephone Encounter (Signed)
to PCP

## 2023-01-27 ENCOUNTER — Other Ambulatory Visit: Payer: Self-pay | Admitting: Pulmonary Disease

## 2023-01-29 ENCOUNTER — Encounter: Payer: Self-pay | Admitting: Family Medicine

## 2023-01-29 ENCOUNTER — Ambulatory Visit (INDEPENDENT_AMBULATORY_CARE_PROVIDER_SITE_OTHER): Payer: 59 | Admitting: Family Medicine

## 2023-01-29 VITALS — BP 122/55 | HR 72 | Ht 67.0 in | Wt 127.9 lb

## 2023-01-29 DIAGNOSIS — F411 Generalized anxiety disorder: Secondary | ICD-10-CM

## 2023-01-29 DIAGNOSIS — Z23 Encounter for immunization: Secondary | ICD-10-CM | POA: Diagnosis not present

## 2023-01-29 DIAGNOSIS — F5101 Primary insomnia: Secondary | ICD-10-CM | POA: Insufficient documentation

## 2023-01-29 MED ORDER — ZOLPIDEM TARTRATE 5 MG PO TABS
5.0000 mg | ORAL_TABLET | Freq: Every evening | ORAL | 1 refills | Status: DC | PRN
Start: 2023-01-29 — End: 2023-03-25

## 2023-01-29 MED ORDER — CLONAZEPAM 0.5 MG PO TABS
0.2500 mg | ORAL_TABLET | Freq: Every day | ORAL | 1 refills | Status: DC | PRN
Start: 2023-01-29 — End: 2023-03-25
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
## 2023-01-29 NOTE — Assessment & Plan Note (Addendum)
Trazodone 150mg  not working for patient. She was on ambien in the past and it worked well for her we will go ahead and do [place] so she can get some sleep.  - did discuss side effects and pt is aware - pmp reviewed and verified with no red flags

## 2023-01-31 ENCOUNTER — Ambulatory Visit: Payer: 59 | Admitting: Internal Medicine

## 2023-02-23 ENCOUNTER — Other Ambulatory Visit: Payer: Self-pay | Admitting: Family Medicine

## 2023-02-23 DIAGNOSIS — G4700 Insomnia, unspecified: Secondary | ICD-10-CM

## 2023-02-26 ENCOUNTER — Ambulatory Visit: Payer: 59 | Admitting: Family Medicine

## 2023-03-01 ENCOUNTER — Encounter: Payer: Self-pay | Admitting: Medical-Surgical

## 2023-03-01 ENCOUNTER — Ambulatory Visit (INDEPENDENT_AMBULATORY_CARE_PROVIDER_SITE_OTHER): Payer: 59 | Admitting: Medical-Surgical

## 2023-03-01 ENCOUNTER — Ambulatory Visit: Payer: 59

## 2023-03-01 VITALS — BP 125/72 | HR 72 | Resp 20 | Ht 67.0 in | Wt 128.1 lb

## 2023-03-01 DIAGNOSIS — S0990XA Unspecified injury of head, initial encounter: Secondary | ICD-10-CM

## 2023-03-01 DIAGNOSIS — W19XXXA Unspecified fall, initial encounter: Secondary | ICD-10-CM | POA: Diagnosis not present

## 2023-03-01 DIAGNOSIS — W010XXA Fall on same level from slipping, tripping and stumbling without subsequent striking against object, initial encounter: Secondary | ICD-10-CM | POA: Diagnosis not present

## 2023-03-01 DIAGNOSIS — Y92009 Unspecified place in unspecified non-institutional (private) residence as the place of occurrence of the external cause: Secondary | ICD-10-CM

## 2023-03-01 MED ORDER — DEXAMETHASONE SODIUM PHOSPHATE 10 MG/ML IJ SOLN
10.0000 mg | Freq: Once | INTRAMUSCULAR | Status: AC
Start: 2023-03-01 — End: 2023-03-01
  Administered 2023-03-01: 10 mg via INTRAMUSCULAR

## 2023-03-01 MED ORDER — KETOROLAC TROMETHAMINE 60 MG/2ML IM SOLN
60.0000 mg | Freq: Once | INTRAMUSCULAR | Status: AC
Start: 2023-03-01 — End: 2023-03-01
  Administered 2023-03-01: 60 mg via INTRAMUSCULAR

## 2023-03-01 NOTE — Progress Notes (Signed)
        Established patient visit  History, exam, impression, and plan:  1. Traumatic injury of head, initial encounter 2. Fall in home, initial encounter Pleasant 62 year old female presenting today with reports of a fall at home approximately 5 days ago.  She was mopping her floor on Sunday and shortly after, she forgot that the floor was still wet.  She slipped and fell while crossing the room.  Notes that her body went up in the air and she landed on the posterior skull.  Thinks she may have lost consciousness for few seconds.  Since then, she has had a posterior headache as well as some worsened neck pain.  She had some nausea the day after the incident.  Has noticed her left eye has been twitching since she fell.  Also had some dizziness the day after her fall but this has resolved.  Has not been taking anything for her headache at home as she reports not liking to take medications.  Overall exam reassuring today with no significant neurological deficiencies.  She is alert and oriented x 3, able to answer questions without difficulty.  Speech is clear with no trouble finding words appropriately.  Ambulating well with a steady gait.  Plan for CT of the head and cervical spine for further evaluation.  Given her persistent headache, we will treat her with Toradol 60 mg and Decadron 10 mg IM in office today. - CT CERVICAL SPINE WO CONTRAST; Future - CT HEAD WO CONTRAST ( ); Future - dexamethasone (DECADRON) injection 10 mg - ketorolac (TORADOL) injection 60 mg  Procedures performed this visit: None.  Return if symptoms worsen or fail to improve.  __________________________________ Thayer Ohm, DNP, APRN, FNP-BC Primary Care and Sports Medicine [facility] [place]

## 2023-03-23 ENCOUNTER — Other Ambulatory Visit: Payer: Self-pay | Admitting: Pulmonary Disease

## 2023-03-25 ENCOUNTER — Encounter: Payer: Self-pay | Admitting: Family Medicine

## 2023-03-25 ENCOUNTER — Ambulatory Visit (INDEPENDENT_AMBULATORY_CARE_PROVIDER_SITE_OTHER): Payer: 59 | Admitting: Family Medicine

## 2023-03-25 VITALS — BP 106/70 | HR 74 | Ht 67.0 in | Wt 133.8 lb

## 2023-03-25 DIAGNOSIS — F411 Generalized anxiety disorder: Secondary | ICD-10-CM

## 2023-03-25 DIAGNOSIS — G4700 Insomnia, unspecified: Secondary | ICD-10-CM | POA: Diagnosis not present

## 2023-03-25 MED ORDER — ESCITALOPRAM OXALATE 5 MG PO TABS: 5.0000 mg | ORAL_TABLET | Freq: Every day | ORAL | 2 refills | Status: DC

## 2023-03-25 MED ORDER — CLONAZEPAM 0.5 MG PO TABS: 0.2500 mg | ORAL_TABLET | Freq: Two times a day (BID) | ORAL | 0 refills | Status: DC | PRN

## 2023-03-25 MED ORDER — ZOLPIDEM TARTRATE 10 MG PO TABS: 10.0000 mg | ORAL_TABLET | Freq: Every evening | ORAL | 3 refills | Status: DC | PRN
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
## 2023-03-25 NOTE — Assessment & Plan Note (Addendum)
Pt's anxiety is exacerbated by daughter's injury and stubbornness to not quit drinking. Ms. Erin [name] has been cleaning her house and taking care of her grandson which has only worsened her anxiety. She is continuing to talk to her therapist and it is helping. Will continue clonipin. We did discuss tapering and she said in a few months once this winter calms down we will. Pmp reviewed and verified with no red flags - have also refilled lexapro 5mg 

## 2023-03-25 NOTE — Assessment & Plan Note (Signed)
Will go ahead and increase to [place] 10mg . Pmp reviewed and with no red flags. Did discuss side effects again and pt is aware. She said she had worse side effects with the trazodone of drowsiness so we have discontinued this. Pmp reviewed with no red flags.

## 2023-04-15 ENCOUNTER — Encounter: Payer: Self-pay | Admitting: Internal Medicine

## 2023-04-15 ENCOUNTER — Ambulatory Visit: Payer: 59 | Admitting: Internal Medicine

## 2023-04-15 VITALS — BP 122/70 | HR 69 | Ht 67.0 in | Wt 129.2 lb

## 2023-04-15 DIAGNOSIS — F17210 Nicotine dependence, cigarettes, uncomplicated: Secondary | ICD-10-CM

## 2023-04-15 DIAGNOSIS — Z23 Encounter for immunization: Secondary | ICD-10-CM | POA: Diagnosis not present

## 2023-04-15 DIAGNOSIS — Z720 Tobacco use: Secondary | ICD-10-CM | POA: Diagnosis not present

## 2023-04-15 DIAGNOSIS — J439 Emphysema, unspecified: Secondary | ICD-10-CM | POA: Diagnosis not present

## 2023-04-15 DIAGNOSIS — J4489 Other specified chronic obstructive pulmonary disease: Secondary | ICD-10-CM | POA: Diagnosis not present

## 2023-04-15 MED ORDER — BREZTRI AEROSPHERE 160-9-4.8 MCG/ACT IN AERO: 2.0000 | INHALATION_SPRAY | Freq: Two times a day (BID) | RESPIRATORY_TRACT | 0 refills | Status: DC

## 2023-04-15 MED ORDER — ALBUTEROL SULFATE HFA 108 (90 BASE) MCG/ACT IN AERS: 2.0000 | INHALATION_SPRAY | RESPIRATORY_TRACT | 2 refills | Status: DC | PRN

## 2023-04-15 MED ORDER — ALBUTEROL SULFATE (2.5 MG/3ML) 0.083% IN NEBU: 2.5000 mg | INHALATION_SOLUTION | Freq: Four times a day (QID) | RESPIRATORY_TRACT | 12 refills | Status: AC | PRN

## 2023-04-15 NOTE — Progress Notes (Signed)
Erin [name]    [id number]    1961/01/28  Primary Care Physician:Wachs, Colbert Coyer, DO Date of Appointment: 04/15/2023 Established Patient Visit  Chief complaint:   Chief Complaint  Patient presents with   Consult    SOB/coughing during exertion. Current smoker. Using albuterol 3-4x daily      HPI: Erin [name] is a 62 y.o. woman with pats medical history of severe COPD FEV 47% of predicted, ongoing tobacco use disorder, cholangiocarcinoma s/p partial liver resection  Interval Updates: Previous patient of Dr. Everardo All, here to establish with me.   Current therapy is Breztri 2 puffs twice daily. Takes albuterol 3-4 times/day. She doesn't have a nebulizer machine at home.   Having dyspnea with ADLs, doesn't feel like she has the stamina she had 6 months ago. She denies frequent exacerbations requiring steroids and antitbiotics. Symptoms accompanied with wheezing and coughing, occasional mucus production.   Had LDCT for lung cancer screening Sept 2024 - no suspicious nodules/masses.   She is pre-contemplative in quitting smoking. Lives on her own.  Current smoking 1/2 ppd. Usually otherwise 1 ppd. 50 x 1 ppd = 50 pack years.   I have reviewed the patient's family social and past medical history and updated as appropriate.   Past Medical History:  Diagnosis Date   Allergy    Anxiety    Arthritis    hands and feet   ASCUS (atypical squamous cells of undetermined significance) on Pap smear    Blood transfusion 1983   In [place]   BRCA2 gene mutation positive 2014   Bursitis    Cancer (HCC)    liver   Chronic constipation    Condyloma    COPD (chronic obstructive pulmonary disease) (HCC)    Depression    per pt h/o depression- no meds currently. Dx of bi-polar depression in EPIC   Emphysema of lung (HCC)    GERD (gastroesophageal reflux disease)    Headache(784.0)    migraines   Hot flashes, menopausal    HPV (human papilloma virus) infection    Lung  nodule, multiple    Osteopenia    Osteoporosis 01/22/2018   Seizures (HCC)    unknown reason for seizures- last seizurein Sept 2012   Stroke [facility])    TIA (transient ischemic attack)     Past Surgical History:  Procedure Laterality Date   ABDOMINAL HYSTERECTOMY     ABDOMINOPLASTY     BREAST SURGERY     CHOLECYSTECTOMY     HERNIA REPAIR     LAPAROSCOPY  01/04/2011   Procedure: LAPAROSCOPY OPERATIVE;  Surgeon: Hollie Salk C. Marice Potter, MD;  Location: [facility];  Service: Gynecology;  Laterality: N/A;   LAPAROSCOPY  09/14/2012   gall bladder and 1/2 liver   SALPINGOOPHORECTOMY  01/04/2011   Procedure: SALPINGO OOPHERECTOMY;  Surgeon: Myra C. Marice Potter, MD;  Location: [facility];  Service: Gynecology;  Laterality: Bilateral;   TOTAL VAGINAL HYSTERECTOMY  62 yrs old    Family History  Problem Relation Age of Onset   Breast cancer Mother        postmenopausal   Depression Mother    Drug abuse Mother    Drug abuse Father    Lung cancer Father    Alcohol abuse Daughter    Breast cancer Daughter    Drug abuse Sister    Breast cancer Maternal Aunt    Ovarian cancer Neg Hx    Uterine cancer Neg Hx  Social History   Occupational History   Occupation: Disabled.  Tobacco Use   Smoking status: Every Day    Current packs/day: 0.50    Average packs/day: 1 pack/day for 41.0 years (40.5 ttl pk-yrs)    Types: Cigarettes    Start date: 2024   Smokeless tobacco: Never   Tobacco comments:    0.5 pack a day  Vaping Use   Vaping status: Never Used  Substance and Sexual Activity   Alcohol use: No   Drug use: Not Currently    Types: Cocaine, Marijuana    Comment: history of use   Sexual activity: Not Currently    Partners: Male     Physical Exam: Blood pressure 122/70, pulse 69, height 5\' 7"  (1.702 m), weight 129 lb 3.2 oz (58.6 kg), SpO2 97%.  Gen:      No acute distress Lungs:    Diminished, clear, no wheeze CV:         Regular rate and rhythm; no murmurs, rubs, or gallops.  No pedal  edema   Data Reviewed: Imaging: I have personally reviewed the CT Chest June 2023 - no concerning nodules or masses with centrilobular emphysema  PFTs:     Latest Ref Rng & Units 02/08/2021    8:35 AM  PFT Results  FVC-Pre L 2.75   FVC-Predicted Pre % 74   FVC-Post L 3.02   FVC-Predicted Post % 81   Pre FEV1/FVC % % 50   Post FEV1/FCV % % 53   FEV1-Pre L 1.37   FEV1-Predicted Pre % 47   FEV1-Post L 1.59   DLCO uncorrected ml/min/mmHg 12.49   DLCO UNC% % 55   DLCO corrected ml/min/mmHg 12.42   DLCO COR %Predicted % 55   DLVA Predicted % 53    I have personally reviewed the patient's PFTs and severe airlfow limitation  Labs: Lab Results  Component Value Date   WBC 5.3 07/31/2021   HGB 13.4 07/31/2021   HCT 39.9 07/31/2021   MCV 96.1 07/31/2021   PLT 285 07/31/2021   Lab Results  Component Value Date   NA 147 (H) 05/28/2022   K 4.4 05/28/2022   CL 107 (H) 05/28/2022   CO2 22 05/28/2022    Immunization status: Immunization History  Administered Date(s) Administered   Influenza, Seasonal, Injecte, Preservative Fre 01/29/2023   Influenza,inj,Quad PF,6+ Mos 02/03/2019, 01/01/2020, 12/20/2021   PFIZER(Purple Top)SARS-COV-2 Vaccination 07/16/2019, 08/10/2019, 01/16/2020   Pneumococcal Polysaccharide-23 02/03/2015   Tdap 05/08/2010, 07/31/2021   Zoster Recombinant(Shingrix) 07/31/2021, 12/20/2021   Zoster, Live 01/04/2014    External Records Personally Reviewed: pulmonary  Assessment:  Severe COPD FEV1 47% of predicted with progression of symptoms  Ongoing tobacco use disorder, pre-contemplative in tobacco cessation Stage I cholangiocarcinoma s/p liver resection in remission  Plan/Recommendations:  We will give you the pneumonia vaccine today.  PCV 20  Continue the breztri 2 puffs twice daily, gargle after use.   Take the albuterol rescue inhaler or nebulizer every 4 to 6 hours as needed for wheezing or shortness of breath. You can also take it 15  minutes before exercise or exertional activity. Side effects include heart racing or pounding, jitters or anxiety. If you have a history of an irregular heart rhythm, it can make this worse. Can also give some patients a hard time sleeping.  I am sending the nebulizer treatments to your pharmacy. The machine will be delivered to your home.   You are next due for a CT scan in September  2025 for lung cancer screening.   Call me sooner if your breathing is worsening, or if you want to talk more about quitting smoking.   September 2025 for LDCT for lung cancer screening - managed by oncologist.    Return to Care: Return in about 6 months (around 10/14/2023).   Durel Salts, MD Pulmonary and Critical Care Medicine [facility] Office:[phone]  Smoking Cessation Counseling:  1. The patient is an everyday smoker and symptomatic due to the following condition COPD 2. The patient is currently pre-contemplative in quitting smoking. 3. I advised patient to quit smoking. 4. We identified patient specific barriers to change.  5. I personally spent 3  minutes counseling the patient regarding tobacco use disorder. 6. We discussed management of stress and anxiety to help with smoking cessation, when applicable. 7. We discussed nicotine replacement therapy, Wellbutrin, Chantix as possible options. 8. I advised setting a quit date. 9. Follow?up arranged with our office to continue ongoing discussions. 10.Resources given to patient including quit hotline.

## 2023-04-15 NOTE — Patient Instructions (Addendum)
It was a pleasure to see you today!  Please schedule follow up scheduled with myself in 6 months.  If my schedule is not open yet, we will contact you with a reminder closer to that time. Please call [phone] if you haven't heard from [place] a month before, and always call us sooner if issues or concerns arise. You can also send [place] a message through MyChart, but but aware that this is not to be used for urgent issues and it may take up to 5-7 days to receive a reply. Please be aware that you will likely be able to view your results before I have a chance to respond to them. Please give [place] 5 business days to respond to any non-urgent results.    We will give you the pneumonia vaccine today.  Continue the breztri 2 puffs twice daily, gargle after use.   Take the albuterol rescue inhaler or nebulizer every 4 to 6 hours as needed for wheezing or shortness of breath. You can also take it 15 minutes before exercise or exertional activity. Side effects include heart racing or pounding, jitters or anxiety. If you have a history of an irregular heart rhythm, it can make this worse. Can also give some patients a hard time sleeping.  I am sending the nebulizer treatments to your pharmacy. The machine will be delivered to your home.   You are next due for a CT scan in September 2025 for lung cancer screening.   Call me sooner if your breathing is worsening, or if you want to talk more about quitting smoking.   What are the benefits of quitting smoking? Quitting smoking can lower your chances of getting or dying from heart disease, lung disease, kidney failure, infection, or cancer. It can also lower your chances of getting osteoporosis, a condition that makes your bones weak. Plus, quitting smoking can help your skin look younger and reduce the chances that you will have problems with sex.  Quitting smoking will improve your health no matter how old you are, and no matter how long or how much you have  smoked.  What should I do if I want to quit smoking? The letters in the word "START" can help you remember the steps to take: S = Set a quit date. T = Tell family, friends, and the people around you that you plan to quit. A = Anticipate or plan ahead for the tough times you'll face while quitting. R = Remove cigarettes and other tobacco products from your home, car, and work. T = Talk to your doctor about getting help to quit.  How can my doctor or nurse help? Your doctor or nurse can give you advice on the best way to quit. He or she can also put you in touch with counselors or other people you can call for support. Plus, your doctor or nurse can give you medicines to: ?Reduce your craving for cigarettes ?Reduce the unpleasant symptoms that happen when you stop smoking (called "withdrawal symptoms"). You can also get help from a free phone line (1-800-QUIT-NOW) or go online to [employer].  What are the symptoms of withdrawal? The symptoms include: ?Trouble sleeping ?Being irritable, anxious or restless ?Getting frustrated or angry ?Having trouble thinking clearly  Some people who stop smoking become temporarily depressed. Some people need treatment for depression, such as counseling or antidepressant medicines. Depressed people might: ?No longer enjoy or care about doing the things they used to like to do ?Feel sad,  down, hopeless, nervous, or cranky most of the day, almost every day ?Lose or gain weight ?Sleep too much or too little ?Feel tired or like they have no energy ?Feel guilty or like they are worth nothing ?Forget things or feel confused ?Move and speak more slowly than usual ?Act restless or have trouble staying still ?Think about death or suicide  If you think you might be depressed, see your doctor or nurse. Only someone trained in mental health can tell for sure if you are depressed. If you ever feel like you might hurt yourself, go straight to the nearest  emergency department. Or you can call for an ambulance (in the [place] and [place], dial 9-1-1) or call your doctor or nurse right away and tell them it is an emergency. You can also reach the [place] National Suicide Prevention Lifeline at 207 100 2079 or http://hill.com/.  How do medicines help you stop smoking? Different medicines work in different ways: ?Nicotine replacement therapy eases withdrawal and reduces your body's craving for nicotine, the main drug found in cigarettes. There are different forms of nicotine replacement, including skin patches, lozenges, gum, nasal sprays, and "puffers" or inhalers. Many can be bought without a prescription, while others might require one. ?Bupropion is a prescription medicine that reduces your desire to smoke. This medicine is sold under the brand names Zyban and Wellbutrin. It is also available in a generic version, which is cheaper than brand name medicines. ?Varenicline (brand names: Chantix, Champix) is a prescription medicine that reduces withdrawal symptoms and cigarette cravings. If you think you'd like to take varenicline and you have a history of depression, anxiety, or heart disease, discuss this with your doctor or nurse before taking the medicine. Varenicline can also increase the effects of alcohol in some people. It's a good idea to limit drinking while you're taking it, at least until you know how it affects you.  How does counseling work? Counseling can happen during formal office visits or just over the phone. A counselor can help you: ?Figure out what triggers your smoking and what to do instead ?Overcome cravings ?Figure out what went wrong when you tried to quit before  What works best? Studies show that people have the best luck at quitting if they take medicines to help them quit and work with a Veterinary surgeon. It might also be helpful to combine nicotine replacement with one of the prescription medicines that help people quit. In  some cases, it might even make sense to take bupropion and varenicline together.  What about e-cigarettes? Sometimes people wonder if using electronic cigarettes, or "e-cigarettes," might help them quit smoking. Using e-cigarettes is also called "vaping." Doctors do not recommend e-cigarettes in place of medicines and counseling. That's because e-cigarettes still contain nicotine as well as other substances that might be harmful. It's not clear how they can affect a person's health in the long term.  Will I gain weight if I quit? Yes, you might gain a few pounds. But quitting smoking will have a much more positive effect on your health than weighing a few pounds more. Plus, you can help prevent some weight gain by being more active and eating less. Taking the medicine bupropion might help control weight gain.   What else can I do to improve my chances of quitting? You can: ?Start exercising. ?Stay away from smokers and places that you associate with smoking. If people close to you smoke, ask them to quit with you. ?Keep gum, hard candy, or something  to put in your mouth handy. If you get a craving for a cigarette, try one of these instead. ?Don't give up, even if you start smoking again. It takes most people a few tries before they succeed.  What if I am pregnant and I smoke? If you are pregnant, it's really important for the health of your baby that you quit. Ask your doctor what options you have, and what is safest for your baby

## 2023-04-15 NOTE — Addendum Note (Signed)
Addended by: Christen Butter on: 04/15/2023 11:43 AM   Modules accepted: Orders

## 2023-04-18 DIAGNOSIS — J4489 Other specified chronic obstructive pulmonary disease: Secondary | ICD-10-CM | POA: Diagnosis not present

## 2023-04-23 ENCOUNTER — Ambulatory Visit (INDEPENDENT_AMBULATORY_CARE_PROVIDER_SITE_OTHER): Payer: 59 | Admitting: Family Medicine

## 2023-04-23 VITALS — BP 127/73 | HR 75 | Ht 67.0 in | Wt 129.2 lb

## 2023-04-23 DIAGNOSIS — M47816 Spondylosis without myelopathy or radiculopathy, lumbar region: Secondary | ICD-10-CM

## 2023-04-23 DIAGNOSIS — M5441 Lumbago with sciatica, right side: Secondary | ICD-10-CM | POA: Diagnosis not present

## 2023-04-23 DIAGNOSIS — M5442 Lumbago with sciatica, left side: Secondary | ICD-10-CM | POA: Diagnosis not present

## 2023-04-23 MED ORDER — METHYLPREDNISOLONE 4 MG PO TBPK: ORAL_TABLET | ORAL | 0 refills | Status: DC

## 2023-04-23 MED ORDER — CYCLOBENZAPRINE HCL 5 MG PO TABS: 5.0000 mg | ORAL_TABLET | Freq: Two times a day (BID) | ORAL | 1 refills | Status: DC | PRN

## 2023-04-23 MED ORDER — METHYLPREDNISOLONE SODIUM SUCC 125 MG IJ SOLR
125.0000 mg | Freq: Once | INTRAMUSCULAR | Status: AC
  Administered 2023-04-23: 125 mg via INTRAMUSCULAR

## 2023-04-23 MED ORDER — METHYLPREDNISOLONE SODIUM SUCC 125 MG IJ SOLR: 125.0000 mg | Freq: Once | INTRAMUSCULAR | Status: DC

## 2023-04-23 NOTE — Progress Notes (Signed)
 Acute Office Visit  Subjective:     Patient ID: Erin [name], female    DOB: Jul 07, 1960, 63 y.o.   MRN: [id number]  Chief Complaint  Patient presents with   Back Pain    X2 days states she may have done it vacuuming     HPI Patient is in today for back pain that started suddenly and is painful to the point where she can't move.   Review of Systems  Constitutional:  Negative for chills and fever.  Respiratory:  Negative for cough and shortness of breath.   Cardiovascular:  Negative for chest pain.  Musculoskeletal:  Positive for back pain.  Neurological:  Negative for headaches.        Objective:    BP 127/73 (BP Location: Left Arm, Patient Position: Sitting, Cuff Size: Normal)   Pulse 75   Ht 5' 7 (1.702 m)   Wt 129 lb 3.2 oz (58.6 kg)   SpO2 99%   BMI 20.24 kg/m    Physical Exam Vitals and nursing note reviewed.  Constitutional:      General: She is not in acute distress.    Appearance: Normal appearance.  HENT:     Head: Normocephalic and atraumatic.     Right Ear: External ear normal.     Left Ear: External ear normal.     Nose: Nose normal.  Eyes:     Conjunctiva/sclera: Conjunctivae normal.  Cardiovascular:     Rate and Rhythm: Normal rate and regular rhythm.  Pulmonary:     Effort: Pulmonary effort is normal.     Breath sounds: Normal breath sounds.  Neurological:     General: No focal deficit present.     Mental Status: She is alert and oriented to person, place, and time.  Psychiatric:        Mood and Affect: Mood normal.        Behavior: Behavior normal.        Thought Content: Thought content normal.        Judgment: Judgment normal.     No results found for any visits on 04/23/23.      Assessment & Plan:   Problem List Items Addressed This Visit       Nervous and Auditory   Acute low back pain with bilateral sciatica   Pt was supposed to see Dr. ONEIDA today and it was erroneously scheduled with me pt has acutely thrown out her  back. Has a hx of lumbar spondylosis which sounds like is exacerbated at this point. Dr. ONEIDA was able to place order for epidural and I will manage with solumedrol 125mg  IM in clinic today along with flexiril BID and medrol  dose pack starting tomorrow. She will get a phone call about epidural and she is aware. Gave red flag precaution for cauda equina symptoms      Relevant Medications   methylPREDNISolone  sodium succinate (SOLU-MEDROL ) 125 mg/2 mL injection 125 mg   methylPREDNISolone  (MEDROL  DOSEPAK) 4 MG TBPK tablet   cyclobenzaprine  (FLEXERIL ) 5 MG tablet     Musculoskeletal and Integument   Lumbar spondylosis - Primary   Caiya has had some epidurals in the past, she does relatively well, she has wanted to switch to Dr. Rosella here in [place], we did the order sometime ago and it was never followed through with, repeat referral to Dr. Rosella for L4-L5 interlaminar epidural      Relevant Medications   methylPREDNISolone  sodium succinate (SOLU-MEDROL ) 125 mg/2 mL injection 125 mg  methylPREDNISolone  (MEDROL  DOSEPAK) 4 MG TBPK tablet   cyclobenzaprine  (FLEXERIL ) 5 MG tablet   Other Relevant Orders   Ambulatory referral to Pain Clinic    Meds ordered this encounter  Medications   methylPREDNISolone  sodium succinate (SOLU-MEDROL ) 125 mg/2 mL injection 125 mg   methylPREDNISolone  (MEDROL  DOSEPAK) 4 MG TBPK tablet    Sig: Follow instructions on pill pack    Dispense:  21 tablet    Refill:  0   cyclobenzaprine  (FLEXERIL ) 5 MG tablet    Sig: Take 1 tablet (5 mg total) by mouth 2 (two) times daily as needed for muscle spasms.    Dispense:  30 tablet    Refill:  1    No follow-ups on file.  Bernice GORMAN Juneau, DO

## 2023-04-23 NOTE — Assessment & Plan Note (Signed)
 Erin [name] has had some epidurals in the past, she does relatively well, she has wanted to switch to Dr. Rosella here in [place], we did the order sometime ago and it was never followed through with, repeat referral to Dr. Rosella for L4-L5 interlaminar epidural

## 2023-04-23 NOTE — Assessment & Plan Note (Signed)
 Pt was supposed to see Dr. ONEIDA today and it was erroneously scheduled with me pt has acutely thrown out her back. Has a hx of lumbar spondylosis which sounds like is exacerbated at this point. Dr. ONEIDA was able to place order for epidural and I will manage with solumedrol 125mg  IM in clinic today along with flexiril BID and medrol  dose pack starting tomorrow. She will get a phone call about epidural and she is aware. Gave red flag precaution for cauda equina symptoms

## 2023-04-26 DIAGNOSIS — M5416 Radiculopathy, lumbar region: Secondary | ICD-10-CM | POA: Diagnosis not present

## 2023-04-26 DIAGNOSIS — M47816 Spondylosis without myelopathy or radiculopathy, lumbar region: Secondary | ICD-10-CM | POA: Diagnosis not present

## 2023-05-23 ENCOUNTER — Other Ambulatory Visit: Payer: Self-pay | Admitting: Family Medicine

## 2023-05-23 ENCOUNTER — Telehealth: Payer: Self-pay

## 2023-05-23 DIAGNOSIS — I739 Peripheral vascular disease, unspecified: Secondary | ICD-10-CM

## 2023-05-23 NOTE — Telephone Encounter (Signed)
 Copied from CRM [id number]. Topic: Clinical - Medical Advice >> May 22, 2023  3:58 PM Joesph PARAS wrote: Reason for CRM: Estela with [facility] is calling to inform PCP and practice that she has done a PAD test on the patient today. Right leg is 0.85 which is a massive circulation issues. Left is 0.51 which is a moderate circulation issue.

## 2023-05-23 NOTE — Telephone Encounter (Signed)
 Called and left a detailed voice mail message on patient home #  ( allowed on DPR ) =[phone]

## 2023-05-25 ENCOUNTER — Other Ambulatory Visit: Payer: Self-pay | Admitting: Pulmonary Disease

## 2023-05-28 ENCOUNTER — Ambulatory Visit (INDEPENDENT_AMBULATORY_CARE_PROVIDER_SITE_OTHER): Payer: 59 | Admitting: Family Medicine

## 2023-05-28 ENCOUNTER — Encounter: Payer: Self-pay | Admitting: Family Medicine

## 2023-05-28 VITALS — BP 115/75 | HR 63 | Ht 67.0 in | Wt 137.5 lb

## 2023-05-28 DIAGNOSIS — G4700 Insomnia, unspecified: Secondary | ICD-10-CM

## 2023-05-28 DIAGNOSIS — F411 Generalized anxiety disorder: Secondary | ICD-10-CM | POA: Diagnosis not present

## 2023-05-28 MED ORDER — CLONAZEPAM 0.5 MG PO TABS: 0.2500 mg | ORAL_TABLET | Freq: Two times a day (BID) | ORAL | 0 refills | Status: DC | PRN

## 2023-05-28 MED ORDER — ZOLPIDEM TARTRATE 10 MG PO TABS: 10.0000 mg | ORAL_TABLET | Freq: Every evening | ORAL | 3 refills | Status: DC | PRN

## 2023-05-28 MED ORDER — ESCITALOPRAM OXALATE 5 MG PO TABS: 5.0000 mg | ORAL_TABLET | Freq: Every day | ORAL | 2 refills | Status: DC

## 2023-05-28 NOTE — Progress Notes (Signed)
Established patient visit   Patient: Erin [name]   DOB: 07/30/1960   63 y.o. Female  MRN: [id number] Visit Date: 05/28/2023  Today's healthcare provider: Charlton Amor, DO   Chief Complaint  Patient presents with   Medical Management of Chronic Issues    Discuss medications     SUBJECTIVE    Chief Complaint  Patient presents with   Medical Management of Chronic Issues    Discuss medications    HPI HPI     Medical Management of Chronic Issues    Additional comments: Discuss medications       Last edited by Roselyn Reef, CMA on 05/28/2023 10:12 AM.       Pt presents for medication refills. Has a pmh of GAD  - needs refills klonopin 0.5mg   - lexapro 5mg    Insomnia - zolpidem 10mg      Review of Systems  Constitutional:  Negative for activity change, fatigue and fever.  Respiratory:  Negative for cough and shortness of breath.   Cardiovascular:  Negative for chest pain.  Gastrointestinal:  Negative for abdominal pain.  Genitourinary:  Negative for difficulty urinating.       Current Meds  Medication Sig   albuterol (PROVENTIL) (2.5 MG/3ML) 0.083% nebulizer solution Take 3 mLs (2.5 mg total) by nebulization every 6 (six) hours as needed for wheezing or shortness of breath.   albuterol (VENTOLIN HFA) 108 (90 Base) MCG/ACT inhaler Inhale 2 puffs into the lungs every 4 (four) hours as needed for wheezing or shortness of breath.   BREZTRI AEROSPHERE 160-9-4.8 MCG/ACT AERO INHALE 2 INHALATIONS BY MOUTH  INTO THE LUNGS TWICE DAILY IN  THE MORNING AND AT BEDTIME   Cholecalciferol (VITAMIN D3) 25 MCG (1000 UT) capsule Take 1 capsule (1,000 Units total) by mouth daily.   clonazePAM (KLONOPIN) 0.5 MG tablet Take 0.5-1 tablets (0.25-0.5 mg total) by mouth 2 (two) times daily as needed for anxiety.   clonazePAM (KLONOPIN) 0.5 MG tablet Take 0.5-1 tablets (0.25-0.5 mg total) by mouth 2 (two) times daily as needed for anxiety.   [START ON 06/25/2023] clonazePAM  (KLONOPIN) 0.5 MG tablet Take 0.5-1 tablets (0.25-0.5 mg total) by mouth 2 (two) times daily as needed for anxiety.   clotrimazole-betamethasone (LOTRISONE) cream Apply 1 Application topically 2 (two) times daily.   cyclobenzaprine (FLEXERIL) 5 MG tablet Take 1 tablet (5 mg total) by mouth 2 (two) times daily as needed for muscle spasms.   dicyclomine (BENTYL) 20 MG tablet Take 20 mg by mouth 2 (two) times daily.   escitalopram (LEXAPRO) 5 MG tablet Take 1 tablet (5 mg total) by mouth daily.   famotidine (PEPCID) 20 MG tablet Take 20 mg by mouth daily.   LINZESS 145 MCG CAPS capsule TAKE 1 CAPSULE(145 MCG) BY MOUTH DAILY   Magnesium 400 MG TABS Take by mouth 2 (two) times daily.   methylPREDNISolone (MEDROL DOSEPAK) 4 MG TBPK tablet Follow instructions on pill pack   omeprazole (PRILOSEC) 40 MG capsule Take 1 capsule (40 mg total) by mouth daily.   ondansetron (ZOFRAN ODT) 8 MG disintegrating tablet Take 1 tablet (8 mg total) by mouth every 8 (eight) hours as needed for nausea or vomiting.   triamcinolone (KENALOG) 0.025 % cream Apply 1 Application topically 3 (three) times daily as needed.   zolpidem (AMBIEN) 10 MG tablet Take 1 tablet (10 mg total) by mouth at bedtime as needed for sleep.    OBJECTIVE    BP 115/75 (BP Location: Left Arm,  Patient Position: Sitting, Cuff Size: Normal)   Pulse 63   Ht 5\' 7"  (1.702 m)   Wt 137 lb 8 oz (62.4 kg)   SpO2 98%   BMI 21.54 kg/m   Physical Exam Vitals and nursing note reviewed.  Constitutional:      General: She is not in acute distress.    Appearance: Normal appearance.  HENT:     Head: Normocephalic and atraumatic.     Right Ear: External ear normal.     Left Ear: External ear normal.     Nose: Nose normal.  Eyes:     Conjunctiva/sclera: Conjunctivae normal.  Cardiovascular:     Rate and Rhythm: Normal rate and regular rhythm.  Pulmonary:     Effort: Pulmonary effort is normal.     Breath sounds: Normal breath sounds.   Neurological:     General: No focal deficit present.     Mental Status: She is alert and oriented to person, place, and time.  Psychiatric:        Mood and Affect: Mood normal.        Behavior: Behavior normal.        Thought Content: Thought content normal.        Judgment: Judgment normal.        ASSESSMENT & PLAN    Problem List Items Addressed This Visit       Other   INSOMNIA, CHRONIC   Have sent in ambien for patient while I am out on maternity leave.      Generalized anxiety disorder - Primary   Pt doing well from anxiety standpoint. Is still worried about her grandson who has autism and is a teenager and getting into trouble.  - she does need refills on klonopin and lexapro. She is not using the klonopin frequently and says she still has some pills left over. I have sent in a supply for her while I am out on maternity leave. Pmp reviewed and verified with no red flags      Relevant Medications   clonazePAM (KLONOPIN) 0.5 MG tablet (Start on 06/25/2023)   escitalopram (LEXAPRO) 5 MG tablet   zolpidem (AMBIEN) 10 MG tablet   RESOLVED: GAD (generalized anxiety disorder)   Relevant Medications   escitalopram (LEXAPRO) 5 MG tablet    Return in about 3 months (around 08/25/2023).      Meds ordered this encounter  Medications   clonazePAM (KLONOPIN) 0.5 MG tablet    Sig: Take 0.5-1 tablets (0.25-0.5 mg total) by mouth 2 (two) times daily as needed for anxiety.    Dispense:  60 tablet    Refill:  0   escitalopram (LEXAPRO) 5 MG tablet    Sig: Take 1 tablet (5 mg total) by mouth daily.    Dispense:  90 tablet    Refill:  2   zolpidem (AMBIEN) 10 MG tablet    Sig: Take 1 tablet (10 mg total) by mouth at bedtime as needed for sleep.    Dispense:  30 tablet    Refill:  3    No orders of the defined types were placed in this encounter.    Charlton Amor, DO  [facility] at [facility] [phone] (phone) [phone]  (fax)  [facility]

## 2023-05-28 NOTE — Assessment & Plan Note (Signed)
Have sent in [place] for patient while I am out on maternity leave.

## 2023-05-28 NOTE — Assessment & Plan Note (Signed)
Pt doing well from anxiety standpoint. Is still worried about her grandson who has autism and is a teenager and getting into trouble.  - she does need refills on klonopin and lexapro. She is not using the klonopin frequently and says she still has some pills left over. I have sent in a supply for her while I am out on maternity leave. Pmp reviewed and verified with no red flags

## 2023-06-06 ENCOUNTER — Ambulatory Visit: Payer: 59 | Admitting: Family Medicine

## 2023-06-08 DIAGNOSIS — M5117 Intervertebral disc disorders with radiculopathy, lumbosacral region: Secondary | ICD-10-CM | POA: Diagnosis not present

## 2023-06-08 DIAGNOSIS — M4726 Other spondylosis with radiculopathy, lumbar region: Secondary | ICD-10-CM | POA: Diagnosis not present

## 2023-06-08 DIAGNOSIS — M5116 Intervertebral disc disorders with radiculopathy, lumbar region: Secondary | ICD-10-CM | POA: Diagnosis not present

## 2023-06-08 DIAGNOSIS — M47816 Spondylosis without myelopathy or radiculopathy, lumbar region: Secondary | ICD-10-CM | POA: Diagnosis not present

## 2023-06-08 DIAGNOSIS — M4727 Other spondylosis with radiculopathy, lumbosacral region: Secondary | ICD-10-CM | POA: Diagnosis not present

## 2023-06-08 DIAGNOSIS — M5416 Radiculopathy, lumbar region: Secondary | ICD-10-CM | POA: Diagnosis not present

## 2023-06-08 DIAGNOSIS — M51369 Other intervertebral disc degeneration, lumbar region without mention of lumbar back pain or lower extremity pain: Secondary | ICD-10-CM | POA: Diagnosis not present

## 2023-06-15 DIAGNOSIS — J4489 Other specified chronic obstructive pulmonary disease: Secondary | ICD-10-CM | POA: Diagnosis not present

## 2023-06-26 ENCOUNTER — Ambulatory Visit: Payer: 59

## 2023-06-26 VITALS — Ht 67.0 in | Wt 136.0 lb

## 2023-06-26 DIAGNOSIS — Z0000 Encounter for general adult medical examination without abnormal findings: Secondary | ICD-10-CM

## 2023-06-26 NOTE — Progress Notes (Signed)
 Subjective:   Erin [name] is a 63 y.o. female who presents for Medicare Annual (Subsequent) preventive examination.  Visit Complete: Virtual I connected with  Erin [name] on 06/26/23 by a audio enabled telemedicine application and verified that I am speaking with the correct person using two identifiers.  Patient Location: Home  Provider Location: Office/Clinic  I discussed the limitations of evaluation and management by telemedicine. The patient expressed understanding and agreed to proceed.  Vital Signs: Because this visit was a virtual/telehealth visit, some criteria may be missing or patient reported. Any vitals not documented were not able to be obtained and vitals that have been documented are patient reported.  Patient Medicare AWV questionnaire was completed by the patient on 06/22/2023; I have confirmed that all information answered by patient is correct and no changes since this date.  Cardiac Risk Factors include: smoking/ tobacco exposure;dyslipidemia;sedentary lifestyle;advanced age (>46men, >74 women)     Objective:    Today's Vitals   06/26/23 0952  Weight: 136 lb (61.7 kg)  Height: 5\' 7"  (1.702 m)  PainSc: 5    Body mass index is 21.3 kg/m.     06/26/2023   10:07 AM 06/25/2022    8:53 AM 09/21/2021   11:31 AM 11/28/2020   10:51 AM 04/29/2020    9:41 AM 03/16/2019    8:55 AM 01/04/2011   11:34 AM  Advanced Directives  Does Patient Have a Medical Advance Directive? No Yes Yes No No No   Type of Advance Directive  Living will;Healthcare Power of Attorney Out of facility DNR (pink MOST or yellow form)      Does patient want to make changes to medical advance directive?  No - Patient declined Yes (Inpatient - patient defers changing a medical advance directive at this time - Information given)      Copy of Healthcare Power of Attorney in Chart?  Yes - validated most recent copy scanned in chart (See row information)       Would patient like information on  creating a medical advance directive? No - Patient declined   No - Patient declined No - Patient declined    Pre-existing out of facility DNR order (yellow form or pink MOST form)   Yellow form placed in chart (order not valid for inpatient use)    No    Current Medications (verified) Outpatient Encounter Medications as of 06/26/2023  Medication Sig   albuterol (PROVENTIL) (2.5 MG/3ML) 0.083% nebulizer solution Take 3 mLs (2.5 mg total) by nebulization every 6 (six) hours as needed for wheezing or shortness of breath.   albuterol (VENTOLIN HFA) 108 (90 Base) MCG/ACT inhaler Inhale 2 puffs into the lungs every 4 (four) hours as needed for wheezing or shortness of breath.   BREZTRI AEROSPHERE 160-9-4.8 MCG/ACT AERO INHALE 2 INHALATIONS BY MOUTH  INTO THE LUNGS TWICE DAILY IN  THE MORNING AND AT BEDTIME   Cholecalciferol (VITAMIN D3) 25 MCG (1000 UT) capsule Take 1 capsule (1,000 Units total) by mouth daily.   clonazePAM (KLONOPIN) 0.5 MG tablet Take 0.5-1 tablets (0.25-0.5 mg total) by mouth 2 (two) times daily as needed for anxiety.   clonazePAM (KLONOPIN) 0.5 MG tablet Take 0.5-1 tablets (0.25-0.5 mg total) by mouth 2 (two) times daily as needed for anxiety.   clonazePAM (KLONOPIN) 0.5 MG tablet Take 0.5-1 tablets (0.25-0.5 mg total) by mouth 2 (two) times daily as needed for anxiety.   dicyclomine (BENTYL) 20 MG tablet Take 20 mg by mouth 2 (two) times daily.  escitalopram (LEXAPRO) 5 MG tablet Take 1 tablet (5 mg total) by mouth daily.   famotidine (PEPCID) 20 MG tablet Take 20 mg by mouth daily.   LINZESS 145 MCG CAPS capsule TAKE 1 CAPSULE(145 MCG) BY MOUTH DAILY   Magnesium 400 MG TABS Take by mouth 2 (two) times daily.   omeprazole (PRILOSEC) 40 MG capsule Take 1 capsule (40 mg total) by mouth daily.   ondansetron (ZOFRAN ODT) 8 MG disintegrating tablet Take 1 tablet (8 mg total) by mouth every 8 (eight) hours as needed for nausea or vomiting.   zolpidem (AMBIEN) 10 MG tablet Take 1 tablet  (10 mg total) by mouth at bedtime as needed for sleep.   clotrimazole-betamethasone (LOTRISONE) cream Apply 1 Application topically 2 (two) times daily. (Patient not taking: Reported on 06/26/2023)   cyclobenzaprine (FLEXERIL) 5 MG tablet Take 1 tablet (5 mg total) by mouth 2 (two) times daily as needed for muscle spasms. (Patient not taking: Reported on 06/26/2023)   triamcinolone (KENALOG) 0.025 % cream Apply 1 Application topically 3 (three) times daily as needed. (Patient not taking: Reported on 06/26/2023)   [DISCONTINUED] methylPREDNISolone (MEDROL DOSEPAK) 4 MG TBPK tablet Follow instructions on pill pack   No facility-administered encounter medications on file as of 06/26/2023.    Allergies (verified) Bee venom, Dicyclomine, Butalbital-apap-caffeine, Singulair  [montelukast sodium], Ciprofloxacin, Tylenol [acetaminophen], Amoxicillin, and Lyrica [pregabalin]   History: Past Medical History:  Diagnosis Date   Allergy    Anxiety    Arthritis    hands and feet   ASCUS (atypical squamous cells of undetermined significance) on Pap smear    Blood transfusion 1983   In [place]   BRCA2 gene mutation positive 2014   Bursitis    Cancer (HCC)    liver   Chronic constipation    Condyloma    COPD (chronic obstructive pulmonary disease) (HCC)    Depression    per pt h/o depression- no meds currently. Dx of bi-polar depression in EPIC   Emphysema of lung (HCC)    GERD (gastroesophageal reflux disease)    Headache(784.0)    migraines   Hot flashes, menopausal    HPV (human papilloma virus) infection    Lung nodule, multiple    Osteopenia    Osteoporosis 01/22/2018   Seizures (HCC)    unknown reason for seizures- last seizurein Sept 2012   Stroke [facility])    TIA (transient ischemic attack)    Past Surgical History:  Procedure Laterality Date   ABDOMINAL HYSTERECTOMY     ABDOMINOPLASTY     BREAST SURGERY     CHOLECYSTECTOMY     HERNIA REPAIR     LAPAROSCOPY  01/04/2011   Procedure:  LAPAROSCOPY OPERATIVE;  Surgeon: Hollie Salk C. Marice Potter, MD;  Location: [facility];  Service: Gynecology;  Laterality: N/A;   LAPAROSCOPY  09/14/2012   gall bladder and 1/2 liver   SALPINGOOPHORECTOMY  01/04/2011   Procedure: SALPINGO OOPHERECTOMY;  Surgeon: Myra C. Marice Potter, MD;  Location: [facility];  Service: Gynecology;  Laterality: Bilateral;   TOTAL VAGINAL HYSTERECTOMY  63 yrs old   Family History  Problem Relation Age of Onset   Breast cancer Mother        postmenopausal   Depression Mother    Drug abuse Mother    Drug abuse Father    Lung cancer Father    Alcohol abuse Daughter    Breast cancer Daughter    Drug abuse Sister    Breast cancer Maternal Aunt  Ovarian cancer Neg Hx    Uterine cancer Neg Hx    Social History   Socioeconomic History   Marital status: Divorced    Spouse name: Not on file   Number of children: 2   Years of education: 12th grade   Highest education level: Never attended school  Occupational History   Occupation: Disabled.  Tobacco Use   Smoking status: Every Day    Current packs/day: 0.50    Average packs/day: 0.5 packs/day for 51.8 years (25.9 ttl pk-yrs)    Types: Cigarettes    Start date: 09/16/1971   Smokeless tobacco: Never   Tobacco comments:    0.5 pack a day  Vaping Use   Vaping status: Never Used  Substance and Sexual Activity   Alcohol use: No   Drug use: Not Currently    Types: Cocaine, Marijuana    Comment: history of use   Sexual activity: Not Currently    Partners: Male  Other Topics Concern   Not on file  Social History Narrative   Lives alone with her cat. She has two children. She enjoys watching t.v.   Social Drivers of Health   Financial Resource Strain: Low Risk  (06/26/2023)   Overall Financial Resource Strain (CARDIA)    Difficulty of Paying Living Expenses: Not hard at all  Food Insecurity: No Food Insecurity (06/26/2023)   Hunger Vital Sign    Worried About Running Out of Food in the Last Year: Never true    Ran Out of  Food in the Last Year: Never true  Transportation Needs: No Transportation Needs (06/26/2023)   PRAPARE - Administrator, Civil Service (Medical): No    Lack of Transportation (Non-Medical): No  Recent Concern: Transportation Needs - Unmet Transportation Needs (04/22/2023)   PRAPARE - Transportation    Lack of Transportation (Medical): Yes    Lack of Transportation (Non-Medical): Yes  Physical Activity: Inactive (06/26/2023)   Exercise Vital Sign    Days of Exercise per Week: 0 days    Minutes of Exercise per Session: 0 min  Stress: Stress Concern Present (06/26/2023)   [employer] of Occupational Health - Occupational Stress Questionnaire    Feeling of Stress : To some extent  Social Connections: Socially Isolated (06/26/2023)   Social Connection and Isolation Panel [NHANES]    Frequency of Communication with Friends and Family: Three times a week    Frequency of Social Gatherings with Friends and Family: Once a week    Attends Religious Services: Never    Database administrator or Organizations: No    Attends Engineer, structural: Never    Marital Status: Divorced    Tobacco Counseling Ready to quit: Not Answered Counseling given: Not Answered Tobacco comments: 0.5 pack a day   Clinical Intake:  Pre-visit preparation completed: Yes  Pain : 0-10 Pain Score: 5  Pain Type: Chronic pain Pain Location: Leg Pain Orientation: Left, Right Pain Radiating Towards: no Pain Descriptors / Indicators: Aching, Sharp, Shooting, Sore Pain Onset: More than a month ago Pain Frequency: Constant Pain Relieving Factors: nothing Effect of Pain on Daily Activities: not much  Pain Relieving Factors: nothing  BMI - recorded: 21.3 Nutritional Status: BMI of 19-24  Normal Nutritional Risks: None Diabetes: No  How often do you need to have someone help you when you read instructions, pamphlets, or other written materials from your doctor or pharmacy?: 1 - Never What  is the last grade level you completed in  school?: 12  Interpreter Needed?: No      Activities of Daily Living    06/26/2023    9:55 AM 06/22/2023    8:50 AM  In your present state of health, do you have any difficulty performing the following activities:  Hearing? 0 0  Vision? 0 0  Difficulty concentrating or making decisions? 0 0  Walking or climbing stairs? 0 0  Dressing or bathing? 0 0  Doing errands, shopping? 0   Preparing Food and eating ? N   Using the Toilet? N N  In the past six months, have you accidently leaked urine? N N  Do you have problems with loss of bowel control? N   Managing your Medications? N N  Managing your Finances? N N  Housekeeping or managing your Housekeeping? N N    Patient Care Team: Charlton Amor, DO as PCP - General (Family Medicine) Monica Becton, MD as Consulting Physician (Sports Medicine)  Indicate any recent Medical Services you may have received from other than [facility] providers in the past year (date may be approximate).     Assessment:   This is a routine wellness examination for Erin [name].  Hearing/Vision screen No results found.   Goals Addressed             This Visit's Progress    DIET - INCREASE WATER INTAKE       She would like to increase water intake.       Depression Screen    06/26/2023   10:05 AM 11/28/2022    3:41 PM 07/26/2022    8:22 AM 06/27/2022    3:18 PM 06/25/2022    8:49 AM 01/22/2022   10:21 AM 01/22/2022   10:20 AM  PHQ 2/9 Scores  PHQ - 2 Score 0 0 4 0 0 1 6  PHQ- 9 Score   8 14  9      Fall Risk    06/26/2023   10:07 AM 06/22/2023    8:50 AM 11/28/2022    3:40 PM 06/27/2022    2:08 PM 06/25/2022    8:48 AM  Fall Risk   Falls in the past year? 1 1 0 0 1  Number falls in past yr: 1 1 0 0 1  Injury with Fall? 1 1 0 0 1  Risk for fall due to : Other (Comment)  No Fall Risks No Fall Risks History of fall(s);Impaired balance/gait  Risk for fall due to: Comment She states she passes out.       Follow up Falls evaluation completed  Falls evaluation completed Falls evaluation completed Falls evaluation completed;Education provided;Falls prevention discussed    MEDICARE RISK AT HOME: Medicare Risk at Home Any stairs in or around the home?: Yes If so, are there any without handrails?: No Home free of loose throw rugs in walkways, pet beds, electrical cords, etc?: No Adequate lighting in your home to reduce risk of falls?: Yes Life alert?: No Use of a cane, walker or w/c?: Yes Grab bars in the bathroom?: No Shower chair or bench in shower?: No Elevated toilet seat or a handicapped toilet?: No  TIMED UP AND GO:  Was the test performed?  No    Cognitive Function:        06/26/2023   10:09 AM 06/25/2022    8:54 AM 11/28/2020   11:13 AM  6CIT Screen  What Year? 0 points 0 points 0 points  What month? 0 points 0 points 0 points  What time? 0 points 0 points 0 points  Count back from 20 0 points 0 points 0 points  Months in reverse 4 points 4 points 0 points  Repeat phrase 0 points 0 points 0 points  Total Score 4 points 4 points 0 points    Immunizations Immunization History  Administered Date(s) Administered   Influenza, Seasonal, Injecte, Preservative Fre 01/29/2023   Influenza,inj,Quad PF,6+ Mos 02/03/2019, 01/01/2020, 12/20/2021   PFIZER(Purple Top)SARS-COV-2 Vaccination 07/16/2019, 08/10/2019, 01/16/2020   PNEUMOCOCCAL CONJUGATE-20 04/15/2023   Pneumococcal Polysaccharide-23 02/03/2015   Tdap 05/08/2010, 07/31/2021   Zoster Recombinant(Shingrix) 07/31/2021, 12/20/2021   Zoster, Live 01/04/2014    TDAP status: Up to date  Flu Vaccine status: Up to date  Pneumococcal vaccine status: Up to date  Covid-19 vaccine status: Completed vaccines  Qualifies for Shingles Vaccine? Yes   Zostavax completed Yes   Shingrix Completed?: Yes  Screening Tests Health Maintenance  Topic Date Due   HIV Screening  Never done   Hepatitis C Screening  Never done   Lung  Cancer Screening  09/21/2022   COVID-19 Vaccine (4 - 2024-25 season) 12/16/2022   MAMMOGRAM  06/13/2024   Medicare Annual Wellness (AWV)  06/25/2024   Colonoscopy  10/26/2028   DTaP/Tdap/Td (3 - Td or Tdap) 08/01/2031   Pneumococcal Vaccine 34-78 Years old  Completed   INFLUENZA VACCINE  Completed   Zoster Vaccines- Shingrix  Completed   HPV VACCINES  Aged Out    Health Maintenance  Health Maintenance Due  Topic Date Due   HIV Screening  Never done   Hepatitis C Screening  Never done   Lung Cancer Screening  09/21/2022   COVID-19 Vaccine (4 - 2024-25 season) 12/16/2022    Colorectal cancer screening: Type of screening: Colonoscopy. Completed 10/27/2018. Repeat every 5 years  Mammogram status: Completed 06/13/2022. Repeat every year   Lung Cancer Screening: (Low Dose CT Chest recommended if Age 23-80 years, 20 pack-year currently smoking OR have quit w/in 15years.) does qualify.   Lung Cancer Screening Referral: already in the program  Additional Screening:  Hepatitis C Screening: does qualify; Completed not completed  Vision Screening: Recommended annual ophthalmology exams for early detection of glaucoma and other disorders of the eye. Is the patient up to date with their annual eye exam?  Yes  Who is the provider or what is the name of the office in which the patient attends annual eye exams? [email or web address] If pt is not established with a provider, would they like to be referred to a provider to establish care?  N/a .   Dental Screening: Recommended annual dental exams for proper oral hygiene   Community Resource Referral / Chronic Care Management: CRR required this visit?  No   CCM required this visit?  No     Plan:     I have personally reviewed and noted the following in the patient's chart:   Medical and social history Use of alcohol, tobacco or illicit drugs  Current medications and supplements including opioid prescriptions. Patient is not currently  taking opioid prescriptions. Functional ability and status Nutritional status Physical activity Advanced directives List of other physicians Hospitalizations, surgeries, and ER visits in previous 12 months Vitals Screenings to include cognitive, depression, and falls Referrals and appointments  In addition, I have reviewed and discussed with patient certain preventive protocols, quality metrics, and best practice recommendations. A written personalized care plan for preventive services as well as general preventive health recommendations were provided to patient.  Esmond Harps, CMA   06/26/2023   After Visit Summary: (MyChart) Due to this being a telephonic visit, the after visit summary with patients personalized plan was offered to patient via MyChart   Nurse Notes:   Erin [name] is a 63 y.o. y.o. female patient of Dr Tamera Punt who had a Medicare Annual Wellness Visit today via telephone. She reports that she is socially active and does interact with friends/family regularly. She is moderately physically active and enjoys watching television.

## 2023-06-26 NOTE — Patient Instructions (Signed)
  Ms. Erin [name] , Thank you for taking time to come for your Medicare Wellness Visit. I appreciate your ongoing commitment to your health goals. Please review the following plan we discussed and let me know if I can assist you in the future.   These are the goals we discussed:  Goals       DIET - INCREASE WATER INTAKE      She would like to increase water intake.       Patient Stated (pt-stated)      11/28/2020 AWV Goal: Exercise for General Health  Patient will verbalize understanding of the benefits of increased physical activity: Exercising regularly is important. It will improve your overall fitness, flexibility, and endurance. Regular exercise also will improve your overall health. It can help you control your weight, reduce stress, and improve your bone density. Over the next year, patient will increase physical activity as tolerated with a goal of at least 150 minutes of moderate physical activity per week.  You can tell that you are exercising at a moderate intensity if your heart starts beating faster and you start breathing faster but can still hold a conversation. Moderate-intensity exercise ideas include: Walking 1 mile (1.6 km) in about 15 minutes Biking Hiking Golfing Dancing Water aerobics Patient will verbalize understanding of everyday activities that increase physical activity by providing examples like the following: Yard work, such as: Insurance underwriter Gardening Washing windows or floors Patient will be able to explain general safety guidelines for exercising:  Before you start a new exercise program, talk with your health care provider. Do not exercise so much that you hurt yourself, feel dizzy, or get very short of breath. Wear comfortable clothes and wear shoes with good support. Drink plenty of water while you exercise to prevent dehydration or heat stroke. Work out until your  breathing and your heartbeat get faster.       Patient Stated (pt-stated)      Patient states she would like to be more active.        This is a list of the screening recommended for you and due dates:  Health Maintenance  Topic Date Due   HIV Screening  Never done   Hepatitis C Screening  Never done   DEXA scan (bone density measurement)  08/04/2021   Screening for Lung Cancer  09/21/2022   COVID-19 Vaccine (4 - 2024-25 season) 12/16/2022   Mammogram  06/13/2024   Medicare Annual Wellness Visit  06/25/2024   Colon Cancer Screening  10/26/2028   DTaP/Tdap/Td vaccine (3 - Td or Tdap) 08/01/2031   Pneumococcal Vaccination  Completed   Flu Shot  Completed   Zoster (Shingles) Vaccine  Completed   HPV Vaccine  Aged Out

## 2023-07-05 ENCOUNTER — Other Ambulatory Visit: Payer: Self-pay

## 2023-07-05 DIAGNOSIS — I739 Peripheral vascular disease, unspecified: Secondary | ICD-10-CM

## 2023-07-17 ENCOUNTER — Encounter: Payer: 59 | Admitting: Vascular Surgery

## 2023-07-17 ENCOUNTER — Ambulatory Visit (HOSPITAL_COMMUNITY)
Admission: RE | Admit: 2023-07-17 | Discharge: 2023-07-17 | Disposition: A | Discharge status: Home / Self Care | Payer: 59 | Source: Ambulatory Visit | Attending: Vascular Surgery | Admitting: Vascular Surgery

## 2023-07-17 DIAGNOSIS — J4489 Other specified chronic obstructive pulmonary disease: Secondary | ICD-10-CM | POA: Diagnosis not present

## 2023-07-17 DIAGNOSIS — I739 Peripheral vascular disease, unspecified: Secondary | ICD-10-CM

## 2023-08-15 DIAGNOSIS — C221 Intrahepatic bile duct carcinoma: Secondary | ICD-10-CM | POA: Diagnosis not present

## 2023-08-15 DIAGNOSIS — K219 Gastro-esophageal reflux disease without esophagitis: Secondary | ICD-10-CM | POA: Diagnosis not present

## 2023-08-15 DIAGNOSIS — Z79899 Other long term (current) drug therapy: Secondary | ICD-10-CM | POA: Diagnosis not present

## 2023-08-15 DIAGNOSIS — Z720 Tobacco use: Secondary | ICD-10-CM | POA: Diagnosis not present

## 2023-08-15 DIAGNOSIS — M8589 Other specified disorders of bone density and structure, multiple sites: Secondary | ICD-10-CM | POA: Diagnosis not present

## 2023-08-15 DIAGNOSIS — E559 Vitamin D deficiency, unspecified: Secondary | ICD-10-CM | POA: Diagnosis not present

## 2023-08-15 DIAGNOSIS — N200 Calculus of kidney: Secondary | ICD-10-CM | POA: Diagnosis not present

## 2023-08-16 DIAGNOSIS — J4489 Other specified chronic obstructive pulmonary disease: Secondary | ICD-10-CM | POA: Diagnosis not present

## 2023-08-22 ENCOUNTER — Encounter: Payer: Self-pay | Admitting: Family Medicine

## 2023-09-02 ENCOUNTER — Telehealth: Payer: Self-pay

## 2023-09-02 NOTE — Telephone Encounter (Signed)
 Patient would like to transfer care to Dr. Courtland Ditch. Please advise?

## 2023-09-10 ENCOUNTER — Ambulatory Visit: Admitting: Family Medicine

## 2023-09-11 ENCOUNTER — Encounter: Admitting: Family Medicine

## 2023-09-16 DIAGNOSIS — J4489 Other specified chronic obstructive pulmonary disease: Secondary | ICD-10-CM | POA: Diagnosis not present

## 2023-09-24 ENCOUNTER — Ambulatory Visit (INDEPENDENT_AMBULATORY_CARE_PROVIDER_SITE_OTHER): Admitting: Family Medicine

## 2023-09-24 ENCOUNTER — Encounter: Payer: Self-pay | Admitting: Family Medicine

## 2023-09-24 VITALS — BP 103/62 | HR 76 | Temp 98.2°F | Resp 18 | Ht 67.0 in | Wt 139.6 lb

## 2023-09-24 DIAGNOSIS — Z5181 Encounter for therapeutic drug level monitoring: Secondary | ICD-10-CM

## 2023-09-24 DIAGNOSIS — M25541 Pain in joints of right hand: Secondary | ICD-10-CM

## 2023-09-24 DIAGNOSIS — R5382 Chronic fatigue, unspecified: Secondary | ICD-10-CM

## 2023-09-24 DIAGNOSIS — Z79899 Other long term (current) drug therapy: Secondary | ICD-10-CM

## 2023-09-24 DIAGNOSIS — M25542 Pain in joints of left hand: Secondary | ICD-10-CM

## 2023-09-24 DIAGNOSIS — F5101 Primary insomnia: Secondary | ICD-10-CM | POA: Diagnosis not present

## 2023-09-24 DIAGNOSIS — K2100 Gastro-esophageal reflux disease with esophagitis, without bleeding: Secondary | ICD-10-CM

## 2023-09-24 DIAGNOSIS — F411 Generalized anxiety disorder: Secondary | ICD-10-CM | POA: Diagnosis not present

## 2023-09-24 DIAGNOSIS — K581 Irritable bowel syndrome with constipation: Secondary | ICD-10-CM

## 2023-09-24 DIAGNOSIS — J431 Panlobular emphysema: Secondary | ICD-10-CM | POA: Diagnosis not present

## 2023-09-24 MED ORDER — ZOLPIDEM TARTRATE 10 MG PO TABS: 10.0000 mg | ORAL_TABLET | Freq: Every evening | ORAL | 1 refills | Status: DC | PRN

## 2023-09-24 MED ORDER — CLONAZEPAM 0.5 MG PO TABS: 0.2500 mg | ORAL_TABLET | Freq: Two times a day (BID) | ORAL | 0 refills | Status: DC | PRN

## 2023-09-24 NOTE — Progress Notes (Signed)
 Established Patient Office Visit  Subjective   Patient ID: Erin [name], female    DOB: [birth year]  Age: 63 y.o. MRN: [id number]  Chief Complaint  Patient presents with   transfer of care    Patient was previously seen by Dr. Dianah Fort, patient is here today for medication managment    HPI  Pt is new to me. Previous pt of Dr Dianah Fort'.  Pt has hx of anxiety. She is from [place] and has 2 children in town. One of her grandsons is 52 y.o and is autistic.  She uses Klonopin  0.5mg  1/2 -1 tab po daily prn. She is using Ambien  10mg  at bedtime prn for sleep. She is also taking Lexapro  5mg  daily.  She has COPD and using Breztri  and Albuterol  inhaler prn. Stable on this regimen.  She has chronic constipation/IBS and using Linzess  145mg  daily prn and Bentyl BID.  She has hx of GERD and using Omeprazole  40mg  and Pepcid 20mg  daily.   She also would like vitamin deficiencies such as B12 and iron. She also reports joint pains in her hands and would like to be screened for RA.    Review of Systems  Musculoskeletal:  Positive for joint pain.  Psychiatric/Behavioral:  The patient is nervous/anxious and has insomnia.   All other systems reviewed and are negative.     Objective:     BP 103/62   Pulse 76   Temp 98.2 F (36.8 C) (Oral)   Resp 18   Ht 5\' 7"  (1.702 m)   Wt 139 lb 9.6 oz (63.3 kg)   SpO2 96%   BMI 21.86 kg/m  BP Readings from Last 3 Encounters:  09/24/23 103/62  05/28/23 115/75  04/23/23 127/73      Physical Exam Vitals and nursing note reviewed.  Constitutional:      Appearance: Normal appearance. She is normal weight.  HENT:     Head: Normocephalic and atraumatic.     Right Ear: External ear normal.     Left Ear: External ear normal.     Nose: Nose normal.     Mouth/Throat:     Mouth: Mucous membranes are moist.     Pharynx: Oropharynx is clear.  Eyes:     Conjunctiva/sclera: Conjunctivae normal.     Pupils: Pupils are equal, round, and reactive to light.   Cardiovascular:     Rate and Rhythm: Normal rate.  Pulmonary:     Effort: Pulmonary effort is normal.  Skin:    General: Skin is warm.     Capillary Refill: Capillary refill takes less than 2 seconds.  Neurological:     General: No focal deficit present.     Mental Status: She is alert and oriented to person, place, and time. Mental status is at baseline.  Psychiatric:        Mood and Affect: Mood normal.        Behavior: Behavior normal.        Thought Content: Thought content normal.        Judgment: Judgment normal.     Comments: anxious      No results found for any visits on 09/24/23.     The ASCVD Risk score (Arnett DK, et al., 2019) failed to calculate for the following reasons:   Risk score cannot be calculated because patient has a medical history suggesting prior/existing ASCVD    Assessment & Plan:   Problem List Items Addressed This Visit   None  Generalized anxiety disorder -  clonazePAM ; Take 0.5-1 tablets (0.25-0.5 mg total) by mouth 2 (two) times daily as needed for anxiety.  Dispense: 60 tablet; Refill: 0  Primary insomnia -     Zolpidem  Tartrate; Take 1 tablet (10 mg total) by mouth at bedtime as needed for sleep.  Dispense: 30 tablet; Refill: 1  Panlobular emphysema (HCC)  Gastroesophageal reflux disease with esophagitis without hemorrhage  Medication monitoring encounter -     ToxASSURE Select 13 (MW), Urine  Controlled substance agreement signed  Chronic fatigue -     Vitamin B12 -     VITAMIN D  25 Hydroxy (Vit-D Deficiency, Fractures) -     TSH -     T4, free -     Folate -     CBC with Differential/Platelet  Arthralgia of both hands -     Rheumatoid factor  Irritable bowel syndrome with constipation    No follow-ups on file.   Pt with Ambien  10mg  and Klonopin  0.5mg  refills. CSA and medication monitoring done today.  Pt with multiple requests for labs due to fatigue and joint pains. Will screen for vitamin deficiencies,  thyroid along with RA.  See back in 3 months chronic condition follow up.  Manette Section, MD

## 2023-10-16 DIAGNOSIS — J4489 Other specified chronic obstructive pulmonary disease: Secondary | ICD-10-CM | POA: Diagnosis not present

## 2023-11-15 ENCOUNTER — Other Ambulatory Visit: Payer: Self-pay | Admitting: Pulmonary Disease

## 2023-11-16 DIAGNOSIS — J4489 Other specified chronic obstructive pulmonary disease: Secondary | ICD-10-CM | POA: Diagnosis not present

## 2023-12-09 DIAGNOSIS — Z1231 Encounter for screening mammogram for malignant neoplasm of breast: Secondary | ICD-10-CM | POA: Diagnosis not present

## 2023-12-09 DIAGNOSIS — R92323 Mammographic fibroglandular density, bilateral breasts: Secondary | ICD-10-CM | POA: Diagnosis not present

## 2023-12-17 ENCOUNTER — Encounter: Payer: Self-pay | Admitting: Sports Medicine

## 2023-12-17 DIAGNOSIS — J4489 Other specified chronic obstructive pulmonary disease: Secondary | ICD-10-CM | POA: Diagnosis not present

## 2023-12-25 ENCOUNTER — Ambulatory Visit: Attending: Family Medicine

## 2023-12-25 ENCOUNTER — Encounter: Payer: Self-pay | Admitting: Family Medicine

## 2023-12-25 ENCOUNTER — Other Ambulatory Visit: Payer: Self-pay | Admitting: Family Medicine

## 2023-12-25 ENCOUNTER — Ambulatory Visit (INDEPENDENT_AMBULATORY_CARE_PROVIDER_SITE_OTHER): Admitting: Family Medicine

## 2023-12-25 VITALS — BP 123/79 | HR 71 | Temp 97.6°F | Resp 18 | Ht 67.0 in | Wt 140.1 lb

## 2023-12-25 DIAGNOSIS — Z23 Encounter for immunization: Secondary | ICD-10-CM | POA: Diagnosis not present

## 2023-12-25 DIAGNOSIS — R002 Palpitations: Secondary | ICD-10-CM | POA: Diagnosis not present

## 2023-12-25 DIAGNOSIS — R768 Other specified abnormal immunological findings in serum: Secondary | ICD-10-CM | POA: Diagnosis not present

## 2023-12-25 DIAGNOSIS — F411 Generalized anxiety disorder: Secondary | ICD-10-CM | POA: Diagnosis not present

## 2023-12-25 DIAGNOSIS — E559 Vitamin D deficiency, unspecified: Secondary | ICD-10-CM | POA: Diagnosis not present

## 2023-12-25 DIAGNOSIS — M818 Other osteoporosis without current pathological fracture: Secondary | ICD-10-CM | POA: Diagnosis not present

## 2023-12-25 DIAGNOSIS — C221 Intrahepatic bile duct carcinoma: Secondary | ICD-10-CM | POA: Diagnosis not present

## 2023-12-25 DIAGNOSIS — Z1231 Encounter for screening mammogram for malignant neoplasm of breast: Secondary | ICD-10-CM | POA: Diagnosis not present

## 2023-12-25 DIAGNOSIS — F5101 Primary insomnia: Secondary | ICD-10-CM | POA: Diagnosis not present

## 2023-12-25 DIAGNOSIS — F17210 Nicotine dependence, cigarettes, uncomplicated: Secondary | ICD-10-CM | POA: Diagnosis not present

## 2023-12-25 DIAGNOSIS — J449 Chronic obstructive pulmonary disease, unspecified: Secondary | ICD-10-CM | POA: Diagnosis not present

## 2023-12-25 MED ORDER — ZOLPIDEM TARTRATE 10 MG PO TABS: 10.0000 mg | ORAL_TABLET | Freq: Every evening | ORAL | 1 refills | Status: DC | PRN

## 2023-12-25 NOTE — Progress Notes (Unsigned)
 EP to read.

## 2023-12-25 NOTE — Progress Notes (Signed)
 Established Patient Office Visit  Subjective   Patient ID: Erin [name], female    DOB: 09-Jul-1960  Age: 63 y.o. MRN: [id number]  Chief Complaint  Patient presents with   Follow-up    Patient is here for a 3 month follow up Patient would also like to discuss her weight and possibly going to see Healthy weight and wellness    HPI  Depression/anxiety Pt reports she has periods of depression/anxiety. She is taking Lexapro  5mg  daily and Klonopin  0.5mg  1/2 tab BID prn and may have extra half during the day. She doesn't use them every day. She needs this refilled. She also has Ambien  10mg  daily for sleep. She needs this refilled. She reports worsening palpitations and asks for referral to see cardiologist.   She reports family hx of fatty liver disease.  She is worried about her weight. She has BMI of 21. She reports alcohol use but not much. Would like her liver tests to rule out fatty liver.   She wants her thyroid checked as well. She was seen back in June and had labs placed. These are still pending and pt willing to do today.    Review of Systems  Cardiovascular:  Positive for palpitations.  All other systems reviewed and are negative.     Objective:     BP 123/79   Pulse 71   Temp 97.6 F (36.4 C) (Oral)   Resp 18   Ht 5' 7 (1.702 m)   Wt 140 lb 1.6 oz (63.5 kg)   SpO2 97%   BMI 21.94 kg/m  BP Readings from Last 3 Encounters:  12/25/23 123/79  09/24/23 103/62  05/28/23 115/75      Physical Exam Vitals and nursing note reviewed.  Constitutional:      Appearance: Normal appearance. She is normal weight.  HENT:     Head: Normocephalic and atraumatic.     Right Ear: External ear normal.     Left Ear: External ear normal.     Nose: Nose normal.     Mouth/Throat:     Mouth: Mucous membranes are moist.     Pharynx: Oropharynx is clear.  Eyes:     Conjunctiva/sclera: Conjunctivae normal.     Pupils: Pupils are equal, round, and reactive to light.   Cardiovascular:     Rate and Rhythm: Normal rate.  Pulmonary:     Effort: Pulmonary effort is normal.  Skin:    General: Skin is warm.     Capillary Refill: Capillary refill takes less than 2 seconds.  Neurological:     General: No focal deficit present.     Mental Status: She is alert and oriented to person, place, and time. Mental status is at baseline.  Psychiatric:        Thought Content: Thought content normal.        Judgment: Judgment normal.     Comments: anxious      No results found for any visits on 12/25/23.     The ASCVD Risk score (Arnett DK, et al., 2019) failed to calculate for the following reasons:   Risk score cannot be calculated because patient has a medical history suggesting prior/existing ASCVD    Assessment & Plan:   Problem List Items Addressed This Visit   None Primary insomnia -     Comprehensive metabolic panel with GFR -     Zolpidem  Tartrate; Take 1 tablet (10 mg total) by mouth at bedtime as needed for sleep.  Dispense:  30 tablet; Refill: 1  Generalized anxiety disorder -     Comprehensive metabolic panel with GFR  Need for influenza vaccination -     Flu vaccine trivalent PF, 6mos and older(Flulaval,Afluria,Fluarix,Fluzone)  Palpitations -     LONG TERM MONITOR (3-14 DAYS); Future   Pt with hx of insomnia and anxiety. Needs Ambien  refilled.  She is worried about fatty liver and added CMP to her labs from June.  Flu vaccine today For palpitations, explained to pt could be from anxiety. Will send for ziopatch first and defer cardiology referral.  Explained to pt that cardiology wouldn't accept referrals for palpitations unless this evaluation is done first as many other conditions can cause palpitations including anxiety which pt has. She voiced understanding.   No follow-ups on file.    Torrence CINDERELLA Barrier, MD

## 2023-12-26 ENCOUNTER — Ambulatory Visit: Payer: Self-pay | Admitting: Family Medicine

## 2023-12-26 LAB — CBC WITH DIFFERENTIAL/PLATELET
Basophils Absolute: 0 x10E3/uL (ref 0.0–0.2)
Basos: 1 %
EOS (ABSOLUTE): 0.1 x10E3/uL (ref 0.0–0.4)
Eos: 1 %
Hematocrit: 42.1 % (ref 34.0–46.6)
Hemoglobin: 13.4 g/dL (ref 11.1–15.9)
Immature Grans (Abs): 0 x10E3/uL (ref 0.0–0.1)
Immature Granulocytes: 0 %
Lymphocytes Absolute: 1.6 x10E3/uL (ref 0.7–3.1)
Lymphs: 28 %
MCH: 30.9 pg (ref 26.6–33.0)
MCHC: 31.8 g/dL (ref 31.5–35.7)
MCV: 97 fL (ref 79–97)
Monocytes Absolute: 0.3 x10E3/uL (ref 0.1–0.9)
Monocytes: 6 %
Neutrophils Absolute: 3.5 x10E3/uL (ref 1.4–7.0)
Neutrophils: 64 %
Platelets: 235 x10E3/uL (ref 150–450)
RBC: 4.33 x10E6/uL (ref 3.77–5.28)
RDW: 12 % (ref 11.7–15.4)
WBC: 5.5 x10E3/uL (ref 3.4–10.8)

## 2023-12-26 LAB — COMPREHENSIVE METABOLIC PANEL WITH GFR
ALT: 12 IU/L (ref 0–32)
AST: 22 IU/L (ref 0–40)
Albumin: 4.7 g/dL (ref 3.9–4.9)
Alkaline Phosphatase: 114 IU/L (ref 44–121)
BUN/Creatinine Ratio: 15 (ref 12–28)
BUN: 10 mg/dL (ref 8–27)
Bilirubin Total: 0.4 mg/dL (ref 0.0–1.2)
CO2: 23 mmol/L (ref 20–29)
Calcium: 9.9 mg/dL (ref 8.7–10.3)
Chloride: 103 mmol/L (ref 96–106)
Creatinine, Ser: 0.68 mg/dL (ref 0.57–1.00)
Globulin, Total: 2.3 g/dL (ref 1.5–4.5)
Glucose: 84 mg/dL (ref 70–99)
Potassium: 3.9 mmol/L (ref 3.5–5.2)
Sodium: 141 mmol/L (ref 134–144)
Total Protein: 7 g/dL (ref 6.0–8.5)
eGFR: 98 mL/min/1.73 (ref >59)

## 2023-12-26 LAB — VITAMIN B12: Vitamin B-12: 326 pg/mL (ref 232–1245)

## 2023-12-26 LAB — TSH: TSH: 1.97 u[IU]/mL (ref 0.450–4.500)

## 2023-12-26 LAB — FOLATE: Folate: 4.6 ng/mL (ref >3.0)

## 2023-12-26 LAB — VITAMIN D 25 HYDROXY (VIT D DEFICIENCY, FRACTURES): Vit D, 25-Hydroxy: 33.7 ng/mL (ref 30.0–100.0)

## 2023-12-26 LAB — T4, FREE: Free T4: 1.07 ng/dL (ref 0.82–1.77)

## 2023-12-27 ENCOUNTER — Ambulatory Visit: Payer: Self-pay | Admitting: Family Medicine

## 2024-01-02 DIAGNOSIS — L821 Other seborrheic keratosis: Secondary | ICD-10-CM | POA: Diagnosis not present

## 2024-01-02 DIAGNOSIS — L814 Other melanin hyperpigmentation: Secondary | ICD-10-CM | POA: Diagnosis not present

## 2024-01-02 DIAGNOSIS — L578 Other skin changes due to chronic exposure to nonionizing radiation: Secondary | ICD-10-CM | POA: Diagnosis not present

## 2024-01-02 DIAGNOSIS — D225 Melanocytic nevi of trunk: Secondary | ICD-10-CM | POA: Diagnosis not present

## 2024-01-15 DIAGNOSIS — R002 Palpitations: Secondary | ICD-10-CM | POA: Diagnosis not present

## 2024-01-16 DIAGNOSIS — J4489 Other specified chronic obstructive pulmonary disease: Secondary | ICD-10-CM | POA: Diagnosis not present

## 2024-01-16 DIAGNOSIS — R002 Palpitations: Secondary | ICD-10-CM

## 2024-01-20 ENCOUNTER — Telehealth: Payer: Self-pay

## 2024-01-20 DIAGNOSIS — M5441 Lumbago with sciatica, right side: Secondary | ICD-10-CM

## 2024-01-20 NOTE — Telephone Encounter (Signed)
 Spoke with patient and she is aware that PCP is out of the office until January and states that she is having back pain , with a history of back pain and sciatica,   Informed patient that she would  need an appointment to get a referral to Ortho. Patient declines scheduling an appointment and just wants a referral.   Please Advise   Copied from CRM #[id number]. Topic: Referral - Request for Referral >> Jan 20, 2024 10:00 AM Suzen RAMAN wrote: Did the patient discuss referral with their provider in the last year? No (If No - schedule appointment) (If Yes - send message)  Patient declined to schedule an appointment   Appointment offered? No  Type of order/referral and detailed reason for visit: Orthopedic-[facility], back pain  Preference of office, provider, location: [facility]  If referral order, have you been seen by this specialty before? No (If Yes, this issue or another issue? When? Where?  Can we respond through MyChart? Yes

## 2024-01-20 NOTE — Telephone Encounter (Signed)
 Okay to place Ortho referral.

## 2024-01-21 NOTE — Addendum Note (Signed)
 Addended by: Katriana Dortch P on: 01/21/2024 11:56 AM   Modules accepted: Orders

## 2024-01-23 DIAGNOSIS — F17210 Nicotine dependence, cigarettes, uncomplicated: Secondary | ICD-10-CM | POA: Diagnosis not present

## 2024-01-31 ENCOUNTER — Other Ambulatory Visit: Payer: Self-pay | Admitting: Family Medicine

## 2024-01-31 DIAGNOSIS — I471 Supraventricular tachycardia, unspecified: Secondary | ICD-10-CM | POA: Insufficient documentation

## 2024-02-16 DIAGNOSIS — J4489 Other specified chronic obstructive pulmonary disease: Secondary | ICD-10-CM | POA: Diagnosis not present

## 2024-02-17 ENCOUNTER — Encounter: Payer: Self-pay | Admitting: Family Medicine

## 2024-02-17 ENCOUNTER — Ambulatory Visit

## 2024-02-17 ENCOUNTER — Ambulatory Visit (INDEPENDENT_AMBULATORY_CARE_PROVIDER_SITE_OTHER): Admitting: Family Medicine

## 2024-02-17 ENCOUNTER — Ambulatory Visit: Payer: Self-pay | Admitting: Family Medicine

## 2024-02-17 VITALS — BP 104/68 | HR 71 | Ht 67.0 in | Wt 138.0 lb

## 2024-02-17 DIAGNOSIS — S79912A Unspecified injury of left hip, initial encounter: Secondary | ICD-10-CM | POA: Diagnosis not present

## 2024-02-17 MED ORDER — NAPROXEN 500 MG PO TABS: 500.0000 mg | ORAL_TABLET | Freq: Two times a day (BID) | ORAL | 0 refills | Status: AC

## 2024-02-17 NOTE — Progress Notes (Signed)
   Acute Office Visit  Subjective:     Patient ID: Erin [name], female    DOB: 08-05-1960, 63 y.o.   MRN: [id number]  Chief Complaint  Patient presents with   Fall    Left hip pain - radiating down leg Almost 2 weeks ago slipped in shower - this morning almost the same thing happened she slipped but didn't fall - but experienced same pain Can't sleep     HPI Patient is in today for left hip pain after falling in the shower.  Occurred 2 weeks ago and pain lis still severe. Advil and heating pad have not helped. Pain is severe.    ROS      Objective:    BP 104/68 (BP Location: Left Arm, Patient Position: Sitting, Cuff Size: Normal)   Pulse 71   Ht 5' 7 (1.702 m)   Wt 138 lb (62.6 kg)   SpO2 96%   BMI 21.61 kg/m    Physical Exam Vitals and nursing note reviewed.  Constitutional:      General: She is not in acute distress.    Appearance: Normal appearance.  Pulmonary:     Effort: Pulmonary effort is normal.  Musculoskeletal:     Left lower leg: Tenderness and bony tenderness present. No swelling or deformity.     Comments: Tenderness in left hip/pelvis.  Decreased ROM in hip. Guarding hip with movement. Walked into clinic.  Skin:    General: Skin is warm and dry.  Neurological:     General: No focal deficit present.     Mental Status: She is alert. Mental status is at baseline.  Psychiatric:        Mood and Affect: Mood normal.        Behavior: Behavior normal.        Thought Content: Thought content normal.        Judgment: Judgment normal.     No results found for any visits on 02/17/24.      Assessment & Plan:   Problem List Items Addressed This Visit     Injury of left hip region - Primary   Fell in shower 2 weeks ago onto left side of body. Left hip/pelvis is continuing to be painful. Advil and heating pad are not managing her pain. Decreased ROM. Tenderness to palpation in left hip/pelvis area. Stat x-ray. Understands if there is a  fracture, she will need to go to ED for treatment. Follow-up based on results.       Relevant Orders   DG Hip Unilat W OR W/O Pelvis Min 4 Views Left  Agrees with plan of care discussed.  Questions answered.      Return if symptoms worsen or fail to improve.  [name], FNP

## 2024-02-17 NOTE — Assessment & Plan Note (Signed)
 Fell in shower 2 weeks ago onto left side of body. Left hip/pelvis is continuing to be painful. Advil and heating pad are not managing her pain. Decreased ROM. Tenderness to palpation in left hip/pelvis area. Stat x-ray. Understands if there is a fracture, she will need to go to ED for treatment. Follow-up based on results.

## 2024-02-17 NOTE — Telephone Encounter (Signed)
 Copied from CRM [id number]. Topic: Referral - Question >> Feb 17, 2024 12:39 PM [name] wrote: Reason for CRM: Patient was referred to [facility]opedics [facility] but is requesting it be changed to: [facility] - [facility] [address] [facility] [address], [place], [place] [address] [phone]  Patient can be reached at [phone]

## 2024-02-17 NOTE — Patient Instructions (Signed)
 [facility]  [address]  The radiology department is on the first floor which is best accessed by going around to the back of the building. No appointment necessary. You can go at your convenience.

## 2024-02-18 ENCOUNTER — Ambulatory Visit (HOSPITAL_BASED_OUTPATIENT_CLINIC_OR_DEPARTMENT_OTHER): Admitting: Student

## 2024-03-23 ENCOUNTER — Ambulatory Visit (HOSPITAL_BASED_OUTPATIENT_CLINIC_OR_DEPARTMENT_OTHER): Admitting: Cardiology

## 2024-03-30 ENCOUNTER — Ambulatory Visit: Admitting: Family Medicine

## 2024-03-30 VITALS — BP 133/73 | HR 68 | Temp 98.2°F | Ht 67.0 in | Wt 137.0 lb

## 2024-03-30 DIAGNOSIS — H66001 Acute suppurative otitis media without spontaneous rupture of ear drum, right ear: Secondary | ICD-10-CM | POA: Insufficient documentation

## 2024-03-30 DIAGNOSIS — F5101 Primary insomnia: Secondary | ICD-10-CM

## 2024-03-30 MED ORDER — ZOLPIDEM TARTRATE 10 MG PO TABS: 10.0000 mg | ORAL_TABLET | Freq: Every evening | ORAL | 0 refills | Status: DC | PRN

## 2024-03-30 MED ORDER — AZITHROMYCIN 250 MG PO TABS: ORAL_TABLET | ORAL | 0 refills | Status: AC

## 2024-03-30 NOTE — Progress Notes (Signed)
 Acute Office Visit  Subjective:     Patient ID: Erin [name], female    DOB: [birth year], 63 y.o.   MRN: [id number]  Chief Complaint  Patient presents with   Ear Pain    Right ear - Onset Friday evening - caster oil has helped - could not blow her nose, sneeze, cough bc it felt like she was going to blow her ear drum out - believes weather had a part in the situation  If possible would like to go ahead and get a refill on ambien     HPI Patient is in today for right ear pain and feeling clogged. Used castor oil and it did not take symptoms away. Symptoms started on Friday. Never had ear infection in the past.    Insomnia:  PDMP: # 30 Ambien  10 mg   ROS      Objective:    BP 133/73 (BP Location: Left Arm, Patient Position: Sitting, Cuff Size: Small)   Pulse 68   Temp 98.2 F (36.8 C) (Oral)   Ht 5' 7 (1.702 m)   Wt 137 lb (62.1 kg)   SpO2 96%   BMI 21.46 kg/m    Physical Exam Vitals and nursing note reviewed.  Constitutional:      General: She is not in acute distress.    Appearance: Normal appearance.  HENT:     Right Ear: Tympanic membrane is erythematous.     Left Ear: Tympanic membrane normal.     Nose:     Right Sinus: Frontal sinus tenderness present. No maxillary sinus tenderness.     Left Sinus: No maxillary sinus tenderness or frontal sinus tenderness.     Mouth/Throat:     Pharynx: Uvula midline. No oropharyngeal exudate or posterior oropharyngeal erythema.  Cardiovascular:     Rate and Rhythm: Normal rate and regular rhythm.     Heart sounds: Normal heart sounds.  Pulmonary:     Effort: Pulmonary effort is normal.     Breath sounds: Normal breath sounds.  Skin:    General: Skin is warm and dry.  Neurological:     General: No focal deficit present.     Mental Status: She is alert. Mental status is at baseline.  Psychiatric:        Mood and Affect: Mood normal.        Behavior: Behavior normal.        Thought Content: Thought content  normal.        Judgment: Judgment normal.     No results found for any visits on 03/30/24.      Assessment & Plan:   Problem List Items Addressed This Visit     Primary insomnia   Courtesy refill sent. PDMP reviewed: no red flags.       Relevant Medications   zolpidem  (AMBIEN ) 10 MG tablet   Non-recurrent acute suppurative otitis media of right ear without spontaneous rupture of tympanic membrane - Primary   Right ear pain since Friday. Right TM with erythema with some external tenderness.  Azithromycin  500 mg today, followed by 250 mg daily for days 2-5. Ibuprofen or tylenol  for pain. Heating pad may help the external pain. Avoid burns to skin. Follow-up if symptoms do not resolve.        Relevant Medications   azithromycin  (ZITHROMAX ) 250 MG tablet    Meds ordered this encounter  Medications   zolpidem  (AMBIEN ) 10 MG tablet    Sig: Take 1 tablet (10 mg total) by  mouth at bedtime as needed for sleep.    Dispense:  30 tablet    Refill:  0   azithromycin  (ZITHROMAX ) 250 MG tablet    Sig: Take 2 tablets on day 1, then 1 tablet daily on days 2 through 5    Dispense:  6 tablet    Refill:  0    Supervising Provider:   METHENEY, CATHERINE D [[id number]]  Agrees with plan of care discussed.  Questions answered.   Return if symptoms worsen or fail to improve.  [name], FNP

## 2024-03-30 NOTE — Assessment & Plan Note (Addendum)
 Right ear pain since Friday. Right TM with erythema with some external tenderness.  Azithromycin  500 mg today, followed by 250 mg daily for days 2-5. Ibuprofen or tylenol  for pain. Heating pad may help the external pain. Avoid burns to skin. Follow-up if symptoms do not resolve.

## 2024-03-30 NOTE — Assessment & Plan Note (Signed)
 Courtesy refill sent. PDMP reviewed: no red flags.

## 2024-03-31 ENCOUNTER — Other Ambulatory Visit: Payer: Self-pay | Admitting: Internal Medicine

## 2024-04-14 ENCOUNTER — Other Ambulatory Visit: Payer: Self-pay | Admitting: Family Medicine

## 2024-04-14 DIAGNOSIS — F411 Generalized anxiety disorder: Secondary | ICD-10-CM

## 2024-04-14 MED ORDER — ESCITALOPRAM OXALATE 5 MG PO TABS: 5.0000 mg | ORAL_TABLET | Freq: Every day | ORAL | 0 refills | Status: AC

## 2024-04-14 NOTE — Telephone Encounter (Signed)
 Copied from CRM [id number]. Topic: Clinical - Medication Refill >> Apr 14, 2024  9:23 AM Erin [name] wrote: Medication:  escitalopram  (LEXAPRO ) 5 MG tablet- patient only has one pill left. PLEASE ADVISE.    Has the patient contacted their pharmacy? Yes (Agent: If no, request that the patient contact the pharmacy for the refill. If patient does not wish to contact the pharmacy document the reason why and proceed with request.) (Agent: If yes, when and what did the pharmacy advise?)  This is the patient's preferred pharmacy:  [id number] - [place], [place] - [address] AT [facility] [address] [place] [address] Phone: [phone] Fax: [phone]    Is this the correct pharmacy for this prescription? Yes If no, delete pharmacy and type the correct one.   Has the prescription been filled recently? Yes  Is the patient out of the medication? Yes  Has the patient been seen for an appointment in the last year OR does the patient have an upcoming appointment? Yes  Can we respond through MyChart? Yes  Agent: Please be advised that Rx refills may take up to 3 business days. We ask that you follow-up with your pharmacy.

## 2024-04-15 NOTE — Progress Notes (Unsigned)
 "    Vada Barrier, MD Reason for referral-SVT/palpitations  HPI: 63 year old female for evaluation of SVT/palpitations at request of Raylene Barrier, MD.  Echocardiogram April 2018 showed normal LV function and mild aortic insufficiency.  Monitor October 2025 showed sinus rhythm with 1 run of SVT lasting 14 seconds, rare PACs and PVCs.  Cardiology now asked to evaluate.  Current Outpatient Medications  Medication Sig Dispense Refill   albuterol  (PROVENTIL ) (2.5 MG/3ML) 0.083% nebulizer solution Take 3 mLs (2.5 mg total) by nebulization every 6 (six) hours as needed for wheezing or shortness of breath. 75 mL 12   albuterol  (VENTOLIN  HFA) 108 (90 Base) MCG/ACT inhaler USE 1 TO 2 INHALATIONS BY MOUTH  EVERY 6 HOURS AS NEEDED FOR  WHEEZING OR SHORTNESS OF BREATH 26.8 g 4   BREZTRI  AEROSPHERE 160-9-4.8 MCG/ACT AERO inhaler INHALE 2 INHALATIONS BY MOUTH  INTO THE LUNGS TWICE DAILY IN  THE MORNING AND AT BEDTIME 32.1 g 3   Cholecalciferol (VITAMIN D3) 25 MCG (1000 UT) capsule Take 1 capsule (1,000 Units total) by mouth daily. 90 capsule 0   clonazePAM  (KLONOPIN ) 0.5 MG tablet TAKE 1/2 TO 1 TABLET(0.25 TO 0.5 MG) BY MOUTH TWICE DAILY AS NEEDED FOR ANXIETY 60 tablet 1   dicyclomine (BENTYL) 20 MG tablet Take 20 mg by mouth 2 (two) times daily.     escitalopram  (LEXAPRO ) 5 MG tablet Take 1 tablet (5 mg total) by mouth daily. 90 tablet 0   famotidine (PEPCID) 20 MG tablet Take 20 mg by mouth daily.     LINZESS  145 MCG CAPS capsule TAKE 1 CAPSULE(145 MCG) BY MOUTH DAILY 30 capsule 0   Magnesium 400 MG TABS Take by mouth 2 (two) times daily.     naproxen  (NAPROSYN ) 500 MG tablet Take 1 tablet (500 mg total) by mouth 2 (two) times daily with a meal. 30 tablet 0   omeprazole  (PRILOSEC) 40 MG capsule Take 1 capsule (40 mg total) by mouth daily. 90 capsule 3   ondansetron  (ZOFRAN  ODT) 8 MG disintegrating tablet Take 1 tablet (8 mg total) by mouth every 8 (eight) hours as needed for nausea or vomiting.  24 tablet 0   zolpidem  (AMBIEN ) 10 MG tablet Take 1 tablet (10 mg total) by mouth at bedtime as needed for sleep. 30 tablet 0   No current facility-administered medications for this visit.    Allergies[1]   Past Medical History:  Diagnosis Date   Allergy    Anxiety    Arthritis    hands and feet   ASCUS (atypical squamous cells of undetermined significance) on Pap smear    Blood transfusion 1983   In [place]   BRCA2 gene mutation positive 2014   Bursitis    Cancer (HCC)    liver   Chronic constipation    Condyloma    COPD (chronic obstructive pulmonary disease) (HCC)    Depression    per pt h/o depression- no meds currently. Dx of bi-polar depression in EPIC   Emphysema of lung (HCC)    GERD (gastroesophageal reflux disease)    Headache(784.0)    migraines   Hot flashes, menopausal    HPV (human papilloma virus) infection    Lung nodule, multiple    Osteopenia    Osteoporosis 01/22/2018   Seizures (HCC)    unknown reason for seizures- last seizurein Sept 2012   Stroke [facility])    TIA (transient ischemic attack)     Past Surgical History:  Procedure Laterality Date   ABDOMINAL  HYSTERECTOMY     ABDOMINOPLASTY     BREAST SURGERY     CHOLECYSTECTOMY     HERNIA REPAIR     LAPAROSCOPY  01/04/2011   Procedure: LAPAROSCOPY OPERATIVE;  Surgeon: Harland C. Starla, MD;  Location: [facility];  Service: Gynecology;  Laterality: N/A;   LAPAROSCOPY  09/14/2012   gall bladder and 1/2 liver   SALPINGOOPHORECTOMY  01/04/2011   Procedure: SALPINGO OOPHERECTOMY;  Surgeon: Myra C. Starla, MD;  Location: [facility];  Service: Gynecology;  Laterality: Bilateral;   TOTAL VAGINAL HYSTERECTOMY  63 yrs old    Social History   Socioeconomic History   Marital status: Divorced    Spouse name: Not on file   Number of children: 2   Years of education: 12th grade   Highest education level: 12th grade  Occupational History   Occupation: Disabled.  Tobacco Use   Smoking status: Every Day    Current  packs/day: 0.50    Average packs/day: 0.5 packs/day for 52.6 years (26.3 ttl pk-yrs)    Types: Cigarettes    Start date: 09/16/1971   Smokeless tobacco: Never   Tobacco comments:    0.5 pack a day  Vaping Use   Vaping status: Never Used  Substance and Sexual Activity   Alcohol use: No   Drug use: Not Currently    Types: Cocaine, Marijuana    Comment: history of use   Sexual activity: Not Currently    Partners: Male  Other Topics Concern   Not on file  Social History Narrative   Lives alone with her cat. She has two children. She enjoys watching t.v.   Social Drivers of Health   Tobacco Use: High Risk (03/30/2024)   Patient History    Smoking Tobacco Use: Every Day    Smokeless Tobacco Use: Never    Passive Exposure: Not on file  Financial Resource Strain: Low Risk (03/30/2024)   Overall Financial Resource Strain (CARDIA)    Difficulty of Paying Living Expenses: Not hard at all  Recent Concern: Financial Resource Strain - Medium Risk (02/20/2024)   Received from [facility]   Overall Financial Resource Strain (CARDIA)    How hard is it for you to pay for the very basics like food, housing, medical care, and heating?: Somewhat hard  Food Insecurity: No Food Insecurity (03/30/2024)   Epic    Worried About Radiation Protection Practitioner of Food in the Last Year: Never true    Ran Out of Food in the Last Year: Never true  Transportation Needs: No Transportation Needs (03/30/2024)   Epic    Lack of Transportation (Medical): No    Lack of Transportation (Non-Medical): No  Physical Activity: Inactive (03/30/2024)   Exercise Vital Sign    Days of Exercise per Week: 0 days    Minutes of Exercise per Session: Not on file  Stress: No Stress Concern Present (03/30/2024)   [employer] of Occupational Health - Occupational Stress Questionnaire    Feeling of Stress: Only a little  Recent Concern: Stress - Stress Concern Present (02/20/2024)   Received from [facility]  Occupational Health - Occupational Stress Questionnaire    Do you feel stress - tense, restless, nervous, or anxious, or unable to sleep at night because your mind is troubled all the time - these days?: To some extent  Social Connections: Socially Isolated (03/30/2024)   Social Connection and Isolation Panel    Frequency of Communication with Friends and Family: Never  Frequency of Social Gatherings with Friends and Family: Never    Attends Religious Services: Never    Database Administrator or Organizations: No    Attends Engineer, Structural: Not on file    Marital Status: Divorced  Intimate Partner Violence: Not At Risk (02/20/2024)   Received from [facility]   HITS    Over the last 12 months how often did your partner physically hurt you?: Never    Over the last 12 months how often did your partner insult you or talk down to you?: Never    Over the last 12 months how often did your partner threaten you with physical harm?: Never    Over the last 12 months how often did your partner scream or curse at you?: Never  Depression (PHQ2-9): Low Risk (03/30/2024)   Depression (PHQ2-9)    PHQ-2 Score: 4  Alcohol Screen: Low Risk (03/30/2024)   Alcohol Screen    Last Alcohol Screening Score (AUDIT): 1  Housing: Low Risk (03/30/2024)   Epic    Unable to Pay for Housing in the Last Year: No    Number of Times Moved in the Last Year: 0    Homeless in the Last Year: No  Utilities: Not At Risk (02/20/2024)   Received from [facility]    In the past 12 months has the electric, gas, oil, or water company threatened to shut off services in your home?: No  Health Literacy: Adequate Health Literacy (06/26/2023)   B1300 Health Literacy    Frequency of need for help with medical instructions: Never    Family History  Problem Relation Age of Onset   Breast cancer Mother        postmenopausal   Depression Mother    Drug abuse Mother    Drug abuse Father    Lung cancer  Father    Drug abuse Sister    Breast cancer Sister    Alcohol abuse Daughter    Breast cancer Daughter    Breast cancer Maternal Aunt    Ovarian cancer Neg Hx    Uterine cancer Neg Hx     ROS: no fevers or chills, productive cough, hemoptysis, dysphasia, odynophagia, melena, hematochezia, dysuria, hematuria, rash, seizure activity, orthopnea, PND, pedal edema, claudication. Remaining systems are negative.  Physical Exam:   There were no vitals taken for this visit.  General:  Well developed/well nourished in NAD Skin warm/dry Patient not depressed No peripheral clubbing Back-normal HEENT-normal/normal eyelids Neck supple/normal carotid upstroke bilaterally; no bruits; no JVD; no thyromegaly chest - CTA/ normal expansion CV - RRR/normal S1 and S2; no murmurs, rubs or gallops;  PMI nondisplaced Abdomen -NT/ND, no HSM, no mass, + bowel sounds, no bruit 2+ femoral pulses, no bruits Ext-no edema, chords, 2+ DP Neuro-grossly nonfocal  ECG - personally reviewed  A/P  1 palpitations-  Redell Shallow, MD     [1]  Allergies Allergen Reactions   Bee Venom Anaphylaxis   Dicyclomine Shortness Of Breath    SHOB, swelling of tongue   Butalbital-Apap-Caffeine Other (See Comments)    Abd. pain   Singulair  [Montelukast Sodium] Other (See Comments)    Sinus infection for months   Ciprofloxacin  Nausea And Vomiting   Tylenol  [Acetaminophen ] Other (See Comments)    Can not take due to patient's liver cancer and resection.   Amoxicillin Diarrhea    Severe diarrhea   Lyrica  [Pregabalin ] Itching   "

## 2024-04-23 ENCOUNTER — Telehealth: Admitting: Physician Assistant

## 2024-04-23 DIAGNOSIS — Z20828 Contact with and (suspected) exposure to other viral communicable diseases: Secondary | ICD-10-CM

## 2024-04-23 DIAGNOSIS — R6889 Other general symptoms and signs: Secondary | ICD-10-CM

## 2024-04-23 MED ORDER — OSELTAMIVIR PHOSPHATE 75 MG PO CAPS: 75.0000 mg | ORAL_CAPSULE | Freq: Two times a day (BID) | ORAL | 0 refills | Status: AC

## 2024-04-23 MED ORDER — ONDANSETRON 4 MG PO TBDP: 4.0000 mg | ORAL_TABLET | Freq: Three times a day (TID) | ORAL | 0 refills | Status: AC | PRN
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
## 2024-04-23 NOTE — Patient Instructions (Addendum)
 " Alfonso Fitting, thank you for joining [name], PA-C for today's virtual visit.  While this provider is not your primary care provider (PCP), if your PCP is located in our provider database this encounter information will be shared with them immediately following your visit.   A [facility] MyChart account gives you access to today's visit and all your visits, tests, and labs performed at [facility]  click here if you don't have a [facility] MyChart account or go to [email or web address]  Consent: (Patient) Laquandra Carrillo provided verbal consent for this virtual visit at the beginning of the encounter.  Current Medications:  Current Outpatient Medications:    albuterol  (PROVENTIL ) (2.5 MG/3ML) 0.083% nebulizer solution, Take 3 mLs (2.5 mg total) by nebulization every 6 (six) hours as needed for wheezing or shortness of breath., Disp: 75 mL, Rfl: 12   albuterol  (VENTOLIN  HFA) 108 (90 Base) MCG/ACT inhaler, USE 1 TO 2 INHALATIONS BY MOUTH  EVERY 6 HOURS AS NEEDED FOR  WHEEZING OR SHORTNESS OF BREATH, Disp: 26.8 g, Rfl: 4   BREZTRI  AEROSPHERE 160-9-4.8 MCG/ACT AERO inhaler, INHALE 2 INHALATIONS BY MOUTH  INTO THE LUNGS TWICE DAILY IN  THE MORNING AND AT BEDTIME, Disp: 32.1 g, Rfl: 3   Cholecalciferol (VITAMIN D3) 25 MCG (1000 UT) capsule, Take 1 capsule (1,000 Units total) by mouth daily., Disp: 90 capsule, Rfl: 0   clonazePAM  (KLONOPIN ) 0.5 MG tablet, TAKE 1/2 TO 1 TABLET(0.25 TO 0.5 MG) BY MOUTH TWICE DAILY AS NEEDED FOR ANXIETY, Disp: 60 tablet, Rfl: 1   dicyclomine (BENTYL) 20 MG tablet, Take 20 mg by mouth 2 (two) times daily., Disp: , Rfl:    escitalopram  (LEXAPRO ) 5 MG tablet, Take 1 tablet (5 mg total) by mouth daily., Disp: 90 tablet, Rfl: 0   famotidine (PEPCID) 20 MG tablet, Take 20 mg by mouth daily., Disp: , Rfl:    LINZESS  145 MCG CAPS capsule, TAKE 1 CAPSULE(145 MCG) BY MOUTH DAILY, Disp: 30 capsule, Rfl: 0   Magnesium 400 MG TABS, Take by mouth 2 (two)  times daily., Disp: , Rfl:    naproxen  (NAPROSYN ) 500 MG tablet, Take 1 tablet (500 mg total) by mouth 2 (two) times daily with a meal., Disp: 30 tablet, Rfl: 0   omeprazole  (PRILOSEC) 40 MG capsule, Take 1 capsule (40 mg total) by mouth daily., Disp: 90 capsule, Rfl: 3   ondansetron  (ZOFRAN  ODT) 8 MG disintegrating tablet, Take 1 tablet (8 mg total) by mouth every 8 (eight) hours as needed for nausea or vomiting., Disp: 24 tablet, Rfl: 0   zolpidem  (AMBIEN ) 10 MG tablet, Take 1 tablet (10 mg total) by mouth at bedtime as needed for sleep., Disp: 30 tablet, Rfl: 0   Medications ordered in this encounter:  No orders of the defined types were placed in this encounter.    *If you need refills on other medications prior to your next appointment, please contact your pharmacy*  Follow-Up: Call back or seek an in-person evaluation if the symptoms worsen or if the condition fails to improve as anticipated.  [facility] [phone]  Other Instructions Please keep well-hydrated and try to get plenty of rest. If you have a humidifier, place it in the bedroom and run it at night. Start a saline nasal rinse for nasal congestion. You can consider use of a nasal steroid spray like Flonase  or Nasacort  OTC. You can alternate between Tylenol  and Ibuprofen if needed for fever, body aches, headache and/or throat pain.  Salt water-gargles and chloraseptic spray can be very beneficial for sore throat. Mucinex-DM for congestion or cough. Please take all prescribed medications as directed.  Remain out of work until [employer] for 24 hours without a fever-reducing medication, and you are feeling better.  You should mask until symptoms are resolved.  If anything worsens despite treatment, you need to be evaluated in-person. Please do not delay care.  Influenza, Adult Influenza is also called the flu. It is an infection in the lungs, nose, and throat (respiratory tract). It spreads easily from  person to person (is contagious). The flu causes symptoms that are like a cold, along with high fever and body aches. What are the causes? This condition is caused by the influenza virus. You can get the virus by: Breathing in droplets that are in the air after a person infected with the flu coughed or sneezed. Touching something that has the virus on it and then touching your mouth, nose, or eyes. What increases the risk? Certain things may make you more likely to get the flu. These include: Not washing your hands often. Having close contact with many people during cold and flu season. Touching your mouth, eyes, or nose without first washing your hands. Not getting a flu shot every year. You may have a higher risk for the flu, and serious problems, such as a lung infection (pneumonia), if you: Are older than 65. Are pregnant. Have a weakened disease-fighting system (immune system) because of a disease or because you are taking certain medicines. Have a long-term (chronic) condition, such as: Heart, kidney, or lung disease. Diabetes. Asthma. Have a liver disorder. Are very overweight (morbidly obese). Have anemia. What are the signs or symptoms? Symptoms usually begin suddenly and last 4-14 days. They may include: Fever and chills. Headaches, body aches, or muscle aches. Sore throat. Cough. Runny or stuffy (congested) nose. Feeling discomfort in your chest. Not wanting to eat as much as normal. Feeling weak or tired. Feeling dizzy. Feeling sick to your stomach or throwing up. How is this treated? If the flu is found early, you can be treated with antiviral medicine. This can help to reduce how bad the illness is and how long it lasts. This may be given by mouth or through an IV tube. Taking care of yourself at home can help your symptoms get better. Your doctor may want you to: Take over-the-counter medicines. Drink plenty of fluids. The flu often goes away on its own. If you  have very bad symptoms or other problems, you may be treated in a hospital. Follow these instructions at home:     Activity Rest as needed. Get plenty of sleep. Stay home from work or school as told by your doctor. Do not leave home until you do not have a fever for 24 hours without taking medicine. Leave home only to go to your doctor. Eating and drinking Take an ORS (oral rehydration solution). This is a drink that is sold at pharmacies and stores. Drink enough fluid to keep your pee pale yellow. Drink clear fluids in small amounts as you are able. Clear fluids include: Water. Ice chips. Fruit juice mixed with water. Low-calorie sports drinks. Eat bland foods that are easy to digest. Eat small amounts as you are able. These foods include: Bananas. Applesauce. Rice. Lean meats. Toast. Crackers. Do not eat or drink: Fluids that have a lot of sugar or caffeine. Alcohol. Spicy or fatty foods. General instructions Take over-the-counter and prescription medicines only as  told by your doctor. Use a cool mist humidifier to add moisture to the air in your home. This can make it easier for you to breathe. When using a cool mist humidifier, clean it daily. Empty water and replace with clean water. Cover your mouth and nose when you cough or sneeze. Wash your hands with soap and water often and for at least 20 seconds. This is also important after you cough or sneeze. If you cannot use soap and water, use alcohol-based hand sanitizer. Keep all follow-up visits. How is this prevented?  Get a flu shot every year. You may get the flu shot in late summer, fall, or winter. Ask your doctor when you should get your flu shot. Avoid contact with people who are sick during fall and winter. This is cold and flu season. Contact a doctor if: You get new symptoms. You have: Chest pain. Watery poop (diarrhea). A fever. Your cough gets worse. You start to have more mucus. You feel sick to your  stomach. You throw up. Get help right away if you: Have shortness of breath. Have trouble breathing. Have skin or nails that turn a bluish color. Have very bad pain or stiffness in your neck. Get a sudden headache. Get sudden pain in your face or ear. Cannot eat or drink without throwing up. These symptoms may represent a serious problem that is an emergency. Get medical help right away. Call your local emergency services (911 in the [place] Do not wait to see if the symptoms will go away. Do not drive yourself to the hospital. Summary Influenza is also called the flu. It is an infection in the lungs, nose, and throat. It spreads easily from person to person. Take over-the-counter and prescription medicines only as told by your doctor. Getting a flu shot every year is the best way to not get the flu. This information is not intended to replace advice given to you by your health care provider. Make sure you discuss any questions you have with your health care provider. Document Revised: 11/20/2019 Document Reviewed: 11/20/2019 Elsevier Patient Education  2023 Elsevier Inc.   If you have been instructed to have an in-person evaluation today at a local Urgent Care facility, please use the link below. It will take you to a list of all of our available [facility], including address, phone number and hours of operation. Please do not delay care.  [facility]  If you or a family member do not have a primary care provider, use the link below to schedule a visit and establish care. When you choose a [facility] primary care physician or advanced practice provider, you gain a long-term partner in health. Find a Primary Care Provider  Learn more about [facility] in-office and virtual care options: [facility] Get Care Now  "

## 2024-04-24 ENCOUNTER — Ambulatory Visit: Payer: Self-pay

## 2024-04-24 NOTE — Telephone Encounter (Signed)
 FYI Only or Action Required?: Action required by provider: clinical question for provider and update on patient condition.  Patient was last seen in primary care on 04/23/2024 by Erin Elsie BROCKS, PA-C.  Called Nurse Triage reporting Vomiting.  Symptoms began several days ago.  Interventions attempted: Prescription medications: tamiflu , zofran , Rescue inhaler 3-4x/day with little relief, Rest, hydration, or home remedies, Dietary changes, and Other: tea with honey and lemon, gatorade.  Symptoms are: rapidly worsening.  Triage Disposition: Go to ED Now (or PCP Triage)  Patient/caregiver understands and will follow disposition?: No, refuses disposition         Copied from CRM [id number]. Topic: Clinical - Red Word Triage >> Apr 24, 2024  1:34 PM Wess RAMAN wrote: Red Word that prompted transfer to Nurse Triage: Vomiting so much that her throat hurts. She has the flu. She would like to know if she should still come in for her appt on Tuesday, 04/28/24 Reason for Disposition  High-risk adult (e.g., diabetes mellitus, brain tumor, V-P shunt, hernia)  Answer Assessment - Initial Assessment Questions This RN recommended pt be examined in hospital, pt refusing - asking if she should stay home from previously scheduled Tuesday appt. Advised pt call 911 or get to hospital asap if any new or worsening symptoms. Sending message to PCP office for call back to pt with further recommendations. Alerted [facility] to ED refusal.   Symptoms: SOB more than usual at rest - waking her up from sleep Vomiting Sore throat Excessive sweating Headache Not eaten in 2 days, no appetite Dizziness Weakness - not too weak to stand at present  Denies: Cold/clammy skin, heart racing Chest pain Vomiting up fluids  Pt reporting she's able to keep fluids down  Protocols used: Vomiting-A-AH

## 2024-04-27 ENCOUNTER — Ambulatory Visit: Admitting: Cardiology

## 2024-04-28 ENCOUNTER — Ambulatory Visit (INDEPENDENT_AMBULATORY_CARE_PROVIDER_SITE_OTHER): Admitting: Family Medicine

## 2024-04-28 ENCOUNTER — Encounter: Payer: Self-pay | Admitting: Family Medicine

## 2024-04-28 VITALS — BP 138/84 | HR 71 | Temp 98.2°F | Ht 67.0 in | Wt 137.0 lb

## 2024-04-28 DIAGNOSIS — F411 Generalized anxiety disorder: Secondary | ICD-10-CM | POA: Diagnosis not present

## 2024-04-28 DIAGNOSIS — F5101 Primary insomnia: Secondary | ICD-10-CM | POA: Diagnosis not present

## 2024-04-28 DIAGNOSIS — J111 Influenza due to unidentified influenza virus with other respiratory manifestations: Secondary | ICD-10-CM | POA: Diagnosis not present

## 2024-04-28 MED ORDER — ZOLPIDEM TARTRATE 10 MG PO TABS: 10.0000 mg | ORAL_TABLET | Freq: Every evening | ORAL | 1 refills | Status: AC | PRN

## 2024-04-28 MED ORDER — BENZONATATE 200 MG PO CAPS: 200.0000 mg | ORAL_CAPSULE | Freq: Two times a day (BID) | ORAL | 0 refills | Status: AC | PRN

## 2024-04-28 MED ORDER — CLONAZEPAM 0.5 MG PO TABS: 0.5000 mg | ORAL_TABLET | Freq: Two times a day (BID) | ORAL | 1 refills | Status: AC | PRN

## 2024-04-28 NOTE — Progress Notes (Signed)
 s  Established Patient Office Visit  Subjective   Patient ID: Erin [name], female    DOB: 1961-02-16  Age: 64 y.o. MRN: [id number]  Chief Complaint  Patient presents with   Medication Refill   Emesis    Has been on tamiflu  for 5 days - feeling better but still very week   Cough    States she might need something stronger    Medication Refill Associated symptoms include coughing and vomiting.  Emesis  Associated symptoms include coughing and diarrhea.  Cough   Pt was exposed to flu on 1/8. Had telehealth visit and given Tamiflu . Today is last day. She is having cough and diarrhea. She requests something for cough. Pt also having diarrhea. Staying hydrated. Denies chest pains or SOB.  Pt needs refills on her anxiety and insomnia medicines. Working well for her.  (Klonopin  0.5mg  bid prn and Ambien  10mg  at bedtime prn)  Review of Systems  Respiratory:  Positive for cough.   Gastrointestinal:  Positive for diarrhea and vomiting.  All other systems reviewed and are negative.     Objective:     BP (!) 144/81 (BP Location: Left Arm, Patient Position: Sitting, Cuff Size: Small)   Pulse 71   Temp 98.2 F (36.8 C) (Oral)   Ht 5' 7 (1.702 m)   Wt 137 lb (62.1 kg)   SpO2 95%   BMI 21.46 kg/m    Physical Exam Vitals and nursing note reviewed.  Constitutional:      Appearance: Normal appearance. She is normal weight.  HENT:     Head: Normocephalic and atraumatic.     Right Ear: External ear normal.     Left Ear: External ear normal.     Nose: Nose normal.     Mouth/Throat:     Mouth: Mucous membranes are moist.     Pharynx: Oropharynx is clear.  Eyes:     Conjunctiva/sclera: Conjunctivae normal.     Pupils: Pupils are equal, round, and reactive to light.  Cardiovascular:     Rate and Rhythm: Normal rate and regular rhythm.     Pulses: Normal pulses.     Heart sounds: Normal heart sounds.  Pulmonary:     Effort: Pulmonary effort is normal.     Breath sounds:  Normal breath sounds.  Abdominal:     General: Abdomen is flat. Bowel sounds are normal.  Skin:    General: Skin is warm.     Capillary Refill: Capillary refill takes less than 2 seconds.  Neurological:     General: No focal deficit present.     Mental Status: She is alert and oriented to person, place, and time. Mental status is at baseline.  Psychiatric:        Mood and Affect: Mood normal.        Behavior: Behavior normal.        Thought Content: Thought content normal.        Judgment: Judgment normal.     No results found for any visits on 04/28/24.    The ASCVD Risk score (Arnett DK, et al., 2019) failed to calculate for the following reasons:   Risk score cannot be calculated because patient has a medical history suggesting prior/existing ASCVD   * - Cholesterol units were assumed    Assessment & Plan:   Problem List Items Addressed This Visit       Other   Generalized anxiety disorder   Primary insomnia   Flu syndrome -  Benzonatate ; Take 1 capsule (200 mg total) by mouth 2 (two) times daily as needed for cough.  Dispense: 20 capsule; Refill: 0  Generalized anxiety disorder -     clonazePAM ; Take 1 tablet (0.5 mg total) by mouth 2 (two) times daily as needed for anxiety.  Dispense: 60 tablet; Refill: 1  Primary insomnia -     Zolpidem  Tartrate; Take 1 tablet (10 mg total) by mouth at bedtime as needed for sleep.  Dispense: 30 tablet; Refill: 1   Stay hydrated and drink plenty of fluids. Sent in Tessalon  perles to usebid prn for cough.  Refilled Klonopin  0.5mg  bID prn and Ambien  10mg  nightly prn.  No follow-ups on file.    [facility] CINDERELLA Barrier, MD

## 2024-07-02 ENCOUNTER — Ambulatory Visit

## 2024-07-27 ENCOUNTER — Ambulatory Visit: Admitting: Family Medicine
